# Patient Record
Sex: Male | Born: 1935 | Race: White | Hispanic: No | Smoking: Former smoker
Health system: Southern US, Community
[De-identification: ages and names within clinical notes are randomized; demographics above are authoritative.]

## PROBLEM LIST (undated history)

## (undated) DIAGNOSIS — Z9889 Other specified postprocedural states: Secondary | ICD-10-CM

## (undated) DIAGNOSIS — E872 Acidosis, unspecified: Secondary | ICD-10-CM

## (undated) DIAGNOSIS — E039 Hypothyroidism, unspecified: Secondary | ICD-10-CM

## (undated) DIAGNOSIS — C679 Malignant neoplasm of bladder, unspecified: Secondary | ICD-10-CM

## (undated) DIAGNOSIS — N189 Chronic kidney disease, unspecified: Secondary | ICD-10-CM

## (undated) DIAGNOSIS — I2510 Atherosclerotic heart disease of native coronary artery without angina pectoris: Secondary | ICD-10-CM

## (undated) DIAGNOSIS — K567 Ileus, unspecified: Secondary | ICD-10-CM

## (undated) DIAGNOSIS — R195 Other fecal abnormalities: Secondary | ICD-10-CM

## (undated) DIAGNOSIS — D649 Anemia, unspecified: Secondary | ICD-10-CM

## (undated) DIAGNOSIS — N400 Enlarged prostate without lower urinary tract symptoms: Secondary | ICD-10-CM

## (undated) DIAGNOSIS — M1990 Unspecified osteoarthritis, unspecified site: Secondary | ICD-10-CM

## (undated) DIAGNOSIS — T4145XA Adverse effect of unspecified anesthetic, initial encounter: Secondary | ICD-10-CM

## (undated) DIAGNOSIS — E889 Metabolic disorder, unspecified: Secondary | ICD-10-CM

## (undated) DIAGNOSIS — T8859XA Other complications of anesthesia, initial encounter: Secondary | ICD-10-CM

## (undated) DIAGNOSIS — T8452XA Infection and inflammatory reaction due to internal left hip prosthesis, initial encounter: Secondary | ICD-10-CM

## (undated) DIAGNOSIS — I219 Acute myocardial infarction, unspecified: Secondary | ICD-10-CM

## (undated) DIAGNOSIS — R011 Cardiac murmur, unspecified: Secondary | ICD-10-CM

## (undated) DIAGNOSIS — M898X9 Other specified disorders of bone, unspecified site: Secondary | ICD-10-CM

## (undated) DIAGNOSIS — Z5189 Encounter for other specified aftercare: Secondary | ICD-10-CM

## (undated) DIAGNOSIS — C801 Malignant (primary) neoplasm, unspecified: Secondary | ICD-10-CM

## (undated) HISTORY — DX: Infection and inflammatory reaction due to internal left hip prosthesis, initial encounter: T84.52XA

## (undated) HISTORY — DX: Other fecal abnormalities: R19.5

## (undated) HISTORY — PX: CATARACT EXTRACTION W/ INTRAOCULAR LENS  IMPLANT, BILATERAL: SHX1307

## (undated) HISTORY — DX: Other specified postprocedural states: Z98.890

## (undated) HISTORY — DX: Benign prostatic hyperplasia without lower urinary tract symptoms: N40.0

## (undated) HISTORY — PX: CHOLECYSTECTOMY: SHX55

## (undated) HISTORY — PX: CORONARY ANGIOPLASTY: SHX604

---

## 1997-07-26 ENCOUNTER — Other Ambulatory Visit: Admission: RE | Admit: 1997-07-26 | Discharge: 1997-07-26 | Payer: Self-pay | Admitting: Cardiology

## 1998-12-30 ENCOUNTER — Ambulatory Visit (HOSPITAL_COMMUNITY): Admission: RE | Admit: 1998-12-30 | Discharge: 1998-12-30 | Payer: Self-pay | Admitting: Family Medicine

## 1998-12-30 ENCOUNTER — Encounter: Payer: Self-pay | Admitting: Family Medicine

## 1999-01-30 DIAGNOSIS — K567 Ileus, unspecified: Secondary | ICD-10-CM

## 1999-01-30 HISTORY — DX: Ileus, unspecified: K56.7

## 1999-05-24 ENCOUNTER — Encounter: Payer: Self-pay | Admitting: Orthopedic Surgery

## 1999-05-29 ENCOUNTER — Encounter: Payer: Self-pay | Admitting: Orthopedic Surgery

## 1999-05-29 ENCOUNTER — Inpatient Hospital Stay (HOSPITAL_COMMUNITY): Admission: RE | Admit: 1999-05-29 | Discharge: 1999-06-06 | Payer: Self-pay | Admitting: Orthopedic Surgery

## 1999-05-30 ENCOUNTER — Encounter: Payer: Self-pay | Admitting: Orthopedic Surgery

## 1999-06-01 ENCOUNTER — Encounter: Payer: Self-pay | Admitting: Internal Medicine

## 1999-06-03 ENCOUNTER — Encounter: Payer: Self-pay | Admitting: Orthopedic Surgery

## 1999-06-05 ENCOUNTER — Encounter: Payer: Self-pay | Admitting: Orthopedic Surgery

## 2000-04-09 ENCOUNTER — Encounter: Payer: Self-pay | Admitting: Orthopedic Surgery

## 2000-04-15 ENCOUNTER — Encounter: Payer: Self-pay | Admitting: Orthopedic Surgery

## 2000-04-15 ENCOUNTER — Inpatient Hospital Stay (HOSPITAL_COMMUNITY): Admission: RE | Admit: 2000-04-15 | Discharge: 2000-04-20 | Payer: Self-pay | Admitting: Orthopedic Surgery

## 2003-01-30 HISTORY — PX: INGUINAL HERNIA REPAIR: SHX194

## 2006-01-29 DIAGNOSIS — Z5189 Encounter for other specified aftercare: Secondary | ICD-10-CM

## 2006-01-29 DIAGNOSIS — IMO0001 Reserved for inherently not codable concepts without codable children: Secondary | ICD-10-CM

## 2006-01-29 HISTORY — PX: BLADDER SURGERY: SHX569

## 2006-01-29 HISTORY — DX: Reserved for inherently not codable concepts without codable children: IMO0001

## 2006-01-29 HISTORY — DX: Encounter for other specified aftercare: Z51.89

## 2010-07-25 ENCOUNTER — Ambulatory Visit (INDEPENDENT_AMBULATORY_CARE_PROVIDER_SITE_OTHER): Payer: Medicare Other | Admitting: Internal Medicine

## 2010-07-25 DIAGNOSIS — K921 Melena: Secondary | ICD-10-CM

## 2010-07-25 DIAGNOSIS — R112 Nausea with vomiting, unspecified: Secondary | ICD-10-CM

## 2010-08-08 NOTE — Consult Note (Signed)
[name], NABOZNY NO.:  [id number]  MEDICAL RECORD NO.:  [id number]  LOCATION:                                 FACILITY:  PHYSICIAN:  Lionel December, M.D.    DATE OF BIRTH:  [birth year]  DATE OF CONSULTATION:  07/25/2010 DATE OF DISCHARGE:                                CONSULTATION   This is  Clinic for GI disease.  REASON FOR CONSULTATION:  Blood in stool.  HISTORY OF PRESENT ILLNESS:  Casey [name] is a 77 year old male, referred to our office for guaiac-positive stool.  He was recently seen at Dr. Bonnita Levan office in May of this year.  He presented for wellness physical, and it was noted his stool was guaiac positive.  Harnoor also complains of diarrhea off and on for 18 years.  He takes Imodium two a day.  The diarrhea has progressively worsened.  He tells me when he has to go to have a bowel movement, he has to go right then or he will be incontinent.  He has been told in the past that he has a spastic colon. His appetite is good.  There has been no weight loss.  No fatigue.  He usually has four stools a day.  He tells me the first stool is formed and then his stools progressively soften and become loose. He has tried fiber in the past, but this causes his diarrhea to increase.  He has also tried Questran in the past and it helps at first, but then it loses its effect. Casey [name] underwent an EGD and colonoscopy on April 12, 1999 by Dr. Karilyn Cota, the EGD revealed gastritis with antral scar, prepyloric erosions, and a very friable mucosa.  Colonoscopy, he had two cecal ulcers.  Ulcer at the ileocecal valve and also the terminal ileum showed changes of inflammation.  It was suspected these changes could be due to chronic NSAID therapy.  Given these findings of colonoscopy, Crohn disease needs to be considered in the differential diagnosis.  ALLERGIES:  He is allergic to SULFA.  PAST SURGICAL HISTORY:  He had bladder cancer surgery 4 years ago.  He has also has  a ureteral stent.  He has had a right hip replacement in 2001 and a left knee replacement in 2002.  He had angioplasty 14 years ago.  He had a cholecystectomy in 1995 or 1996.  PAST MEDICAL HISTORY:  Bleeding ulcer.  He has had two MIs in the past. He has been a diabetic for 12 years.  FAMILY HISTORY:  His mother is deceased from an MI at age 55.  Father is deceased with a history of an MI, but died from complications of COPD. He has two sisters, one is alive with arthritis, one is deceased from cervical cancer.  No brothers.  SOCIAL HISTORY:  He is widowed.  He is retired.  He does not smoke, drink, or do drugs.  He has three children in good health.  HOME MEDICATIONS: 1. Zetia 10 mg one a day. 2. Avodart 0.5 mg a day. 3. Toprol-XL 50 mg one half tablet daily. 4. Amaryl 4 mg a day. 5. Celebrex 200 mg one a day. 6. Flomax  0.4 mg daily. 7. Aspirin 81 mg a day.  OBJECTIVE:  VITAL SIGNS:  His weight is 182.9, his height is 5 feet 6.5 inches, his temperature is 97.1, his blood pressure is 106/50, his pulse is 72. HEENT:  He has natural teeth.  His oral mucosa is moist.  His conjunctivae is pink.  His sclerae are anicteric.  His thyroid is normal.  There is no cervical lymphadenopathy. LUNGS:  Clear. HEART:  Regular rate and rhythm. ABDOMEN:  Obese.  Bowel sounds are positive.  No masses felt.  He does have a prominent vein to his left abdomen.  I did not feel any masses. Guaiac  negative.  I did not have any stool to guaiac, just the mucosa.  I was unable to palpate his liver.  ASSESSMENT:  Casey [name] is a 77 year old male with a heme-positive stool and diarrhea.  He has history of cecal ulcers. Need to IBD.  RECOMMENDATIONS:  We will schedule a colonoscopy in near future.  The risk and benefits were reviewed with the patient and he is agreeable.   He will take one Imodium a day, and he may try Levsin 0.125 mg sublingual every 6 hours as needed to see if this will help.  Thank you  for allowing [place] to participate in the care of this very nice gentleman.    ______________________________ Dorene Ar, NP   ______________________________ Lionel December, M.D.    TS/MEDQ  D:  07/25/2010  T:  07/26/2010  Job:  [id number]  cc:   Dr. Sherril Croon  Electronically Signed by Dorene Ar PA on 07/28/2010 10:36:42 AM Electronically Signed by Lionel December M.D. on 08/08/2010 09:41:58 PM

## 2010-09-06 DIAGNOSIS — K633 Ulcer of intestine: Secondary | ICD-10-CM

## 2010-09-06 DIAGNOSIS — K921 Melena: Secondary | ICD-10-CM

## 2010-09-06 DIAGNOSIS — K56609 Unspecified intestinal obstruction, unspecified as to partial versus complete obstruction: Secondary | ICD-10-CM

## 2010-09-06 DIAGNOSIS — D126 Benign neoplasm of colon, unspecified: Secondary | ICD-10-CM

## 2010-09-06 DIAGNOSIS — D649 Anemia, unspecified: Secondary | ICD-10-CM

## 2010-09-06 DIAGNOSIS — K5289 Other specified noninfective gastroenteritis and colitis: Secondary | ICD-10-CM

## 2010-09-06 MED ORDER — SODIUM CHLORIDE 0.45 % IV SOLN
Freq: Once | INTRAVENOUS | Status: AC
  Administered 2010-09-07: 13:00:00 via INTRAVENOUS

## 2010-09-07 ENCOUNTER — Encounter (HOSPITAL_COMMUNITY): Payer: Self-pay | Admitting: *Deleted

## 2010-09-07 ENCOUNTER — Encounter (INDEPENDENT_AMBULATORY_CARE_PROVIDER_SITE_OTHER): Payer: Medicare Other | Admitting: Internal Medicine

## 2010-09-07 ENCOUNTER — Ambulatory Visit (HOSPITAL_COMMUNITY)
Admission: RE | Admit: 2010-09-07 | Discharge: 2010-09-07 | Disposition: A | Discharge status: Home / Self Care | Payer: Medicare Other | Source: Ambulatory Visit | Attending: Internal Medicine | Admitting: Internal Medicine

## 2010-09-07 ENCOUNTER — Encounter (HOSPITAL_COMMUNITY)
Admission: RE | Disposition: A | Discharge status: Home / Self Care | Payer: Self-pay | Source: Ambulatory Visit | Attending: Internal Medicine

## 2010-09-07 ENCOUNTER — Other Ambulatory Visit (INDEPENDENT_AMBULATORY_CARE_PROVIDER_SITE_OTHER): Payer: Self-pay | Admitting: Internal Medicine

## 2010-09-07 DIAGNOSIS — K633 Ulcer of intestine: Secondary | ICD-10-CM | POA: Insufficient documentation

## 2010-09-07 DIAGNOSIS — Z79899 Other long term (current) drug therapy: Secondary | ICD-10-CM | POA: Insufficient documentation

## 2010-09-07 DIAGNOSIS — D126 Benign neoplasm of colon, unspecified: Secondary | ICD-10-CM | POA: Insufficient documentation

## 2010-09-07 DIAGNOSIS — Z7982 Long term (current) use of aspirin: Secondary | ICD-10-CM | POA: Insufficient documentation

## 2010-09-07 DIAGNOSIS — D509 Iron deficiency anemia, unspecified: Secondary | ICD-10-CM | POA: Insufficient documentation

## 2010-09-07 DIAGNOSIS — K921 Melena: Secondary | ICD-10-CM | POA: Insufficient documentation

## 2010-09-07 DIAGNOSIS — I10 Essential (primary) hypertension: Secondary | ICD-10-CM | POA: Insufficient documentation

## 2010-09-07 DIAGNOSIS — E119 Type 2 diabetes mellitus without complications: Secondary | ICD-10-CM | POA: Insufficient documentation

## 2010-09-07 HISTORY — DX: Adverse effect of unspecified anesthetic, initial encounter: T41.45XA

## 2010-09-07 HISTORY — PX: COLONOSCOPY: SHX5424

## 2010-09-07 HISTORY — DX: Chronic kidney disease, unspecified: N18.9

## 2010-09-07 HISTORY — DX: Malignant (primary) neoplasm, unspecified: C80.1

## 2010-09-07 HISTORY — DX: Acute myocardial infarction, unspecified: I21.9

## 2010-09-07 HISTORY — DX: Cardiac murmur, unspecified: R01.1

## 2010-09-07 HISTORY — DX: Ileus, unspecified: K56.7

## 2010-09-07 HISTORY — DX: Unspecified osteoarthritis, unspecified site: M19.90

## 2010-09-07 HISTORY — DX: Encounter for other specified aftercare: Z51.89

## 2010-09-07 HISTORY — DX: Other complications of anesthesia, initial encounter: T88.59XA

## 2010-09-07 SURGERY — COLONOSCOPY
Anesthesia: Moderate Sedation

## 2010-09-07 MED ORDER — MEPERIDINE HCL 50 MG/ML IJ SOLN
INTRAMUSCULAR | Status: DC | PRN
  Administered 2010-09-07 (×2): 25 mg via INTRAVENOUS

## 2010-09-07 MED ORDER — MIDAZOLAM HCL 5 MG/5ML IJ SOLN
INTRAMUSCULAR | Status: AC
  Filled 2010-09-07: qty 10

## 2010-09-07 MED ORDER — MEPERIDINE HCL 50 MG/ML IJ SOLN
INTRAMUSCULAR | Status: AC
  Filled 2010-09-07: qty 1

## 2010-09-07 MED ORDER — MIDAZOLAM HCL 5 MG/5ML IJ SOLN
INTRAMUSCULAR | Status: DC | PRN
  Administered 2010-09-07 (×2): 2 mg via INTRAVENOUS

## 2010-09-07 NOTE — Op Note (Signed)
COLONOSCOPY PROCEDURE REPORT  PATIENT:  Casey [name]  MR#:  [id number] Birthdate:  March 01, 1929, 77 y.o., male Endoscopist:  Dr. Malissa Hippo, MD Referred By:  Dr. Dr. Doreen Beam. MD Procedure Date: 09/07/2010  Procedure:   Colonoscopy  Indications: Heme positive stool and mild anemia. Patient also complains of intermittent diarrhea not mentioned in history and physical.  Informed Consent:  Procedure and risks were reviewed with the patient and informed exam was obtained.  Medications:  Demerol 50 mg IV Versed 4 mg IV  Description of procedure:  After a digital rectal exam was performed, that colonoscope was advanced from the anus through the rectum and colon to the area of the cecum, ileocecal valve and appendiceal orifice. The cecum was deeply intubated. These structures were well-seen and photographed for the record. From the level of the cecum and ileocecal valve, the scope was slowly and cautiously withdrawn. The mucosal surfaces were carefully surveyed utilizing scope tip to flexion to facilitate fold flattening as needed. The scope was pulled down into the rectum where a thorough exam including retroflexion was performed. Short segment of terminal ileum was also examined.   Findings:   Prep excellent. Small ulcer involving ileocecal valve along with scarring. 7-8 mm polyp appeared to be originating from terminal ileum. It was snared in 2 pieces. Distal centimeters of ileal mucosa with edema erythema and friability with narrowing. Was not able to pass the scope is segment able to see mucosa above it which was normal. Biopsy taken from  terminal ileum. Mucosa of colon and rectum was normal.  Therapeutic/Diagnostic Maneuvers Performed:  See above.  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 Unable to pass pediatric scope across it. Biopsy taken.  7-8 mm polyp snared from  terminal ileum/ ileocecal valve. Single ulcer in the background of scarring at ileocecal valve. Suspect these changes are secondary to chronic NSAID use. Recommendations:  No aspirin for 10 days. Stop Celebrex and discuss alternatives to Dr. Sherril Croon. Physician will contact you with biopsy results.  Anberlyn Feimster U  09/07/2010 2:17 PM  CC: Dr. Ignatius Specking., MD, MD

## 2010-09-07 NOTE — H&P (Signed)
Casey [name] is an 77 y.o. male.   Chief Complaint: Patient is here for colonoscopy he has heme positive stools and mild anemia HPI: This 77 year old Caucasian male who was noted to have heme positive stool and he also has mild anemia. He states his hemoglobin has been in the mid 12 range for few years. There is no history of melena or rectal bleeding abdominal pain heartburn or dysphagia. He has a poor appetite and has not lost any weight. His last colonoscopy was in 2001 by me revealing the ulcer at ileocecal valve and terminal ileum.to be secondary to NSAID use. He has arthritis and gout and is on Celebrex takes 200 mg daily. Family history is negative for colorectal carcinoma.  Past Medical History  Diagnosis Date  . Complication of anesthesia   . Ileus 2001    post hip replacement  . Heart murmur   . Chronic kidney disease     bladder ca in 2008;  . Cancer     bladder  . Myocardial infarction age 18  . Hypertension   . Blood transfusion 2008  . Arthritis     "all over" all joints  . Diabetes mellitus     Past Surgical History  Procedure Date  . Coronary angioplasty   . Bladder surgery 2008  . Cholecystectomy   . Inguinal hernia repair 2005    History reviewed. No pertinent family history. Social History:  reports that he quit smoking about 20 years ago. He does not have any smokeless tobacco history on file. He reports that he does not drink alcohol or use illicit drugs.  Allergies:  Allergies  Allergen Reactions  . Sulfa Antibiotics     Medications Prior to Admission  Medication Dose Route Frequency Provider Last Rate Last Dose  . 0.45 % sodium chloride infusion   Intravenous Once Malissa Hippo, MD 20 mL/hr at 09/07/10 1310    . meperidine (DEMEROL) 50 MG/ML injection           . midazolam (VERSED) 5 MG/5ML injection            Medications Prior to Admission  Medication Sig Dispense Refill  . aspirin 81 MG EC tablet Take 81 mg by mouth daily.        .  celecoxib (CELEBREX) 200 MG capsule Take 200 mg by mouth 2 (two) times daily.        . diphenhydrAMINE (SOMINEX) 25 MG tablet Take 25 mg by mouth at bedtime as needed.        . dutasteride (AVODART) 0.5 MG capsule Take 0.5 mg by mouth daily.        Marland Kitchen ezetimibe (ZETIA) 10 MG tablet Take 10 mg by mouth daily.        Marland Kitchen glimepiride (AMARYL) 4 MG tablet Take 4 mg by mouth daily before breakfast.        . metoprolol succinate (TOPROL-XL) 25 MG 24 hr tablet Take 25 mg by mouth daily.        . Tamsulosin HCl (FLOMAX) 0.4 MG CAPS Take by mouth.          No results found for this or any previous visit (from the past 48 hour(s)). No results found.  ROS  Blood pressure 141/80, pulse 84, temperature 98.6 F (37 C), temperature source Oral, resp. rate 18, height 5\' 7"  (1.702 m), weight 176 lb (79.833 kg), SpO2 96.00%. Physical Exam  Constitutional: He appears well-developed and well-nourished.  HENT:  Mouth/Throat: Oropharynx is clear  and moist.  Eyes: Conjunctivae are normal. No scleral icterus.  Neck: No thyromegaly present.  Cardiovascular: Normal rate, regular rhythm and normal heart sounds.   No murmur heard. Respiratory: Breath sounds normal.  GI: Soft. He exhibits no distension and no mass. There is no tenderness.  Musculoskeletal: He exhibits no edema.  Lymphadenopathy:    He has no cervical adenopathy.  Neurological: He is alert.  Skin: Skin is warm and dry.     Assessment/Plan Heme positive stools and mild anemia. Patient is here for diagnostic colonoscopy. He has history of ulcers at ileocecal valve and terminal ileum (secondary to NSAID therapy ; last colonoscopy of 2001)  Kirra Verga U 09/07/2010, 1:35 PM

## 2010-09-13 ENCOUNTER — Encounter (INDEPENDENT_AMBULATORY_CARE_PROVIDER_SITE_OTHER): Payer: Self-pay | Admitting: *Deleted

## 2010-09-14 ENCOUNTER — Encounter (HOSPITAL_COMMUNITY): Payer: Self-pay | Admitting: Internal Medicine

## 2010-11-27 ENCOUNTER — Encounter (INDEPENDENT_AMBULATORY_CARE_PROVIDER_SITE_OTHER): Payer: Self-pay | Admitting: *Deleted

## 2010-12-07 ENCOUNTER — Ambulatory Visit (INDEPENDENT_AMBULATORY_CARE_PROVIDER_SITE_OTHER): Payer: Medicare Other | Admitting: Internal Medicine

## 2012-08-11 ENCOUNTER — Ambulatory Visit (INDEPENDENT_AMBULATORY_CARE_PROVIDER_SITE_OTHER): Payer: Medicare Other | Admitting: Internal Medicine

## 2012-08-11 ENCOUNTER — Encounter (INDEPENDENT_AMBULATORY_CARE_PROVIDER_SITE_OTHER): Payer: Self-pay | Admitting: Internal Medicine

## 2012-08-11 VITALS — BP 126/70 | HR 80 | Temp 97.9°F | Ht 66.5 in | Wt 172.9 lb

## 2012-08-11 DIAGNOSIS — E119 Type 2 diabetes mellitus without complications: Secondary | ICD-10-CM

## 2012-08-11 DIAGNOSIS — R197 Diarrhea, unspecified: Secondary | ICD-10-CM

## 2012-08-11 DIAGNOSIS — I25810 Atherosclerosis of coronary artery bypass graft(s) without angina pectoris: Secondary | ICD-10-CM

## 2012-08-11 DIAGNOSIS — E118 Type 2 diabetes mellitus with unspecified complications: Secondary | ICD-10-CM

## 2012-08-11 DIAGNOSIS — E7800 Pure hypercholesterolemia, unspecified: Secondary | ICD-10-CM | POA: Insufficient documentation

## 2012-08-11 HISTORY — DX: Diarrhea, unspecified: R19.7

## 2012-08-11 HISTORY — DX: Atherosclerosis of coronary artery bypass graft(s) without angina pectoris: I25.810

## 2012-08-11 HISTORY — DX: Type 2 diabetes mellitus with unspecified complications: E11.8

## 2012-08-11 HISTORY — DX: Pure hypercholesterolemia, unspecified: E78.00

## 2012-08-11 MED ORDER — DIPHENOXYLATE-ATROPINE 2.5-0.025 MG PO TABS: 1.0000 | ORAL_TABLET | Freq: Four times a day (QID) | ORAL | Status: DC | PRN

## 2012-08-11 NOTE — Patient Instructions (Addendum)
Lomotil # 60. OV prn

## 2012-08-11 NOTE — Progress Notes (Signed)
Subjective:     Patient ID: Casey [name], male   DOB: [birth year], 79 y.o.   MRN: [id number]  HPI Here today with c/o that he had a bug 2 weeks ago and had diarrhea x 2 weeks. He c/o urgency.  He would go to the BR about 13 times at its worse. He was just passing water. He also had a fever. He started [place] yogurt  and states he has not had any diarrhea. He has had 3 stools today which are now better. They is no urgency. No melena or bright red rectal bleeding. He tells me he is taking a big trip and he is going across country. He is asking for an Rx for Lomotil. He tells me Imodium does not help. Appetite is good. No weight loss. No abdominal pain . No melena or bright red rectal bleeding. Diabetes 2 since 2004. HA1C 5.1  Colonoscopy:  8/9/Cecal Withdrawal Time: 25 minutes  Impression:  Focal ileitis with narrowing involving distal 2 cm of terminal ileum. Unable to pass pediatric scope across it. Biopsy taken.  7-8 mm polyp snared from terminal ileum/ ileocecal valve.  Single ulcer in the background of scarring at ileocecal valve.  Suspect these changes are secondary to chronic NSAID use.  Recommendations:  No aspirin for 10 days.  Stop Celebrex and discuss alternatives to Dr. Sherril Croon.  Biopsy:   Notes Recorded by Malissa Hippo, MD on 09/12/2010 at 5:14 PM Biopsy results reviewed with the patient. Polyp is inflammatory. Ileal ulceration without features of Crohn's disease. Suspect this is secondary to NSAID therapy Review of Systems  Current Outpatient Prescriptions  Medication Sig Dispense Refill  . dutasteride (AVODART) 0.5 MG capsule Take 0.5 mg by mouth daily.        Marland Kitchen glimepiride (AMARYL) 4 MG tablet Take 4 mg by mouth daily before breakfast.        . metoprolol succinate (TOPROL-XL) 25 MG 24 hr tablet Take 25 mg by mouth daily. 1/2 tab daily      . Multiple Vitamin (MULTIVITAMIN) tablet Take 1 tablet by mouth daily.      . Tamsulosin HCl (FLOMAX) 0.4 MG CAPS Take by mouth.          No current facility-administered medications for this visit.   Past Medical History  Diagnosis Date  . Complication of anesthesia   . Ileus 2001    post hip replacement  . Heart murmur   . Chronic kidney disease     bladder ca in 2008;  . Cancer     bladder  . Myocardial infarction age 30  . Blood transfusion 2008  . Arthritis     "all over" all joints  . Diabetes mellitus    Past Surgical History  Procedure Laterality Date  . Coronary angioplasty    . Bladder surgery  2008  . Cholecystectomy    . Inguinal hernia repair  2005  . Colonoscopy  09/07/2010    Procedure: COLONOSCOPY;  Surgeon: Malissa Hippo, MD;  Location: [facility];  Service: Endoscopy;  Laterality: N/A;   Allergies  Allergen Reactions  . Sulfa Antibiotics   '      Objective:   Physical Exam  Filed Vitals:   08/11/12 1448  BP: 126/70  Pulse: 80  Temp: 97.9 F (36.6 C)  Height: 5' 6.5" (1.689 m)  Weight: 172 lb 14.4 oz (78.427 kg)   Alert and oriented. Skin warm and dry. Oral mucosa is moist.   . Sclera  anicteric, conjunctivae is pink. Thyroid not enlarged. No cervical lymphadenopathy. Lungs clear. Heart regular rate and rhythm.  Abdomen is soft. Bowel sounds are positive. No hepatomegaly. No abdominal masses felt. No tenderness.  No edema to lower extremities.      Assessment:    Viral gastroenteritis resolved at this time. No fever.    Plan:    Lomitil # 60 every 12 hrs as needed. OV prn

## 2014-09-14 ENCOUNTER — Other Ambulatory Visit: Payer: Self-pay | Admitting: Physical Medicine and Rehabilitation

## 2014-09-14 DIAGNOSIS — M545 Low back pain: Secondary | ICD-10-CM

## 2014-09-23 ENCOUNTER — Ambulatory Visit
Admission: RE | Admit: 2014-09-23 | Discharge: 2014-09-23 | Disposition: A | Discharge status: Home / Self Care | Payer: Medicare Other | Source: Ambulatory Visit | Attending: Physical Medicine and Rehabilitation | Admitting: Physical Medicine and Rehabilitation

## 2014-09-23 DIAGNOSIS — M545 Low back pain: Secondary | ICD-10-CM

## 2015-06-21 ENCOUNTER — Encounter (INDEPENDENT_AMBULATORY_CARE_PROVIDER_SITE_OTHER): Payer: Self-pay | Admitting: *Deleted

## 2015-07-12 ENCOUNTER — Ambulatory Visit (INDEPENDENT_AMBULATORY_CARE_PROVIDER_SITE_OTHER): Payer: Medicare Other | Admitting: Internal Medicine

## 2015-10-17 ENCOUNTER — Ambulatory Visit (HOSPITAL_COMMUNITY)
Admission: RE | Admit: 2015-10-17 | Discharge: 2015-10-17 | Disposition: A | Discharge status: Home / Self Care | Payer: Medicare Other | Source: Ambulatory Visit | Attending: Surgery | Admitting: Surgery

## 2015-10-17 ENCOUNTER — Other Ambulatory Visit (HOSPITAL_COMMUNITY): Payer: Self-pay | Admitting: Nephrology

## 2015-10-17 DIAGNOSIS — I701 Atherosclerosis of renal artery: Secondary | ICD-10-CM | POA: Diagnosis not present

## 2015-10-17 DIAGNOSIS — N183 Chronic kidney disease, stage 3 (moderate): Secondary | ICD-10-CM | POA: Diagnosis not present

## 2017-04-23 ENCOUNTER — Telehealth (INDEPENDENT_AMBULATORY_CARE_PROVIDER_SITE_OTHER): Payer: Self-pay | Admitting: Physical Medicine and Rehabilitation

## 2017-04-23 NOTE — Telephone Encounter (Signed)
Called patient and left msg #1.

## 2017-05-07 NOTE — Telephone Encounter (Signed)
Will await call from patient.

## 2017-06-28 ENCOUNTER — Telehealth (INDEPENDENT_AMBULATORY_CARE_PROVIDER_SITE_OTHER): Payer: Self-pay | Admitting: *Deleted

## 2017-06-28 ENCOUNTER — Ambulatory Visit (INDEPENDENT_AMBULATORY_CARE_PROVIDER_SITE_OTHER): Payer: Medicare Other | Admitting: Internal Medicine

## 2017-06-28 ENCOUNTER — Encounter (INDEPENDENT_AMBULATORY_CARE_PROVIDER_SITE_OTHER): Payer: Self-pay | Admitting: Internal Medicine

## 2017-06-28 ENCOUNTER — Encounter (INDEPENDENT_AMBULATORY_CARE_PROVIDER_SITE_OTHER): Payer: Self-pay | Admitting: *Deleted

## 2017-06-28 DIAGNOSIS — R195 Other fecal abnormalities: Secondary | ICD-10-CM

## 2017-06-28 HISTORY — DX: Other fecal abnormalities: R19.5

## 2017-06-28 MED ORDER — PEG 3350-KCL-NA BICARB-NACL 420 G PO SOLR: 4000.0000 mL | Freq: Once | ORAL | 0 refills | Status: AC

## 2017-06-28 NOTE — Patient Instructions (Signed)
The risks of bleeding, perforation and infection were reviewed with patient.  

## 2017-06-28 NOTE — Telephone Encounter (Signed)
Patient needs trilyte 

## 2017-06-28 NOTE — Progress Notes (Signed)
   Subjective:    Patient ID: Casey [name], male    DOB: Jun 03, 1929, 84 y.o.   MRN: [id number]  HPI Referred by Dr. Woody Seller for positive stool card/colonoscopy. Denies seeing any blood in his stool. No weight loss. No family hx of colon cancer. Appetite is good. No weight loss. Last colonoscopy in 2012. No change in his bowel habits.  No NSAIDs except for ASA twice a week (81mg )   09/06/2010:   Colonoscopy  Indications: Heme positive stool and mild anemia. Patient also complains of intermittent diarrhea not mentioned in history and physical. Impression:  Focal  ileitis with narrowing involving  distal 2 cm of terminal ileum. Unable to pass pediatric scope across it. Biopsy taken.  7-8 mm polyp snared from terminal ileum/ ileocecal valve. Single ulcer in the background of scarring at ileocecal valve. Suspect these changes are secondary to chronic NSAID use.  Review of Systems Past Medical History:  Diagnosis Date  . Arthritis    "all over" all joints  . Blood transfusion 2008  . Cancer ([place])    bladder  . Chronic kidney disease    bladder ca in 2008;  Marland Kitchen Complication of anesthesia   . Diabetes mellitus   . Guaiac + stool 06/28/2017  . Heart murmur   . Ileus ([place]) 2001   post hip replacement  . Myocardial infarction [facility]) age 44    Past Surgical History:  Procedure Laterality Date  . BLADDER SURGERY  2008  . CHOLECYSTECTOMY    . COLONOSCOPY  09/07/2010   Procedure: COLONOSCOPY;  Surgeon: Rogene Houston, MD;  Location: [facility];  Service: Endoscopy;  Laterality: N/A;  . CORONARY ANGIOPLASTY    . INGUINAL HERNIA REPAIR  2005    Allergies  Allergen Reactions  . Sulfa Antibiotics     Current Outpatient Medications on File Prior to Visit  Medication Sig Dispense Refill  . aspirin EC 81 MG tablet Take 81 mg by mouth. Takes one twice a week    . Choline Fenofibrate (FENOFIBRIC ACID) 135 MG CPDR Take by mouth.    . dutasteride (AVODART) 0.5 MG capsule Take 0.5 mg by  mouth daily.      Marland Kitchen glimepiride (AMARYL) 4 MG tablet Take 4 mg by mouth daily before breakfast.      . lisinopril (PRINIVIL,ZESTRIL) 10 MG tablet Take 10 mg by mouth daily.    . metoprolol succinate (TOPROL-XL) 25 MG 24 hr tablet Take 25 mg by mouth daily. 1/2 tab daily    . Multiple Vitamin (MULTIVITAMIN) tablet Take 1 tablet by mouth daily.    . Tamsulosin HCl (FLOMAX) 0.4 MG CAPS Take by mouth.       No current facility-administered medications on file prior to visit.         Objective:   Physical Exam Blood pressure (!) 142/70, pulse 64, temperature 97.6 F (36.4 C), height 5\' 7"  (1.702 m), weight 175 lb (79.4 kg). Alert and oriented. Skin warm and dry. Oral mucosa is moist.   . Sclera anicteric, conjunctivae is pink. Thyroid not enlarged. No cervical lymphadenopathy. Lungs clear. Heart regular rate and rhythm.  Abdomen is soft. Bowel sounds are positive. No hepatomegaly. No abdominal masses felt. No tenderness.  No edema to lower extremities.          Assessment & Plan:  Guaiac positive stool. Colonic neoplasm needs to be ruled out. Colonoscopy. The risks of bleeding, perforation and infection were reviewed with patient.

## 2017-08-21 ENCOUNTER — Ambulatory Visit (HOSPITAL_COMMUNITY): Admit: 2017-08-21 | Payer: Medicare Other | Admitting: Internal Medicine

## 2017-08-21 ENCOUNTER — Encounter (HOSPITAL_COMMUNITY): Payer: Self-pay

## 2017-08-21 SURGERY — COLONOSCOPY
Anesthesia: Moderate Sedation

## 2018-12-03 DIAGNOSIS — M1612 Unilateral primary osteoarthritis, left hip: Secondary | ICD-10-CM

## 2018-12-03 HISTORY — DX: Unilateral primary osteoarthritis, left hip: M16.12

## 2018-12-03 NOTE — H&P (Signed)
HIP ARTHROPLASTY ADMISSION H&P  Patient ID: Casey [name] MRN: [id number] DOB/AGE: May 20, 1929 85 y.o.  Chief Complaint: left hip pain.  Planned Procedure Date: 12/23/2018 Medical Clearance by Dr. Woody Seller     Additional clearance by nephrology: Dr. Lowanda Foster  HPI: Casey [name] is a 85 y.o. male with a history of bladder cancer, CKD, DM, HTN, and MI at 85 years old - status post CABG who presents for evaluation of OA LEFT HIP. The patient has a history of pain and functional disability in the left hip due to arthritis and has failed non-surgical conservative treatments for greater than 12 weeks to include NSAID's and/or analgesics, corticosteriod injections and activity modification.  Onset of symptoms was gradual, starting 1+ years ago with gradually worsening course since that time.  Patient currently rates pain at 8 out of 10 with activity. Patient has night pain, worsening of pain with activity and weight bearing and pain that interferes with activities of daily living.  Patient has evidence of periarticular osteophytes and joint space narrowing by imaging studies.  There is no active infection.  Past Medical History:  Diagnosis Date  . Arthritis    "all over" all joints  . Blood transfusion 2008  . Cancer ([place])    bladder  . Chronic kidney disease    bladder ca in 2008;  Marland Kitchen Complication of anesthesia   . Diabetes mellitus   . Guaiac + stool 06/28/2017  . Heart murmur   . Ileus ([place]) 2001   post hip replacement  . Myocardial infarction [facility]) age 35   Past Surgical History:  Procedure Laterality Date  . BLADDER SURGERY  2008  . CHOLECYSTECTOMY    . COLONOSCOPY  09/07/2010   Procedure: COLONOSCOPY;  Surgeon: Rogene Houston, MD;  Location[facility];  Service: Endoscopy;  Laterality: N/A;  . CORONARY ANGIOPLASTY    . INGUINAL HERNIA REPAIR  2005   Allergies  Allergen Reactions  . Sulfa Antibiotics    Prior to Admission medications   Medication Sig Start Date End Date  Taking? Authorizing Provider  aspirin EC 81 MG tablet Take 81 mg by mouth. Takes one twice a week    [provider]  Choline Fenofibrate (FENOFIBRIC ACID) 135 MG CPDR Take by mouth.    [provider]  dutasteride (AVODART) 0.5 MG capsule Take 0.5 mg by mouth daily.      [provider]  glimepiride (AMARYL) 4 MG tablet Take 4 mg by mouth daily before breakfast.      [provider]  lisinopril (PRINIVIL,ZESTRIL) 10 MG tablet Take 10 mg by mouth daily.    [provider]  metoprolol succinate (TOPROL-XL) 25 MG 24 hr tablet Take 25 mg by mouth daily. 1/2 tab daily    [provider]  Multiple Vitamin (MULTIVITAMIN) tablet Take 1 tablet by mouth daily.    [provider]  Tamsulosin HCl (FLOMAX) 0.4 MG CAPS Take by mouth.      [provider]   Social history: Widowed former smoker-quit 1992.  Occasional beer.  Family history: Mother with MI, HTN, DM.  Sister w/ Cancer.   Grandparents w/ heart dz and DM.  ROS: Currently denies lightheadedness, dizziness, Fever, chills, CP, SOB.   No personal history of DVT, PE, or CVA. No loose teeth or dentures All other systems have been reviewed and were otherwise currently negative with the exception of those mentioned in the HPI and as above.  Objective: Vitals: Ht: 5'3" Wt: 167  Temp: 97.6 BP: 163/70 Pulse: 67 O2 97% on room air.   Physical Exam: General: Alert, NAD. Trendelenberg Gait  HEENT: EOMI, Good Neck Extension  Pulm: No increased work of breathing.  Clear B/L A/P w/o crackle or wheeze.  CV: RRR, No m/g/r appreciated  GI: soft, NT, ND Neuro: Neuro without gross focal deficit.  Sensation intact distally Skin: No lesions in the area of chief complaint MSK/Surgical Site: Left Hip pain with passive ROM.  Positive Stinchfield.  5/5 strength.  NVI.    Imaging Review Plain radiographs demonstrate severe degenerative joint disease of the left hip.   Preoperative templating  of the joint replacement has been completed, documented, and submitted to the Operating Room personnel in order to optimize intra-operative equipment management.  Assessment: OA LEFT HIP Principal Problem:   Primary osteoarthritis of left hip Active Problems:   CAD (coronary artery disease) of artery bypass graft   Diabetes (HCC)   High cholesterol   Plan: Plan for Procedure(s): TOTAL HIP ARTHROPLASTY ANTERIOR APPROACH  The patient history, physical exam, clinical judgement of the provider and imaging are consistent with end stage degenerative joint disease and total joint arthroplasty is deemed medically necessary. The treatment options including medical management, injection therapy, and arthroplasty were discussed at length. The risks and benefits of Procedure(s): TOTAL HIP ARTHROPLASTY ANTERIOR APPROACH were presented and reviewed.  The risks of nonoperative treatment, versus surgical intervention including but not limited to continued pain, aseptic loosening, stiffness, dislocation/subluxation, infection, bleeding, nerve injury, blood clots, cardiopulmonary complications, morbidity, mortality, among others were discussed. The patient verbalizes understanding and wishes to proceed with the plan.  Patient is being admitted for surgery, pain control, PT, prophylactic antibiotics, VTE prophylaxis, progressive ambulation, ADL's and discharge planning.   Dental prophylaxis discussed and recommended for 2 years postoperatively.   The patient does  meet the criteria for TXA which will be used perioperatively.    ASA 81 mg BID will be used postoperatively for DVT prophylaxis in addition to SCDs, and early ambulation.  Plan for Norco for pain.  No NSAID due CKD. Baclofen for spasm.    The patient is planning to be discharged home with HHPT ([facility]) in care of his daughter.   Anticipated LOS less than 2 midnights.  Insurance approval for inpatient due to: - Age 73 and older with one or  more of the following:  - Expected need for hospital services (PT, OT, Nursing) required for safe  discharge    - Active co-morbidities: Diabetes and Coronary Artery Disease   Prudencio Burly III, PA-C 12/03/2018 3:13 PM

## 2018-12-11 ENCOUNTER — Encounter (HOSPITAL_COMMUNITY): Payer: Self-pay

## 2018-12-11 NOTE — Patient Instructions (Addendum)
DUE TO COVID-19 ONLY ONE VISITOR IS ALLOWED TO COME WITH YOU AND STAY IN THE WAITING ROOM ONLY DURING PRE OP AND PROCEDURE. THE ONE VISITOR MAY VISIT WITH YOU IN YOUR PRIVATE ROOM DURING VISITING HOURS ONLY!!   COVID SWAB TESTING MUST BE COMPLETED ON: Friday, Nov. 20, 2020 at   11:40 AM   [address] [place], [place] - the short stay covered drive at [facility] (Use the [employer] entrance to [facility] next to [facility] (Must self quarantine after testing. Follow instructions on handout.)       Your procedure is scheduled on: Tuesday, Nov. 24, 2020   Report to [facility] Main  Entrance   Report to Short Stay at 5:30 AM   Call this number if you have problems the morning of surgery [phone]   Do not eat food:After Midnight.   May have liquids until 4:30 AM day of surgery   CLEAR LIQUID DIET  Foods Allowed                                                                     Foods Excluded  Water, Black Coffee and tea, regular and decaf                             liquids that you cannot  Plain Jell-O in any flavor  (No red)                                           see through such as: Fruit ices (not with fruit pulp)                                     milk, soups, orange juice  Iced Popsicles (No red)                                    All solid food Carbonated beverages, regular and diet                                    Apple juices Sports drinks like Gatorade (No red) Lightly seasoned clear broth or consume(fat free) Sugar, honey syrup  Sample Menu Breakfast                                Lunch                                     Supper Cranberry juice                    Beef broth  Chicken broth Jell-O                                     Grape juice                           Apple juice Coffee or tea                        Jell-O                                      Popsicle                                                 Coffee or tea                        Coffee or tea   Complete one G2 drink the morning of surgery at 4:30 AM the day of surgery.   Brush your teeth the morning of surgery.   Do NOT smoke after Midnight   Take these medicines the morning of surgery with A SIP OF WATER: Finasteride, Synthroid, Metoprolol, Tamsulosin,   Monday, Nov. 23, 2020 Take morning dose of Glimepiride per normal routine  DO NOT TAKE ANY DIABETIC MEDICATIONS DAY OF YOUR SURGERY                               You may not have any metal on your body including jewelry, and body piercings             Do not wear lotions, powders, perfumes/cologne, or deodorant                           Men may shave face and neck.   Do not bring valuables to the hospital. [place].   Contacts, dentures or bridgework may not be worn into surgery.   Bring small overnight bag day of surgery.    Special Instructions: Bring a copy of your healthcare power of attorney and living will documents         the day of surgery if you haven't scanned them in before.              Please read over the following fact sheets you were given:  [facility] Main - Preparing for Surgery Before surgery, you can play an important role.  Because skin is not sterile, your skin needs to be as free of germs as possible.  You can reduce the number of germs on your skin by washing with CHG (chlorahexidine gluconate) soap before surgery.  CHG is an antiseptic cleaner which kills germs and bonds with the skin to continue killing germs even after washing. Please DO NOT use if you have an allergy to CHG or antibacterial soaps.  If your skin becomes reddened/irritated stop using the CHG and inform your nurse when you arrive at Short  Stay. Do not shave (including legs and underarms) for at least 48 hours prior to the first CHG shower.  You may shave your face/neck.  Please follow these instructions  carefully:  1.  Shower with CHG Soap the night before surgery and the  morning of surgery.  2.  If you choose to wash your hair, wash your hair first as usual with your normal  shampoo.  3.  After you shampoo, rinse your hair and body thoroughly to remove the shampoo.                             4.  Use CHG as you would any other liquid soap.  You can apply chg directly to the skin and wash.  Gently with a scrungie or clean washcloth.  5.  Apply the CHG Soap to your body ONLY FROM THE NECK DOWN.   Do   not use on face/ open                           Wound or open sores. Avoid contact with eyes, ears mouth and   genitals (private parts).                       Wash face,  Genitals (private parts) with your normal soap.             6.  Wash thoroughly, paying special attention to the area where your    surgery  will be performed.  7.  Thoroughly rinse your body with warm water from the neck down.  8.  DO NOT shower/wash with your normal soap after using and rinsing off the CHG Soap.                9.  Pat yourself dry with a clean towel.            10.  Wear clean pajamas.            11.  Place clean sheets on your bed the night of your first shower and do not  sleep with pets. Day of Surgery : Do not apply any lotions/deodorants the morning of surgery.  Please wear clean clothes to the hospital/surgery center.  FAILURE TO FOLLOW THESE INSTRUCTIONS MAY RESULT IN THE CANCELLATION OF YOUR SURGERY  PATIENT SIGNATURE_________________________________  NURSE SIGNATURE__________________________________  ________________________________________________________________________    Casey [name]  An incentive spirometer is a tool that can help keep your lungs clear and active. This tool measures how well you are filling your lungs with each breath. Taking long deep breaths may help reverse or decrease the chance of developing breathing (pulmonary) problems (especially infection) following:  A  long period of time when you are unable to move or be active. BEFORE THE PROCEDURE   If the spirometer includes an indicator to show your best effort, your nurse or respiratory therapist will set it to a desired goal.  If possible, sit up straight or lean slightly forward. Try not to slouch.  Hold the incentive spirometer in an upright position. INSTRUCTIONS FOR USE  1. Sit on the edge of your bed if possible, or sit up as far as you can in bed or on a chair. 2. Hold the incentive spirometer in an upright position. 3. Breathe out normally. 4. Place the mouthpiece in your mouth and seal your lips tightly around it. 5. Breathe  in slowly and as deeply as possible, raising the piston or the ball toward the top of the column. 6. Hold your breath for 3-5 seconds or for as long as possible. Allow the piston or ball to fall to the bottom of the column. 7. Remove the mouthpiece from your mouth and breathe out normally. 8. Rest for a few seconds and repeat Steps 1 through 7 at least 10 times every 1-2 hours when you are awake. Take your time and take a few normal breaths between deep breaths. 9. The spirometer may include an indicator to show your best effort. Use the indicator as a goal to work toward during each repetition. 10. After each set of 10 deep breaths, practice coughing to be sure your lungs are clear. If you have an incision (the cut made at the time of surgery), support your incision when coughing by placing a pillow or rolled up towels firmly against it. Once you are able to get out of bed, walk around indoors and cough well. You may stop using the incentive spirometer when instructed by your caregiver.  RISKS AND COMPLICATIONS  Take your time so you do not get dizzy or light-headed.  If you are in pain, you may need to take or ask for pain medication before doing incentive spirometry. It is harder to take a deep breath if you are having pain. AFTER USE  Rest and breathe slowly and  easily.  It can be helpful to keep track of a log of your progress. Your caregiver can provide you with a simple table to help with this. If you are using the spirometer at home, follow these instructions: [place] IF:   You are having difficultly using the spirometer.  You have trouble using the spirometer as often as instructed.  Your pain medication is not giving enough relief while using the spirometer.  You develop fever of 100.5 F (38.1 C) or higher. SEEK IMMEDIATE MEDICAL CARE IF:   You cough up bloody sputum that had not been present before.  You develop fever of 102 F (38.9 C) or greater.  You develop worsening pain at or near the incision site. MAKE SURE YOU:   Understand these instructions.  Will watch your condition.  Will get help right away if you are not doing well or get worse. Document Released: 05/28/2006 Document Revised: 04/09/2011 Document Reviewed: 07/29/2006 ExitCare Patient Information 2014 ExitCare, [place].   ________________________________________________________________________ How to Manage Your Diabetes Before and After Surgery  Why is it important to control my blood sugar before and after surgery? . Improving blood sugar levels before and after surgery helps healing and can limit problems. . A way of improving blood sugar control is eating a healthy diet by: o  Eating less sugar and carbohydrates o  Increasing activity/exercise o  Talking with your doctor about reaching your blood sugar goals . High blood sugars (greater than 180 mg/dL) can raise your risk of infections and slow your recovery, so you will need to focus on controlling your diabetes during the weeks before surgery. . Make sure that the doctor who takes care of your diabetes knows about your planned surgery including the date and location.  How do I manage my blood sugar before surgery? . Check your blood sugar at least 4 times a day, starting 2 days before  surgery, to make sure that the level is not too high or low. o Check your blood sugar the morning of your surgery when you  wake up and every 2 hours until you get to the Short Stay unit. . If your blood sugar is less than 70 mg/dL, you will need to treat for low blood sugar: o Do not take insulin. o Treat a low blood sugar (less than 70 mg/dL) with  cup of clear juice (cranberry or apple), 4 glucose tablets, OR glucose gel. o Recheck blood sugar in 15 minutes after treatment (to make sure it is greater than 70 mg/dL). If your blood sugar is not greater than 70 mg/dL on recheck, call [phone] for further instructions. . Report your blood sugar to the short stay nurse when you get to Short Stay.  . If you are admitted to the hospital after surgery: o Your blood sugar will be checked by the staff and you will probably be given insulin after surgery (instead of oral diabetes medicines) to make sure you have good blood sugar levels. o The goal for blood sugar control after surgery is 80-180 mg/dL.   WHAT DO I DO ABOUT MY DIABETES MEDICATION?  Marland Kitchen Do not take oral diabetes medicines (pills) the morning of surgery.   Reviewed and Endorsed by [facility] Patient Education Committee, August 2015

## 2018-12-15 ENCOUNTER — Other Ambulatory Visit: Payer: Self-pay

## 2018-12-15 ENCOUNTER — Encounter (HOSPITAL_COMMUNITY): Payer: Self-pay

## 2018-12-15 ENCOUNTER — Encounter (HOSPITAL_COMMUNITY)
Admission: RE | Admit: 2018-12-15 | Discharge: 2018-12-15 | Disposition: A | Discharge status: Home / Self Care | Payer: Medicare Other | Source: Ambulatory Visit | Attending: Orthopedic Surgery | Admitting: Orthopedic Surgery

## 2018-12-15 DIAGNOSIS — Z79890 Hormone replacement therapy (postmenopausal): Secondary | ICD-10-CM | POA: Diagnosis not present

## 2018-12-15 DIAGNOSIS — M1612 Unilateral primary osteoarthritis, left hip: Secondary | ICD-10-CM | POA: Insufficient documentation

## 2018-12-15 DIAGNOSIS — Z9049 Acquired absence of other specified parts of digestive tract: Secondary | ICD-10-CM | POA: Insufficient documentation

## 2018-12-15 DIAGNOSIS — Z87891 Personal history of nicotine dependence: Secondary | ICD-10-CM | POA: Diagnosis not present

## 2018-12-15 DIAGNOSIS — Z8551 Personal history of malignant neoplasm of bladder: Secondary | ICD-10-CM | POA: Insufficient documentation

## 2018-12-15 DIAGNOSIS — Z96649 Presence of unspecified artificial hip joint: Secondary | ICD-10-CM | POA: Insufficient documentation

## 2018-12-15 DIAGNOSIS — I252 Old myocardial infarction: Secondary | ICD-10-CM | POA: Diagnosis not present

## 2018-12-15 DIAGNOSIS — Z01818 Encounter for other preprocedural examination: Secondary | ICD-10-CM | POA: Diagnosis not present

## 2018-12-15 DIAGNOSIS — E039 Hypothyroidism, unspecified: Secondary | ICD-10-CM | POA: Insufficient documentation

## 2018-12-15 DIAGNOSIS — Z79899 Other long term (current) drug therapy: Secondary | ICD-10-CM | POA: Diagnosis not present

## 2018-12-15 DIAGNOSIS — I2510 Atherosclerotic heart disease of native coronary artery without angina pectoris: Secondary | ICD-10-CM | POA: Diagnosis not present

## 2018-12-15 DIAGNOSIS — N183 Chronic kidney disease, stage 3 unspecified: Secondary | ICD-10-CM | POA: Diagnosis not present

## 2018-12-15 DIAGNOSIS — R9431 Abnormal electrocardiogram [ECG] [EKG]: Secondary | ICD-10-CM | POA: Insufficient documentation

## 2018-12-15 DIAGNOSIS — E1122 Type 2 diabetes mellitus with diabetic chronic kidney disease: Secondary | ICD-10-CM | POA: Insufficient documentation

## 2018-12-15 DIAGNOSIS — Z7982 Long term (current) use of aspirin: Secondary | ICD-10-CM | POA: Diagnosis not present

## 2018-12-15 HISTORY — DX: Hypothyroidism, unspecified: E03.9

## 2018-12-15 HISTORY — DX: Acidosis, unspecified: E87.20

## 2018-12-15 HISTORY — DX: Atherosclerotic heart disease of native coronary artery without angina pectoris: I25.10

## 2018-12-15 HISTORY — DX: Unspecified osteoarthritis, unspecified site: M19.90

## 2018-12-15 HISTORY — DX: Other specified disorders of bone, unspecified site: M89.8X9

## 2018-12-15 HISTORY — DX: Anemia, unspecified: D64.9

## 2018-12-15 HISTORY — DX: Malignant neoplasm of bladder, unspecified: C67.9

## 2018-12-15 HISTORY — DX: Metabolic disorder, unspecified: E88.9

## 2018-12-15 HISTORY — DX: Acidosis: E87.2

## 2018-12-15 LAB — CBC
HCT: 39.7 % (ref 39.0–52.0)
Hemoglobin: 12.6 g/dL — ABNORMAL LOW (ref 13.0–17.0)
MCH: 28.4 pg (ref 26.0–34.0)
MCHC: 31.7 g/dL (ref 30.0–36.0)
MCV: 89.6 fL (ref 80.0–100.0)
Platelets: 298 10*3/uL (ref 150–400)
RBC: 4.43 MIL/uL (ref 4.22–5.81)
RDW: 13.1 % (ref 11.5–15.5)
WBC: 7.2 10*3/uL (ref 4.0–10.5)
nRBC: 0 % (ref 0.0–0.2)

## 2018-12-15 LAB — BASIC METABOLIC PANEL
Anion gap: 9 (ref 5–15)
BUN: 43 mg/dL — ABNORMAL HIGH (ref 8–23)
CO2: 22 mmol/L (ref 22–32)
Calcium: 10 mg/dL (ref 8.9–10.3)
Chloride: 102 mmol/L (ref 98–111)
Creatinine, Ser: 1.75 mg/dL — ABNORMAL HIGH (ref 0.61–1.24)
GFR calc Af Amer: 40 mL/min — ABNORMAL LOW (ref >60)
GFR calc non Af Amer: 34 mL/min — ABNORMAL LOW (ref >60)
Glucose, Bld: 195 mg/dL — ABNORMAL HIGH (ref 70–99)
Potassium: 4.5 mmol/L (ref 3.5–5.1)
Sodium: 133 mmol/L — ABNORMAL LOW (ref 135–145)

## 2018-12-15 LAB — GLUCOSE, CAPILLARY: Glucose-Capillary: 211 mg/dL — ABNORMAL HIGH (ref 70–99)

## 2018-12-15 LAB — SURGICAL PCR SCREEN
MRSA, PCR: NEGATIVE
Staphylococcus aureus: NEGATIVE

## 2018-12-15 NOTE — Pre-Procedure Instructions (Signed)
PCP - Dr. Francesco Runner . Last visit: 11/28/2018 Cardiologist -   Chest x-ray -  EKG -  Stress Test -  ECHO -  Cardiac Cath -   Sleep Study -  CPAP -   Fasting Blood Sugar - 99 Checks Blood Sugar _____ times a day  Blood Thinner Instructions: Aspirin Instructions:Stop on 12/16/2018 Last Dose:12/16/2018  Anesthesia review:   Patient denies shortness of breath, fever, cough and chest pain at PAT appointment   Patient verbalized understanding of instructions that were given to them at the PAT appointment. Patient was also instructed that they will need to review over the PAT instructions again at home before surgery.

## 2018-12-16 NOTE — Progress Notes (Signed)
Anesthesia Chart Review   Case: [id number] Date/Time: 12/23/18 0715   Procedure: TOTAL HIP ARTHROPLASTY ANTERIOR APPROACH (Left Hip)   Anesthesia type: Choice   Pre-op diagnosis: OA LEFT HIP   Location: [facility] ROOM 08 / [facility]   Surgeon: Renette Butters, MD      DISCUSSION: 85 y.o. former smoker (quit 09/07/90) with h/o MI, DM II, CAD, CKD Stage III, bladder cancer, OA left hip scheduled for above procedure 12/23/2018 with Dr. Edmonia Lynch.    CKD Stage III, creatinine stable.  Pt last seen by nephrologist 12/04/2018.  Per OV note, "Preop clearance from nephrological standpoint.  Patient is at moderate risk of acute kidney injury as patient has CKD stage IIIb and is 86 years old.  Would recommend to hold lisinopril a day before and the day after the procedure to avoid acute kidney injury.  Would recommend to keep patient well hydrated during the procedure.  Would recommend not to aim for SBP less than 140 intraoperatively.  Would recommend to avoid NSAIDS/IV contrast/nephrotoxic medications."  Clearance received from PCP, Dr. Woody Seller, 11/28/2018 which states pt is optimized for surgery from a medical and cardiac standpoint (on chart).   Anticipate pt can proceed with planned procedure barring acute status change.   VS: BP (!) 158/88   Pulse (!) 57   Temp 36.4 C (Oral)   Resp 18   Ht 5\' 6"  (1.676 m)   Wt 76.4 kg   SpO2 99%   BMI 27.18 kg/m   PROVIDERS: Glenda Chroman, MD is PCP   Ulice Bold, MD is Nephrologist  LABS: Labs reviewed: Acceptable for surgery. (all labs ordered are listed, but only abnormal results are displayed)  Labs Reviewed  BASIC METABOLIC PANEL - Abnormal; Notable for the following components:      Result Value   Sodium 133 (*)    Glucose, Bld 195 (*)    BUN 43 (*)    Creatinine, Ser 1.75 (*)    GFR calc non Af Amer 34 (*)    GFR calc Af Amer 40 (*)    All other components within normal limits  CBC - Abnormal; Notable for the following components:   Hemoglobin 12.6 (*)    All other components within normal limits  GLUCOSE, CAPILLARY - Abnormal; Notable for the following components:   Glucose-Capillary 211 (*)    All other components within normal limits  SURGICAL PCR SCREEN     IMAGES:   EKG: 12/15/2018 Rate 98 bpm Sinus rhythm with PACs Anterior infarct , age undetermined Abnormal ECG No significant change since last tracing  CV:  Past Medical History:  Diagnosis Date  . Anemia   . Arthritis    "all over" all joints  . Bladder cancer (HCC)    bladder  . Blood transfusion 2008  . Chronic kidney disease    bladder ca in 2008;Kid. failure stage 3  . Complication of anesthesia    [place] post op  . Coronary artery disease   . Diabetes mellitus   . DJD (degenerative joint disease)   . Guaiac + stool 06/28/2017  . Heart murmur   . Hypothyroidism    hyper thyroidism recently  . Ileus ([place]) 2001   post hip replacement  . Metabolic acidosis   . Metabolic bone disease   . Myocardial infarction [facility]) age 13    Past Surgical History:  Procedure Laterality Date  . BLADDER SURGERY  2008  . CATARACT EXTRACTION W/ INTRAOCULAR LENS  IMPLANT, BILATERAL  Bilateral   . CHOLECYSTECTOMY    . COLONOSCOPY  09/07/2010   Procedure: COLONOSCOPY;  Surgeon: Rogene Houston, MD;  Location: [facility];  Service: Endoscopy;  Laterality: N/A;  . CORONARY ANGIOPLASTY    . INGUINAL HERNIA REPAIR  2005    MEDICATIONS: . aspirin EC 81 MG tablet  . Choline Fenofibrate (FENOFIBRIC ACID) 135 MG CPDR  . finasteride (PROSCAR) 5 MG tablet  . glimepiride (AMARYL) 4 MG tablet  . levothyroxine (SYNTHROID) 25 MCG tablet  . lisinopril (ZESTRIL) 2.5 MG tablet  . metoprolol succinate (TOPROL-XL) 25 MG 24 hr tablet  . Multiple Vitamin (MULTIVITAMIN) tablet  . sodium bicarbonate 650 MG tablet  . Tamsulosin HCl (FLOMAX) 0.4 MG CAPS   No current facility-administered medications for this encounter.     Maia Plan [facility] Pre-Surgical  Testing [phone] 12/18/18  9:17 AM

## 2018-12-18 NOTE — Anesthesia Preprocedure Evaluation (Addendum)
Anesthesia Evaluation  Patient identified by MRN, date of birth, ID band Patient awake    Reviewed: Allergy & Precautions, NPO status , Patient's Chart, lab work & pertinent test results  Airway Mallampati: II  TM Distance: >3 FB Neck ROM: Full    Dental  (+) Chipped,    Pulmonary former smoker,    Pulmonary exam normal        Cardiovascular + CAD   Rhythm:Regular Rate:Normal     Neuro/Psych negative neurological ROS  negative psych ROS   GI/Hepatic negative GI ROS, Neg liver ROS,   Endo/Other  diabetesHypothyroidism   Renal/GU Renal InsufficiencyRenal disease     Musculoskeletal  (+) Arthritis ,   Abdominal Normal abdominal exam  (+)   Peds  Hematology negative hematology ROS (+)   Anesthesia Other Findings   Reproductive/Obstetrics                           Anesthesia Physical Anesthesia Plan  ASA: III  Anesthesia Plan: Spinal   Post-op Pain Management:    Induction: Intravenous  PONV Risk Score and Plan: 2 and Ondansetron and Propofol infusion  Airway Management Planned: Natural Airway and Simple Face Mask  Additional Equipment: None  Intra-op Plan:   Post-operative Plan:   Informed Consent: I have reviewed the patients History and Physical, chart, labs and discussed the procedure including the risks, benefits and alternatives for the proposed anesthesia with the patient or authorized representative who has indicated his/her understanding and acceptance.       Plan Discussed with: CRNA  Anesthesia Plan Comments: (See PAT note 12/15/2018, Konrad Felix, PA-C)      Anesthesia Quick Evaluation

## 2018-12-19 ENCOUNTER — Other Ambulatory Visit (HOSPITAL_COMMUNITY)
Admission: RE | Admit: 2018-12-19 | Discharge: 2018-12-19 | Disposition: A | Discharge status: Home / Self Care | Payer: Medicare Other | Source: Ambulatory Visit | Attending: Orthopedic Surgery | Admitting: Orthopedic Surgery

## 2018-12-19 ENCOUNTER — Other Ambulatory Visit: Payer: Self-pay

## 2018-12-19 DIAGNOSIS — Z20828 Contact with and (suspected) exposure to other viral communicable diseases: Secondary | ICD-10-CM | POA: Insufficient documentation

## 2018-12-19 DIAGNOSIS — Z01812 Encounter for preprocedural laboratory examination: Secondary | ICD-10-CM | POA: Insufficient documentation

## 2018-12-19 LAB — SARS CORONAVIRUS 2 (TAT 6-24 HRS): SARS Coronavirus 2: NEGATIVE

## 2018-12-22 MED ORDER — BUPIVACAINE LIPOSOME 1.3 % IJ SUSP
10.0000 mL | Freq: Once | INTRAMUSCULAR | Status: DC
  Filled 2018-12-22: qty 10

## 2018-12-23 ENCOUNTER — Other Ambulatory Visit: Payer: Self-pay

## 2018-12-23 ENCOUNTER — Encounter (HOSPITAL_COMMUNITY)
Admission: RE | Disposition: A | Discharge status: Home / Self Care | Payer: Self-pay | Source: Other Acute Inpatient Hospital | Attending: Orthopedic Surgery

## 2018-12-23 ENCOUNTER — Inpatient Hospital Stay (HOSPITAL_COMMUNITY): Payer: Medicare Other | Admitting: Physician Assistant

## 2018-12-23 ENCOUNTER — Inpatient Hospital Stay (HOSPITAL_COMMUNITY): Payer: Medicare Other

## 2018-12-23 ENCOUNTER — Encounter (HOSPITAL_COMMUNITY): Payer: Self-pay

## 2018-12-23 ENCOUNTER — Inpatient Hospital Stay (HOSPITAL_COMMUNITY): Payer: Medicare Other | Admitting: Anesthesiology

## 2018-12-23 ENCOUNTER — Inpatient Hospital Stay (HOSPITAL_COMMUNITY)
Admission: RE | Admit: 2018-12-23 | Discharge: 2018-12-24 | DRG: 470 | Disposition: A | Discharge status: Home / Self Care | Payer: Medicare Other | Source: Other Acute Inpatient Hospital | Attending: Orthopedic Surgery | Admitting: Orthopedic Surgery

## 2018-12-23 DIAGNOSIS — E039 Hypothyroidism, unspecified: Secondary | ICD-10-CM | POA: Diagnosis present

## 2018-12-23 DIAGNOSIS — Z833 Family history of diabetes mellitus: Secondary | ICD-10-CM | POA: Diagnosis not present

## 2018-12-23 DIAGNOSIS — M1612 Unilateral primary osteoarthritis, left hip: Secondary | ICD-10-CM | POA: Diagnosis present

## 2018-12-23 DIAGNOSIS — Z8551 Personal history of malignant neoplasm of bladder: Secondary | ICD-10-CM

## 2018-12-23 DIAGNOSIS — E7800 Pure hypercholesterolemia, unspecified: Secondary | ICD-10-CM | POA: Diagnosis present

## 2018-12-23 DIAGNOSIS — Z87891 Personal history of nicotine dependence: Secondary | ICD-10-CM | POA: Diagnosis not present

## 2018-12-23 DIAGNOSIS — Z7984 Long term (current) use of oral hypoglycemic drugs: Secondary | ICD-10-CM

## 2018-12-23 DIAGNOSIS — N189 Chronic kidney disease, unspecified: Secondary | ICD-10-CM | POA: Diagnosis present

## 2018-12-23 DIAGNOSIS — Z8249 Family history of ischemic heart disease and other diseases of the circulatory system: Secondary | ICD-10-CM

## 2018-12-23 DIAGNOSIS — R339 Retention of urine, unspecified: Secondary | ICD-10-CM | POA: Diagnosis not present

## 2018-12-23 DIAGNOSIS — E1122 Type 2 diabetes mellitus with diabetic chronic kidney disease: Secondary | ICD-10-CM | POA: Diagnosis present

## 2018-12-23 DIAGNOSIS — I252 Old myocardial infarction: Secondary | ICD-10-CM

## 2018-12-23 DIAGNOSIS — E119 Type 2 diabetes mellitus without complications: Secondary | ICD-10-CM

## 2018-12-23 DIAGNOSIS — I129 Hypertensive chronic kidney disease with stage 1 through stage 4 chronic kidney disease, or unspecified chronic kidney disease: Secondary | ICD-10-CM | POA: Diagnosis present

## 2018-12-23 DIAGNOSIS — Z7982 Long term (current) use of aspirin: Secondary | ICD-10-CM

## 2018-12-23 DIAGNOSIS — I25810 Atherosclerosis of coronary artery bypass graft(s) without angina pectoris: Secondary | ICD-10-CM | POA: Diagnosis present

## 2018-12-23 DIAGNOSIS — E118 Type 2 diabetes mellitus with unspecified complications: Secondary | ICD-10-CM

## 2018-12-23 DIAGNOSIS — Z419 Encounter for procedure for purposes other than remedying health state, unspecified: Secondary | ICD-10-CM

## 2018-12-23 HISTORY — PX: TOTAL HIP ARTHROPLASTY: SHX124

## 2018-12-23 LAB — GLUCOSE, CAPILLARY
Glucose-Capillary: 125 mg/dL — ABNORMAL HIGH (ref 70–99)
Glucose-Capillary: 146 mg/dL — ABNORMAL HIGH (ref 70–99)

## 2018-12-23 SURGERY — ARTHROPLASTY, HIP, TOTAL, ANTERIOR APPROACH
Anesthesia: Spinal | Site: Hip | Laterality: Left

## 2018-12-23 MED ORDER — HYDROCODONE-ACETAMINOPHEN 5-325 MG PO TABS: 1.0000 | ORAL_TABLET | Freq: Four times a day (QID) | ORAL | 0 refills | Status: DC | PRN

## 2018-12-23 MED ORDER — DIPHENHYDRAMINE HCL 12.5 MG/5ML PO ELIX: 12.5000 mg | ORAL_SOLUTION | ORAL | Status: DC | PRN

## 2018-12-23 MED ORDER — MAGNESIUM CITRATE PO SOLN: 1.0000 | Freq: Once | ORAL | Status: DC | PRN

## 2018-12-23 MED ORDER — LIDOCAINE 2% (20 MG/ML) 5 ML SYRINGE
INTRAMUSCULAR | Status: AC
  Filled 2018-12-23: qty 5

## 2018-12-23 MED ORDER — LACTATED RINGERS IV SOLN
INTRAVENOUS | Status: DC
  Administered 2018-12-23 (×2): via INTRAVENOUS

## 2018-12-23 MED ORDER — SODIUM CHLORIDE (PF) 0.9 % IJ SOLN
INTRAMUSCULAR | Status: AC
  Filled 2018-12-23: qty 20

## 2018-12-23 MED ORDER — WATER FOR IRRIGATION, STERILE IR SOLN
Status: DC | PRN
  Administered 2018-12-23: 2000 mL

## 2018-12-23 MED ORDER — POVIDONE-IODINE 10 % EX SWAB: 2.0000 "application " | Freq: Once | CUTANEOUS | Status: DC

## 2018-12-23 MED ORDER — TAMSULOSIN HCL 0.4 MG PO CAPS
0.4000 mg | ORAL_CAPSULE | Freq: Every day | ORAL | Status: DC
  Administered 2018-12-24: 0.4 mg via ORAL
  Filled 2018-12-23: qty 1

## 2018-12-23 MED ORDER — ONDANSETRON HCL 4 MG/2ML IJ SOLN: 4.0000 mg | Freq: Four times a day (QID) | INTRAMUSCULAR | Status: DC | PRN

## 2018-12-23 MED ORDER — PROPOFOL 10 MG/ML IV BOLUS
INTRAVENOUS | Status: DC | PRN
  Administered 2018-12-23: 10 mg via INTRAVENOUS
  Administered 2018-12-23: 20 mg via INTRAVENOUS
  Administered 2018-12-23: 10 mg via INTRAVENOUS

## 2018-12-23 MED ORDER — ONDANSETRON HCL 4 MG/2ML IJ SOLN
INTRAMUSCULAR | Status: DC | PRN
  Administered 2018-12-23: 4 mg via INTRAVENOUS

## 2018-12-23 MED ORDER — ACETAMINOPHEN 500 MG PO TABS
1000.0000 mg | ORAL_TABLET | Freq: Once | ORAL | Status: AC
  Administered 2018-12-23: 06:00:00 1000 mg via ORAL
  Filled 2018-12-23: qty 2

## 2018-12-23 MED ORDER — ONDANSETRON HCL 4 MG/2ML IJ SOLN
INTRAMUSCULAR | Status: AC
  Filled 2018-12-23: qty 2

## 2018-12-23 MED ORDER — ACETAMINOPHEN 325 MG PO TABS: 325.0000 mg | ORAL_TABLET | Freq: Four times a day (QID) | ORAL | Status: DC | PRN

## 2018-12-23 MED ORDER — PHENYLEPHRINE 40 MCG/ML (10ML) SYRINGE FOR IV PUSH (FOR BLOOD PRESSURE SUPPORT)
PREFILLED_SYRINGE | INTRAVENOUS | Status: DC | PRN
  Administered 2018-12-23 (×2): 120 ug via INTRAVENOUS
  Administered 2018-12-23: 160 ug via INTRAVENOUS
  Administered 2018-12-23: 120 ug via INTRAVENOUS

## 2018-12-23 MED ORDER — CEFAZOLIN SODIUM-DEXTROSE 1-4 GM/50ML-% IV SOLN
1.0000 g | Freq: Four times a day (QID) | INTRAVENOUS | Status: AC
  Administered 2018-12-23 (×2): 1 g via INTRAVENOUS
  Filled 2018-12-23 (×2): qty 50

## 2018-12-23 MED ORDER — ESMOLOL HCL 100 MG/10ML IV SOLN
INTRAVENOUS | Status: DC | PRN
  Administered 2018-12-23: 30 mg via INTRAVENOUS
  Administered 2018-12-23: 15 mg via INTRAVENOUS

## 2018-12-23 MED ORDER — EPHEDRINE SULFATE-NACL 50-0.9 MG/10ML-% IV SOSY
PREFILLED_SYRINGE | INTRAVENOUS | Status: DC | PRN
  Administered 2018-12-23: 10 mg via INTRAVENOUS

## 2018-12-23 MED ORDER — PHENYLEPHRINE HCL-NACL 10-0.9 MG/250ML-% IV SOLN
INTRAVENOUS | Status: DC | PRN
  Administered 2018-12-23: 40 ug/min via INTRAVENOUS

## 2018-12-23 MED ORDER — PHENOL 1.4 % MT LIQD: 1.0000 | OROMUCOSAL | Status: DC | PRN

## 2018-12-23 MED ORDER — HYDROCODONE-ACETAMINOPHEN 5-325 MG PO TABS
1.0000 | ORAL_TABLET | ORAL | Status: DC | PRN
  Administered 2018-12-23 (×2): 2 via ORAL
  Administered 2018-12-23 – 2018-12-24 (×2): 1 via ORAL
  Filled 2018-12-23 (×2): qty 2
  Filled 2018-12-23: qty 1
  Filled 2018-12-23: qty 2

## 2018-12-23 MED ORDER — GLIMEPIRIDE 4 MG PO TABS
4.0000 mg | ORAL_TABLET | Freq: Every day | ORAL | Status: DC
  Administered 2018-12-24: 4 mg via ORAL
  Filled 2018-12-23: qty 1

## 2018-12-23 MED ORDER — DEXAMETHASONE SODIUM PHOSPHATE 10 MG/ML IJ SOLN
4.0000 mg | Freq: Once | INTRAMUSCULAR | Status: AC
  Administered 2018-12-24: 4 mg via INTRAVENOUS
  Filled 2018-12-23: qty 1

## 2018-12-23 MED ORDER — ACETAMINOPHEN 500 MG PO TABS
500.0000 mg | ORAL_TABLET | Freq: Four times a day (QID) | ORAL | Status: AC
  Administered 2018-12-23 – 2018-12-24 (×4): 500 mg via ORAL
  Filled 2018-12-23 (×5): qty 1

## 2018-12-23 MED ORDER — BUPIVACAINE IN DEXTROSE 0.75-8.25 % IT SOLN
INTRATHECAL | Status: DC | PRN
  Administered 2018-12-23: 1.8 mL via INTRATHECAL

## 2018-12-23 MED ORDER — PHENYLEPHRINE HCL (PRESSORS) 10 MG/ML IV SOLN
INTRAVENOUS | Status: AC
  Filled 2018-12-23: qty 1

## 2018-12-23 MED ORDER — HYDROMORPHONE HCL 1 MG/ML IJ SOLN: 0.2500 mg | INTRAMUSCULAR | Status: DC | PRN

## 2018-12-23 MED ORDER — LIDOCAINE 2% (20 MG/ML) 5 ML SYRINGE
INTRAMUSCULAR | Status: DC | PRN
  Administered 2018-12-23: 50 mg via INTRAVENOUS

## 2018-12-23 MED ORDER — CEFAZOLIN SODIUM-DEXTROSE 2-4 GM/100ML-% IV SOLN
2.0000 g | INTRAVENOUS | Status: AC
  Administered 2018-12-23: 2 g via INTRAVENOUS
  Filled 2018-12-23: qty 100

## 2018-12-23 MED ORDER — FINASTERIDE 5 MG PO TABS
5.0000 mg | ORAL_TABLET | Freq: Every day | ORAL | Status: DC
  Administered 2018-12-24: 5 mg via ORAL
  Filled 2018-12-23: qty 1

## 2018-12-23 MED ORDER — MENTHOL 3 MG MT LOZG: 1.0000 | LOZENGE | OROMUCOSAL | Status: DC | PRN

## 2018-12-23 MED ORDER — SORBITOL 70 % SOLN
30.0000 mL | Freq: Every day | Status: DC | PRN
  Filled 2018-12-23: qty 30

## 2018-12-23 MED ORDER — LISINOPRIL 5 MG PO TABS
2.5000 mg | ORAL_TABLET | Freq: Every day | ORAL | Status: DC
  Administered 2018-12-24: 2.5 mg via ORAL
  Filled 2018-12-23: qty 1

## 2018-12-23 MED ORDER — SODIUM BICARBONATE 650 MG PO TABS
650.0000 mg | ORAL_TABLET | Freq: Three times a day (TID) | ORAL | Status: DC
  Administered 2018-12-23 – 2018-12-24 (×3): 650 mg via ORAL
  Filled 2018-12-23 (×4): qty 1

## 2018-12-23 MED ORDER — SODIUM CHLORIDE FLUSH 0.9 % IV SOLN
INTRAVENOUS | Status: DC | PRN
  Administered 2018-12-23: 20 mL

## 2018-12-23 MED ORDER — ONDANSETRON HCL 4 MG PO TABS: 4.0000 mg | ORAL_TABLET | Freq: Four times a day (QID) | ORAL | Status: DC | PRN

## 2018-12-23 MED ORDER — PHENYLEPHRINE 40 MCG/ML (10ML) SYRINGE FOR IV PUSH (FOR BLOOD PRESSURE SUPPORT)
PREFILLED_SYRINGE | INTRAVENOUS | Status: AC
  Filled 2018-12-23: qty 10

## 2018-12-23 MED ORDER — PROPOFOL 500 MG/50ML IV EMUL
INTRAVENOUS | Status: DC | PRN
  Administered 2018-12-23: 30 ug/kg/min via INTRAVENOUS

## 2018-12-23 MED ORDER — METOCLOPRAMIDE HCL 5 MG/ML IJ SOLN: 5.0000 mg | Freq: Three times a day (TID) | INTRAMUSCULAR | Status: DC | PRN

## 2018-12-23 MED ORDER — 0.9 % SODIUM CHLORIDE (POUR BTL) OPTIME
TOPICAL | Status: DC | PRN
  Administered 2018-12-23: 1000 mL

## 2018-12-23 MED ORDER — SUCCINYLCHOLINE CHLORIDE 200 MG/10ML IV SOSY
PREFILLED_SYRINGE | INTRAVENOUS | Status: AC
  Filled 2018-12-23: qty 10

## 2018-12-23 MED ORDER — METOCLOPRAMIDE HCL 5 MG PO TABS: 5.0000 mg | ORAL_TABLET | Freq: Three times a day (TID) | ORAL | Status: DC | PRN

## 2018-12-23 MED ORDER — METOPROLOL SUCCINATE ER 25 MG PO TB24
25.0000 mg | ORAL_TABLET | Freq: Every day | ORAL | Status: DC
  Administered 2018-12-24: 25 mg via ORAL
  Filled 2018-12-23: qty 1

## 2018-12-23 MED ORDER — TRANEXAMIC ACID-NACL 1000-0.7 MG/100ML-% IV SOLN
1000.0000 mg | INTRAVENOUS | Status: AC
  Administered 2018-12-23: 1000 mg via INTRAVENOUS
  Filled 2018-12-23: qty 100

## 2018-12-23 MED ORDER — PROPOFOL 500 MG/50ML IV EMUL
INTRAVENOUS | Status: AC
  Filled 2018-12-23: qty 50

## 2018-12-23 MED ORDER — PROPOFOL 10 MG/ML IV BOLUS
INTRAVENOUS | Status: AC
  Filled 2018-12-23: qty 40

## 2018-12-23 MED ORDER — FENTANYL CITRATE (PF) 100 MCG/2ML IJ SOLN
INTRAMUSCULAR | Status: DC | PRN
  Administered 2018-12-23: 25 ug via INTRAVENOUS

## 2018-12-23 MED ORDER — BUPIVACAINE LIPOSOME 1.3 % IJ SUSP
INTRAMUSCULAR | Status: DC | PRN
  Administered 2018-12-23: 10 mL

## 2018-12-23 MED ORDER — ALBUMIN HUMAN 5 % IV SOLN
INTRAVENOUS | Status: DC | PRN
  Administered 2018-12-23: 08:00:00 via INTRAVENOUS

## 2018-12-23 MED ORDER — DEXAMETHASONE SODIUM PHOSPHATE 10 MG/ML IJ SOLN
INTRAMUSCULAR | Status: AC
  Filled 2018-12-23: qty 1

## 2018-12-23 MED ORDER — DEXAMETHASONE SODIUM PHOSPHATE 10 MG/ML IJ SOLN
INTRAMUSCULAR | Status: DC | PRN
  Administered 2018-12-23: 8 mg via INTRAVENOUS

## 2018-12-23 MED ORDER — CHLORHEXIDINE GLUCONATE 4 % EX LIQD: 60.0000 mL | Freq: Once | CUTANEOUS | Status: DC

## 2018-12-23 MED ORDER — DOCUSATE SODIUM 100 MG PO CAPS
100.0000 mg | ORAL_CAPSULE | Freq: Two times a day (BID) | ORAL | Status: DC
  Administered 2018-12-23: 100 mg via ORAL
  Filled 2018-12-23 (×2): qty 1

## 2018-12-23 MED ORDER — METHOCARBAMOL 500 MG IVPB - SIMPLE MED
500.0000 mg | Freq: Four times a day (QID) | INTRAVENOUS | Status: DC | PRN
  Filled 2018-12-23: qty 50

## 2018-12-23 MED ORDER — ASPIRIN EC 81 MG PO TBEC: 81.0000 mg | DELAYED_RELEASE_TABLET | Freq: Two times a day (BID) | ORAL | 0 refills | Status: DC

## 2018-12-23 MED ORDER — FENTANYL CITRATE (PF) 100 MCG/2ML IJ SOLN
INTRAMUSCULAR | Status: AC
  Filled 2018-12-23: qty 2

## 2018-12-23 MED ORDER — ONDANSETRON HCL 4 MG PO TABS: 4.0000 mg | ORAL_TABLET | Freq: Three times a day (TID) | ORAL | 0 refills | Status: AC | PRN

## 2018-12-23 MED ORDER — LACTATED RINGERS IV SOLN
INTRAVENOUS | Status: DC
  Administered 2018-12-23 (×2): via INTRAVENOUS

## 2018-12-23 MED ORDER — POLYETHYLENE GLYCOL 3350 17 G PO PACK: 17.0000 g | PACK | Freq: Every day | ORAL | Status: DC | PRN

## 2018-12-23 MED ORDER — EPHEDRINE 5 MG/ML INJ
INTRAVENOUS | Status: AC
  Filled 2018-12-23: qty 10

## 2018-12-23 MED ORDER — LEVOTHYROXINE SODIUM 25 MCG PO TABS
25.0000 ug | ORAL_TABLET | Freq: Every day | ORAL | Status: DC
  Administered 2018-12-24: 25 ug via ORAL
  Filled 2018-12-23: qty 1

## 2018-12-23 MED ORDER — ASPIRIN 81 MG PO CHEW
81.0000 mg | CHEWABLE_TABLET | Freq: Two times a day (BID) | ORAL | Status: DC
  Administered 2018-12-23 – 2018-12-24 (×2): 81 mg via ORAL
  Filled 2018-12-23 (×2): qty 1

## 2018-12-23 MED ORDER — DOCUSATE SODIUM 100 MG PO CAPS: 100.0000 mg | ORAL_CAPSULE | Freq: Two times a day (BID) | ORAL | 0 refills | Status: AC

## 2018-12-23 MED ORDER — BACLOFEN 10 MG PO TABS: 10.0000 mg | ORAL_TABLET | Freq: Two times a day (BID) | ORAL | 0 refills | Status: DC | PRN

## 2018-12-23 MED ORDER — METHOCARBAMOL 500 MG PO TABS
500.0000 mg | ORAL_TABLET | Freq: Four times a day (QID) | ORAL | Status: DC | PRN
  Administered 2018-12-23 – 2018-12-24 (×4): 500 mg via ORAL
  Filled 2018-12-23 (×4): qty 1

## 2018-12-23 SURGICAL SUPPLY — 44 items
BAG ZIPLOCK 12X15 (MISCELLANEOUS) IMPLANT
BLADE SAG 18X100X1.27 (BLADE) IMPLANT
BLADE SURG SZ10 CARB STEEL (BLADE) ×6 IMPLANT
CHLORAPREP W/TINT 26 (MISCELLANEOUS) ×3 IMPLANT
CLOSURE STERI-STRIP 1/2X4 (GAUZE/BANDAGES/DRESSINGS) ×1
CLSR STERI-STRIP ANTIMIC 1/2X4 (GAUZE/BANDAGES/DRESSINGS) ×2 IMPLANT
COVER PERINEAL POST (MISCELLANEOUS) ×3 IMPLANT
COVER SURGICAL LIGHT HANDLE (MISCELLANEOUS) ×3 IMPLANT
COVER WAND RF STERILE (DRAPES) IMPLANT
DECANTER SPIKE VIAL GLASS SM (MISCELLANEOUS) ×6 IMPLANT
DRAPE IMP U-DRAPE 54X76 (DRAPES) ×3 IMPLANT
DRAPE STERI IOBAN 125X83 (DRAPES) ×3 IMPLANT
DRAPE U-SHAPE 47X51 STRL (DRAPES) ×6 IMPLANT
DRSG MEPILEX BORDER 4X8 (GAUZE/BANDAGES/DRESSINGS) ×3 IMPLANT
ELECT BLADE TIP CTD 4 INCH (ELECTRODE) ×3 IMPLANT
ELECT REM PT RETURN 15FT ADLT (MISCELLANEOUS) ×3 IMPLANT
GLOVE BIO SURGEON STRL SZ7.5 (GLOVE) ×6 IMPLANT
GLOVE BIOGEL PI IND STRL 8 (GLOVE) ×2 IMPLANT
GLOVE BIOGEL PI INDICATOR 8 (GLOVE) ×4
GOWN STRL REUS W/TWL LRG LVL3 (GOWN DISPOSABLE) ×3 IMPLANT
GOWN STRL REUS W/TWL XL LVL3 (GOWN DISPOSABLE) ×3 IMPLANT
HEAD BIOLOX HIP 36/-2.5 (Joint) ×1 IMPLANT
HIP BIOLOX HD 36/-2.5 (Joint) ×3 IMPLANT
HOLDER FOLEY CATH W/STRAP (MISCELLANEOUS) ×3 IMPLANT
INSERT TRIDENT POLY 36MM 0DEG (Insert) ×3 IMPLANT
KIT TURNOVER KIT A (KITS) IMPLANT
MANIFOLD NEPTUNE II (INSTRUMENTS) ×3 IMPLANT
NS IRRIG 1000ML POUR BTL (IV SOLUTION) ×3 IMPLANT
PACK ANTERIOR HIP CUSTOM (KITS) ×3 IMPLANT
PENCIL SMOKE EVACUATOR (MISCELLANEOUS) IMPLANT
PROTECTOR NERVE ULNAR (MISCELLANEOUS) ×3 IMPLANT
SCREW HEX LP 6.5X20 (Screw) ×3 IMPLANT
SHELL CLUSTERHOLE ACETABULAR 5 (Shell) ×3 IMPLANT
STEM HIP 4 127DEG (Stem) ×3 IMPLANT
SUT MNCRL AB 4-0 PS2 18 (SUTURE) ×3 IMPLANT
SUT STRATAFIX 0 PDS 27 VIOLET (SUTURE) ×3
SUT VIC AB 0 CT1 36 (SUTURE) ×3 IMPLANT
SUT VIC AB 1 CT1 36 (SUTURE) ×3 IMPLANT
SUT VIC AB 2-0 CT1 27 (SUTURE) ×4
SUT VIC AB 2-0 CT1 TAPERPNT 27 (SUTURE) ×2 IMPLANT
SUTURE STRATFX 0 PDS 27 VIOLET (SUTURE) ×1 IMPLANT
TRAY FOLEY MTR SLVR 16FR STAT (SET/KITS/TRAYS/PACK) ×3 IMPLANT
WATER STERILE IRR 1000ML POUR (IV SOLUTION) ×6 IMPLANT
YANKAUER SUCT BULB TIP 10FT TU (MISCELLANEOUS) ×3 IMPLANT

## 2018-12-23 NOTE — Transfer of Care (Signed)
Immediate Anesthesia Transfer of Care Note  Patient: Casey [name]  Procedure(s) Performed: TOTAL HIP ARTHROPLASTY ANTERIOR APPROACH (Left Hip)  Patient Location: PACU  Anesthesia Type:MAC and Spinal  Level of Consciousness: sedated, patient cooperative and responds to stimulation  Airway & Oxygen Therapy: Patient Spontanous Breathing and Patient connected to face mask oxygen  Post-op Assessment: Report given to RN and Post -op Vital signs reviewed and stable  Post vital signs: Reviewed and stable  Last Vitals:  Vitals Value Taken Time  BP 120/65 12/23/18 0940  Temp    Pulse 81 12/23/18 0942  Resp 17 12/23/18 0942  SpO2 99 % 12/23/18 0942  Vitals shown include unvalidated device data.  Last Pain:  Vitals:   12/23/18 0601  TempSrc:   PainSc: 1       Patients Stated Pain Goal: 0 (29/93/71 6967)  Complications: No apparent anesthesia complications

## 2018-12-23 NOTE — Anesthesia Procedure Notes (Signed)
Spinal  Patient location during procedure: OR Start time: 12/23/2018 7:33 AM End time: 12/23/2018 7:47 AM Staffing Resident/CRNA: Teheng, Ryan E, CRNA Performed: resident/CRNA  Preanesthetic Checklist Completed: patient identified, site marked, surgical consent, pre-op evaluation, timeout performed, IV checked, risks and benefits discussed and monitors and equipment checked Spinal Block Patient position: sitting Prep: DuraPrep Patient monitoring: heart rate, cardiac monitor, continuous pulse ox and blood pressure Approach: midline Location: L3-4 Injection technique: single-shot Needle Needle type: Sprotte  Needle gauge: 24 G Needle length: 9 cm Assessment Sensory level: T4 Additional Notes IV functioning, monitors applied to pt. Expiration date of kit checked and confirmed to be in date. Sterile prep and drape, hand hygiene and sterile gloved used. Pt was positioned and spine was prepped in sterile fashion. Skin was anesthetized with lidocaine. Free flow of clear CSF obtained prior to injecting local anesthetic into CSF x 1 attempt. Spinal needle aspirated freely following injection. Needle was carefully withdrawn, and pt tolerated procedure well. Loss of motor and sensory on exam post injection.      

## 2018-12-23 NOTE — Evaluation (Signed)
Physical Therapy Evaluation Patient Details Name: Casey [name] MRN: [id number] DOB: 1929-12-01 Today's Date: 12/23/2018   History of Present Illness  L DA-THA; PMH of R posterior hip 19 years ago  Clinical Impression  Pt is s/p THA resulting in the deficits listed below (see PT Problem List). Pt ambulated 61' with RW. Initiated THA HEP. Good progress expected.  Pt will benefit from skilled PT to increase their independence and safety with mobility to allow discharge to the venue listed below.      Follow Up Recommendations Follow surgeon's recommendation for DC plan and follow-up therapies    Equipment Recommendations  Rolling walker with 5" wheels    Recommendations for Other Services       Precautions / Restrictions Precautions Precautions: Fall Restrictions Weight Bearing Restrictions: No LLE Weight Bearing: Weight bearing as tolerated      Mobility  Bed Mobility Overal bed mobility: Needs Assistance Bed Mobility: Supine to Sit     Supine to sit: Min assist     General bed mobility comments: assist to raise trunk  Transfers Overall transfer level: Needs assistance Equipment used: Rolling walker (2 wheeled) Transfers: Sit to/from Stand Sit to Stand: Min assist         General transfer comment: VCs for hand placement, min A to rise  Ambulation/Gait Ambulation/Gait assistance: Min guard Gait Distance (Feet): 45 Feet Assistive device: Rolling walker (2 wheeled) Gait Pattern/deviations: Step-to pattern;Decreased stride length Gait velocity: decr   General Gait Details: VCs sequencing, distance limited by pain  Stairs            Wheelchair Mobility    Modified Rankin (Stroke Patients Only)       Balance Overall balance assessment: Modified Independent                                           Pertinent Vitals/Pain Pain Assessment: 0-10 Pain Score: 4  Pain Location: L hip Pain Descriptors / Indicators: Sore Pain  Intervention(s): Limited activity within patient's tolerance;Monitored during session;Premedicated before session;Ice applied    Home Living Family/patient expects to be discharged to:: Private residence Living Arrangements: Alone Available Help at Discharge: Family;Available 24 hours/day   Home Access: Stairs to enter   Entrance Stairs-Number of Steps: 1 Home Layout: One level Home Equipment: Bedside commode      Prior Function Level of Independence: Independent with assistive device(s)         Comments: used walking stick prn     Hand Dominance        Extremity/Trunk Assessment   Upper Extremity Assessment Upper Extremity Assessment: Overall WFL for tasks assessed    Lower Extremity Assessment Lower Extremity Assessment: LLE deficits/detail LLE Deficits / Details: knee ext at least 3/5, hip +2/5 LLE Sensation: WNL LLE Coordination: WNL    Cervical / Trunk Assessment Cervical / Trunk Assessment: Normal  Communication   Communication: No difficulties  Cognition Arousal/Alertness: Awake/alert Behavior During Therapy: WFL for tasks assessed/performed Overall Cognitive Status: Within Functional Limits for tasks assessed                                        General Comments      Exercises Total Joint Exercises Ankle Circles/Pumps: AROM;Both;10 reps;Supine Heel Slides: Left;10 reps;Supine;AAROM   Assessment/Plan  PT Assessment Patient needs continued PT services  PT Problem List Decreased strength;Decreased range of motion;Decreased activity tolerance;Decreased mobility;Pain       PT Treatment Interventions DME instruction;Gait training;Therapeutic exercise;Therapeutic activities;Patient/family education    PT Goals (Current goals can be found in the Care Plan section)  Acute Rehab PT Goals Patient Stated Goal: play golf PT Goal Formulation: With patient Time For Goal Achievement: 12/30/18 Potential to Achieve Goals: Good     Frequency 7X/week   Barriers to discharge        Co-evaluation               AM-PAC PT "6 Clicks" Mobility  Outcome Measure Help needed turning from your back to your side while in a flat bed without using bedrails?: A Lot Help needed moving from lying on your back to sitting on the side of a flat bed without using bedrails?: A Lot Help needed moving to and from a bed to a chair (including a wheelchair)?: A Little Help needed standing up from a chair using your arms (e.g., wheelchair or bedside chair)?: A Little Help needed to walk in hospital room?: A Little Help needed climbing 3-5 steps with a railing? : A Lot 6 Click Score: 15    End of Session Equipment Utilized During Treatment: Gait belt Activity Tolerance: Patient tolerated treatment well Patient left: in chair;with call bell/phone within reach;with family/visitor present Nurse Communication: Mobility status PT Visit Diagnosis: Difficulty in walking, not elsewhere classified (R26.2);Pain Pain - Right/Left: Left Pain - part of body: Hip    Time: [id number] PT Time Calculation (min) (ACUTE ONLY): 24 min   Charges:   PT Evaluation $PT Eval Low Complexity: 1 Low PT Treatments $Gait Training: 8-22 mins       Blondell Reveal Kistler PT 12/23/2018  Acute Rehabilitation Services Pager [phone] Office [phone]

## 2018-12-23 NOTE — Interval H&P Note (Signed)
I participated in the care of this patient and agree with the above history, physical and evaluation. I performed a review of the history and a physical exam as detailed   Lorayne Getchell Daniel Kristell Wooding MD  

## 2018-12-23 NOTE — Discharge Instructions (Signed)
You may bear weight as tolerated. °Keep your dressing on and dry until follow up. °Take medicine to prevent blood clots as directed. °Take pain medicine as needed with the goal of transitioning to over the counter medicines.  ° °INSTRUCTIONS AFTER JOINT REPLACEMENT  ° °o Remove items at home which could result in a fall. This includes throw rugs or furniture in walking pathways °o ICE to the affected joint every three hours while awake for 30 minutes at a time, for at least the first 3-5 days, and then as needed for pain and swelling.  Continue to use ice for pain and swelling. You may notice swelling that will progress down to the foot and ankle.  This is normal after surgery.  Elevate your leg when you are not up walking on it.   °o Continue to use the breathing machine you got in the hospital (incentive spirometer) which will help keep your temperature down.  It is common for your temperature to cycle up and down following surgery, especially at night when you are not up moving around and exerting yourself.  The breathing machine keeps your lungs expanded and your temperature down. ° ° °DIET:  As you were doing prior to hospitalization, we recommend a well-balanced diet. ° °DRESSING / WOUND CARE / SHOWERING ° °You may shower 3 days after surgery, but keep the wounds dry during showering.  You may use an occlusive plastic wrap (Press'n Seal for example) with blue painter's tape at edges, NO SOAKING/SUBMERGING IN THE BATHTUB.  If the bandage gets wet, change with a clean dry gauze.  If the incision gets wet, pat the wound dry with a clean towel. ° °ACTIVITY ° °o Increase activity slowly as tolerated, but follow the weight bearing instructions below.   °o No driving for 6 weeks or until further direction given by your physician.  You cannot drive while taking narcotics.  °o No lifting or carrying greater than 10 lbs. until further directed by your surgeon. °o Avoid periods of inactivity such as sitting longer than  an hour when not asleep. This helps prevent blood clots.  °o You may return to work once you are authorized by your doctor.  ° ° ° °WEIGHT BEARING  ° °Weight bearing as tolerated with assist device (walker, cane, etc) as directed, use it as long as suggested by your surgeon or therapist, typically at least 4-6 weeks. ° ° °EXERCISES ° °Results after joint replacement surgery are often greatly improved when you follow the exercise, range of motion and muscle strengthening exercises prescribed by your doctor. Safety measures are also important to protect the joint from further injury. Any time any of these exercises cause you to have increased pain or swelling, decrease what you are doing until you are comfortable again and then slowly increase them. If you have problems or questions, call your caregiver or physical therapist for advice.  ° °Rehabilitation is important following a joint replacement. After just a few days of immobilization, the muscles of the leg can become weakened and shrink (atrophy).  These exercises are designed to build up the tone and strength of the thigh and leg muscles and to improve motion. Often times heat used for twenty to thirty minutes before working out will loosen up your tissues and help with improving the range of motion but do not use heat for the first two weeks following surgery (sometimes heat can increase post-operative swelling).  ° °These exercises can be done on a training (exercise)   exercise) mat, on the floor, on a table or on a bed. Use whatever works the best and is most comfortable for you.    Use music or television while you are exercising so that the exercises are a pleasant break in your day. This will make your life better with the exercises acting as a break in your routine that you can look forward to.   Perform all exercises about fifteen times, three times per day or as directed.  You should exercise both the operative leg and the other leg as well.  Exercises  include:    Quad Sets - Tighten up the muscle on the front of the thigh (Quad) and hold for 5-10 seconds.    Straight Leg Raises - With your knee straight (if you were given a brace, keep it on), lift the leg to 60 degrees, hold for 3 seconds, and slowly lower the leg.  Perform this exercise against resistance later as your leg gets stronger.   Leg Slides: Lying on your back, slowly slide your foot toward your buttocks, bending your knee up off the floor (only go as far as is comfortable). Then slowly slide your foot back down until your leg is flat on the floor again.   Angel Wings: Lying on your back spread your legs to the side as far apart as you can without causing discomfort.   Hamstring Strength:  Lying on your back, push your heel against the floor with your leg straight by tightening up the muscles of your buttocks.  Repeat, but this time bend your knee to a comfortable angle, and push your heel against the floor.  You may put a pillow under the heel to make it more comfortable if necessary.   A rehabilitation program following joint replacement surgery can speed recovery and prevent re-injury in the future due to weakened muscles. Contact your doctor or a physical therapist for more information on knee rehabilitation.    CONSTIPATION  Constipation is defined medically as fewer than three stools per week and severe constipation as less than one stool per week.  Even if you have a regular bowel pattern at home, your normal regimen is likely to be disrupted due to multiple reasons following surgery.  Combination of anesthesia, postoperative narcotics, change in appetite and fluid intake all can affect your bowels.   YOU MUST use at least one of the following options; they are listed in order of increasing strength to get the job done.  They are all available over the counter, and you may need to use some, POSSIBLY even all of these options:    Drink plenty of fluids (prune juice may be  helpful) and high fiber foods Colace 100 mg by mouth twice a day  Senokot for constipation as directed and as needed Dulcolax (bisacodyl), take with full glass of water  Miralax (polyethylene glycol) once or twice a day as needed.  If you have tried all these things and are unable to have a bowel movement in the first 3-4 days after surgery call either your surgeon or your primary doctor.    If you experience loose stools or diarrhea, hold the medications until you stool forms back up.  If your symptoms do not get better within 1 week or if they get worse, check with your doctor.  If you experience "the worst abdominal pain ever" or develop nausea or vomiting, please contact the office immediately for further recommendations for treatment.   ITCHING:  If  experience itching with your medications, try taking only a single pain pill, or even half a pain pill at a time.  You can also use Benadryl over the counter for itching or also to help with sleep.  ° °TED HOSE STOCKINGS:  Use stockings on both legs until for at least 2 weeks or as directed by physician office. They may be removed at night for sleeping. ° °MEDICATIONS:  See your medication summary on the “After Visit Summary” that nursing will review with you.  You may have some home medications which will be placed on hold until you complete the course of blood thinner medication.  It is important for you to complete the blood thinner medication as prescribed. ° °PRECAUTIONS:  If you experience chest pain or shortness of breath - call 911 immediately for transfer to the hospital emergency department.  ° °If you develop a fever greater that 101 F, purulent drainage from wound, increased redness or drainage from wound, foul odor from the wound/dressing, or calf pain - CONTACT YOUR SURGEON.   °                                                °FOLLOW-UP APPOINTMENTS:  If you do not already have a post-op appointment, please call the office for an  appointment to be seen by your surgeon.  Guidelines for how soon to be seen are listed in your “After Visit Summary”, but are typically between 1-4 weeks after surgery. ° °OTHER INSTRUCTIONS:  ° ° ° °MAKE SURE YOU:  °• Understand these instructions.  °• Get help right away if you are not doing well or get worse.  ° ° °Thank you for letting us be a part of your medical care team.  It is a privilege we respect greatly.  We hope these instructions will help you stay on track for a fast and full recovery!  ° ° ° ° °

## 2018-12-23 NOTE — Anesthesia Postprocedure Evaluation (Signed)
Anesthesia Post Note  Patient: Casey [name]  Procedure(s) Performed: TOTAL HIP ARTHROPLASTY ANTERIOR APPROACH (Left Hip)     Patient location during evaluation: PACU Anesthesia Type: Spinal Level of consciousness: oriented and awake and alert Pain management: pain level controlled Vital Signs Assessment: post-procedure vital signs reviewed and stable Respiratory status: spontaneous breathing, respiratory function stable and patient connected to nasal cannula oxygen Cardiovascular status: blood pressure returned to baseline and stable Postop Assessment: no headache, no backache, no apparent nausea or vomiting and spinal receding Anesthetic complications: no    Last Vitals:  Vitals:   12/23/18 1200 12/23/18 1239  BP: 115/60 127/70  Pulse: 75 83  Resp: 15 18  Temp:  36.7 C  SpO2: 98% 100%    Last Pain:  Vitals:   12/23/18 1200  TempSrc:   PainSc: 0-No pain                 Effie Berkshire

## 2018-12-23 NOTE — Op Note (Signed)
12/23/2018  9:08 AM  PATIENT:  Casey [name]   MRN: [id number]  PRE-OPERATIVE DIAGNOSIS:  OA LEFT HIP  POST-OPERATIVE DIAGNOSIS:  OA LEFT HIP  PROCEDURE:  Procedure(s): TOTAL HIP ARTHROPLASTY ANTERIOR APPROACH  PREOPERATIVE INDICATIONS:    Casey [name] is an 85 y.o. male who has a diagnosis of Primary osteoarthritis of left hip and elected for surgical management after failing conservative treatment.  The risks benefits and alternatives were discussed with the patient including but not limited to the risks of nonoperative treatment, versus surgical intervention including infection, bleeding, nerve injury, periprosthetic fracture, the need for revision surgery, dislocation, leg length discrepancy, blood clots, cardiopulmonary complications, morbidity, mortality, among others, and they were willing to proceed.     OPERATIVE REPORT     SURGEON:   Renette Butters, MD    ASSISTANT:  Roxan Hockey, PA-C, he was present and scrubbed throughout the case, critical for completion in a timely fashion, and for retraction, instrumentation, and closure.     ANESTHESIA:  General    COMPLICATIONS:  None.     COMPONENTS:  [employer]ker acolade fit femur size 4 with a 36 mm -2.5 head ball and an acetabular shell size 52 with a  polyethylene liner    PROCEDURE IN DETAIL:   The patient was met in the holding area and  identified.  The appropriate hip was identified and marked at the operative site.  The patient was then transported to the OR  and  placed under anesthesia per that record.  At that point, the patient was  placed in the supine position and  secured to the operating room table and all bony prominences padded. He received pre-operative antibiotics    The operative lower extremity was prepped from the iliac crest to the distal leg.  Sterile draping was performed.  Time out was performed prior to incision.      Skin incision was made just 2 cm lateral to the ASIS  extending in line with  the tensor fascia lata. Electrocautery was used to control all bleeders. I dissected down sharply to the fascia of the tensor fascia lata was confirmed that the muscle fibers beneath were running posteriorly. I then incised the fascia over the superficial tensor fascia lata in line with the incision. The fascia was elevated off the anterior aspect of the muscle the muscle was retracted posteriorly and protected throughout the case. I then used electrocautery to incise the tensor fascia lata fascia control and all bleeders. Immediately visible was the fat over top of the anterior neck and capsule.  I removed the anterior fat from the capsule and elevated the rectus muscle off of the anterior capsule. I then removed a large time of capsule. The retractors were then placed over the anterior acetabulum as well as around the superior and inferior neck.  I then made a femoral neck cut. Then used the power corkscrew to remove the femoral head from the acetabulum and thoroughly irrigated the acetabulum. I sized the femoral head.    I then exposed the deep acetabulum, cleared out any tissue including the ligamentum teres.   After adequate visualization, I excised the labrum, and then sequentially reamed.  I then impacted the acetabular implant into place using fluoroscopy for guidance.  Appropriate version and inclination was confirmed clinically matching their bony anatomy, and with fluoroscopy.  I placed a 20 mm screw in the posterior/superio position with an excellent bite.    I then placed the polyethylene liner  in place  I then adducted the leg and released the external rotators from the posterior femur allowing it to be easily delivered up lateral and anterior to the acetabulum for preparation of the femoral canal.    I then prepared the proximal femur using the cookie-cutter and then sequentially reamed and broached.  A trial broach, neck, and head was utilized, and I reduced the hip and used  floroscopy to assess the neck length and femoral implant.  I then impacted the femoral prosthesis into place into the appropriate version. The hip was then reduced and fluoroscopy confirmed appropriate position. Leg lengths were restored.  I then irrigated the hip copiously again with, and repaired the fascia with Vicryl, followed by monocryl for the subcutaneous tissue, Monocryl for the skin, Steri-Strips and sterile gauze. The patient was then awakened and returned to PACU in stable and satisfactory condition. There were no complications.  POST OPERATIVE PLAN: WBAT, DVT px: SCD's/TED, ambulation and chemical dvt px  Edmonia Lynch, MD Orthopedic Surgeon [phone]

## 2018-12-24 ENCOUNTER — Encounter (HOSPITAL_COMMUNITY): Payer: Self-pay | Admitting: Orthopedic Surgery

## 2018-12-24 MED ORDER — BETHANECHOL CHLORIDE 25 MG PO TABS
50.0000 mg | ORAL_TABLET | Freq: Three times a day (TID) | ORAL | Status: DC
  Administered 2018-12-24: 50 mg via ORAL
  Filled 2018-12-24 (×2): qty 2

## 2018-12-24 MED ORDER — CHLORHEXIDINE GLUCONATE CLOTH 2 % EX PADS: 6.0000 | MEDICATED_PAD | Freq: Every day | CUTANEOUS | Status: DC

## 2018-12-24 NOTE — Discharge Summary (Addendum)
Discharge Summary  Patient ID: Casey [name] MRN: [id number] DOB/AGE: [birth year] 85 y.o.  Admit date: 12/23/2018 Discharge date: 12/24/2018  Admission Diagnoses:  Primary osteoarthritis of left hip  Discharge Diagnoses:  Principal Problem:   Primary osteoarthritis of left hip Active Problems:   CAD (coronary artery disease) of artery bypass graft   Diabetes (HCC)   High cholesterol   Past Medical History:  Diagnosis Date  . Anemia   . Arthritis    "all over" all joints  . Bladder cancer (HCC)    bladder  . Blood transfusion 2008  . Chronic kidney disease    bladder ca in 2008;Kid. failure stage 3  . Complication of anesthesia    Ilios post op  . Coronary artery disease   . Diabetes mellitus   . DJD (degenerative joint disease)   . Guaiac + stool 06/28/2017  . Heart murmur   . Hypothyroidism    hyper thyroidism recently  . Ileus ([place]) 2001   post hip replacement  . Metabolic acidosis   . Metabolic bone disease   . Myocardial infarction [facility]) age 85    Surgeries: Procedure(s): TOTAL HIP ARTHROPLASTY ANTERIOR APPROACH on 12/23/2018   Consultants (if any):   Discharged Condition: Improved  Hospital Course: Casey [name] is an 85 y.o. male who was admitted 12/23/2018 with a diagnosis of Primary osteoarthritis of left hip and went to the operating room on 12/23/2018 and underwent the above named procedures.    He was given perioperative antibiotics:  Anti-infectives (From admission, onward)   Start     Dose/Rate Route Frequency Ordered Stop   12/23/18 1400  ceFAZolin (ANCEF) IVPB 1 g/50 mL premix     1 g 100 mL/hr over 30 Minutes Intravenous Every 6 hours 12/23/18 1233 12/23/18 2153   12/23/18 0600  ceFAZolin (ANCEF) IVPB 2g/100 mL premix     2 g 200 mL/hr over 30 Minutes Intravenous On call to O.R. 12/23/18 [id number] 12/23/18 0743    .  He was given sequential compression devices, early ambulation, and aspirin for DVT prophylaxis.  He benefited maximally  from the hospital stay and there were no complications.    The patient had urinary retention postop day 1.  He has a history of same including need for catheterization and discharge with same/follow-up with urology.  Bladder scan showed at least 700 mL.  Coude catheter was performed and approximately 1500 mL of urine was obtained.  The patient tolerated this well and desires postop day 1 discharge.  Dr. Percell Miller discussed his case with Dr. Alinda Money in urology who recommends follow-up in [place]: [phone].  Recent vital signs:  Vitals:   12/24/18 0000 12/24/18 0439  BP: 130/71 (!) 154/67  Pulse: 74 75  Resp: 18 16  Temp: 98.2 F (36.8 C) 98.1 F (36.7 C)  SpO2: 98% 98%    Recent laboratory studies:  Lab Results  Component Value Date   HGB 12.6 (L) 12/15/2018   Lab Results  Component Value Date   WBC 7.2 12/15/2018   PLT 298 12/15/2018   No results found for: INR Lab Results  Component Value Date   NA 133 (L) 12/15/2018   K 4.5 12/15/2018   CL 102 12/15/2018   CO2 22 12/15/2018   BUN 43 (H) 12/15/2018   CREATININE 1.75 (H) 12/15/2018   GLUCOSE 195 (H) 12/15/2018    Discharge Medications:   Allergies as of 12/24/2018      Reactions   Sulfa Antibiotics  unknown      Medication List    TAKE these medications   aspirin EC 81 MG tablet Take 1 tablet (81 mg total) by mouth 2 (two) times daily. For DVT prophylaxis for 30 days after surgery. What changed:   when to take this  additional instructions   baclofen 10 MG tablet Commonly known as: LIORESAL Take 1 tablet (10 mg total) by mouth 2 (two) times daily as needed for muscle spasms.   docusate sodium 100 MG capsule Commonly known as: Colace Take 1 capsule (100 mg total) by mouth 2 (two) times daily. To prevent constipation while taking pain medication.   Fenofibric Acid 135 MG Cpdr Take 135 mg by mouth daily.   finasteride 5 MG tablet Commonly known as: PROSCAR Take 5 mg by mouth daily.   Flomax 0.4  MG Caps capsule Generic drug: tamsulosin Take 0.4 mg by mouth daily.   glimepiride 4 MG tablet Commonly known as: AMARYL Take 4 mg by mouth daily before breakfast.   HYDROcodone-acetaminophen 5-325 MG tablet Commonly known as: Norco Take 1-2 tablets by mouth every 6 (six) hours as needed for severe pain.   levothyroxine 25 MCG tablet Commonly known as: SYNTHROID Take 25 mcg by mouth daily before breakfast.   lisinopril 2.5 MG tablet Commonly known as: ZESTRIL Take 2.5 mg by mouth daily.   metoprolol succinate 25 MG 24 hr tablet Commonly known as: TOPROL-XL Take 25 mg by mouth daily.   multivitamin tablet Take 1 tablet by mouth daily.   ondansetron 4 MG tablet Commonly known as: Zofran Take 1 tablet (4 mg total) by mouth every 8 (eight) hours as needed for nausea or vomiting.   sodium bicarbonate 650 MG tablet Take 650 mg by mouth 3 (three) times daily.       Diagnostic Studies: Dg C-arm 1-60 Min-no Report  Result Date: 12/23/2018 Fluoroscopy was utilized by the requesting physician.  No radiographic interpretation.   Dg Hip Operative Unilat W Or W/o Pelvis Left  Result Date: 12/23/2018 CLINICAL DATA:  Left total hip arthroplasty EXAM: OPERATIVE LEFT HIP WITH PELVIS; DG C-ARM 1-60 MIN-NO REPORT OPERATIVE LEFT HIP (WITH PELVIS IF PERFORMED) 2 VIEWS COMPARISON:  None. FLUOROSCOPY TIME:  Fluoroscopy Time:  0 minutes 30 seconds Radiation Exposure Index (if provided by the fluoroscopic device): 3.98 mGy Number of Acquired Spot Images: 2 FINDINGS: Nondiagnostic spot fluoroscopic intraoperative left hip radiographs demonstrate expected postsurgical changes from left total hip arthroplasty on these frontal views. Partially visualized right total hip arthroplasty. IMPRESSION: Intraoperative fluoroscopic guidance for left total hip arthroplasty. Electronically Signed   By: Ilona Sorrel M.D.   On: 12/23/2018 09:30    Disposition: Discharge disposition: 01-Home or Self  Care       Discharge Instructions    Discharge patient   Complete by: As directed    Discharge disposition: 01-Home or Self Care   Discharge patient date: 12/24/2018      Follow-up Information    Renette Butters, MD.   Specialty: Orthopedic Surgery Contact information: [address] [place] [address] [phone]            Signed: Prudencio Burly III PA-C 12/24/2018, 8:32 AM

## 2018-12-24 NOTE — Progress Notes (Signed)
Physical Therapy Treatment Patient Details Name: Casey [name] MRN: [id number] DOB: 14-Sep-1929 Today's Date: 12/24/2018    History of Present Illness L DA-THA; PMH of R posterior hip 19 years ago    PT Comments    Pt is progressing well with mobility and is ready to DC home from PT standpoint. He ambulated 160' with RW, completed stair training, and demonstrates good understanding of HEP.    Follow Up Recommendations  Follow surgeon's recommendation for DC plan and follow-up therapies     Equipment Recommendations  Rolling walker with 5" wheels    Recommendations for Other Services       Precautions / Restrictions Precautions Precautions: Fall Restrictions Weight Bearing Restrictions: No LLE Weight Bearing: Weight bearing as tolerated    Mobility  Bed Mobility               General bed mobility comments: up in recliner  Transfers Overall transfer level: Needs assistance Equipment used: Rolling walker (2 wheeled) Transfers: Sit to/from Stand Sit to Stand: Supervision         General transfer comment: VCs for hand placement  Ambulation/Gait Ambulation/Gait assistance: Supervision Gait Distance (Feet): 160 Feet Assistive device: Rolling walker (2 wheeled) Gait Pattern/deviations: Decreased stride length;Step-through pattern;Trunk flexed Gait velocity: decr   General Gait Details: steady, no loss of balance, VCs for posture   Stairs Stairs: Yes Stairs assistance: Min assist Stair Management: No rails;Forwards;Step to pattern Number of Stairs: 1 General stair comments: VCs sequencing, 1 step x 2 trials, min A to steady RW   Wheelchair Mobility    Modified Rankin (Stroke Patients Only)       Balance Overall balance assessment: Modified Independent                                          Cognition Arousal/Alertness: Awake/alert Behavior During Therapy: WFL for tasks assessed/performed Overall Cognitive Status: Within  Functional Limits for tasks assessed                                        Exercises Total Joint Exercises Ankle Circles/Pumps: AROM;Both;10 reps;Supine Quad Sets: AROM;Left;5 reps;Supine Short Arc Quad: AROM;Left;10 reps;Supine Heel Slides: Left;10 reps;Supine;AAROM Hip ABduction/ADduction: AAROM;Left;10 reps;Supine Long Arc Quad: AROM;Left;10 reps;Seated    General Comments        Pertinent Vitals/Pain Pain Score: 4  Pain Location: L hip Pain Descriptors / Indicators: Sore Pain Intervention(s): Limited activity within patient's tolerance;Monitored during session;Ice applied    Home Living                      Prior Function            PT Goals (current goals can now be found in the care plan section) Acute Rehab PT Goals Patient Stated Goal: play golf PT Goal Formulation: With patient Time For Goal Achievement: 12/30/18 Potential to Achieve Goals: Good Progress towards PT goals: Progressing toward goals    Frequency    7X/week      PT Plan Current plan remains appropriate    Co-evaluation              AM-PAC PT "6 Clicks" Mobility   Outcome Measure  Help needed turning from your back to your side while in a flat bed  without using bedrails?: A Little Help needed moving from lying on your back to sitting on the side of a flat bed without using bedrails?: A Little Help needed moving to and from a bed to a chair (including a wheelchair)?: A Little Help needed standing up from a chair using your arms (e.g., wheelchair or bedside chair)?: None Help needed to walk in hospital room?: None Help needed climbing 3-5 steps with a railing? : A Little 6 Click Score: 20    End of Session Equipment Utilized During Treatment: Gait belt Activity Tolerance: Patient tolerated treatment well Patient left: in chair;with call bell/phone within reach Nurse Communication: Mobility status PT Visit Diagnosis: Difficulty in walking, not elsewhere  classified (R26.2);Pain Pain - Right/Left: Left Pain - part of body: Hip     Time: 0923-0952 PT Time Calculation (min) (ACUTE ONLY): 29 min  Charges:  $Gait Training: 8-22 mins $Therapeutic Exercise: 8-22 mins                     Blondell Reveal Kistler PT 12/24/2018  Acute Rehabilitation Services Pager [phone] Office [phone]

## 2018-12-24 NOTE — Progress Notes (Signed)
In and out catheter attempted and unsuccessful due to resistance. HC, the PA paged.

## 2018-12-24 NOTE — Progress Notes (Signed)
    Subjective: Patient reports pain as mild to moderate, controlled.  Tolerating diet.  Urinating.  No CP, SOB.  Good early mobilization.  Objective:   VITALS:   Vitals:   12/23/18 1654 12/23/18 2001 12/24/18 0000 12/24/18 0439  BP: (!) 157/84 (!) 148/78 130/71 (!) 154/67  Pulse: 90 85 74 75  Resp: 16 18 18 16   Temp: 97.9 F (36.6 C) 98.4 F (36.9 C) 98.2 F (36.8 C) 98.1 F (36.7 C)  TempSrc:  Oral Oral Oral  SpO2: 98% 96% 98% 98%  Weight:      Height:       CBC Latest Ref Rng & Units 12/15/2018  WBC 4.0 - 10.5 K/uL 7.2  Hemoglobin 13.0 - 17.0 g/dL 12.6(L)  Hematocrit 39.0 - 52.0 % 39.7  Platelets 150 - 400 K/uL 298   BMP Latest Ref Rng & Units 12/15/2018  Glucose 70 - 99 mg/dL 195(H)  BUN 8 - 23 mg/dL 43(H)  Creatinine 0.61 - 1.24 mg/dL 1.75(H)  Sodium 135 - 145 mmol/L 133(L)  Potassium 3.5 - 5.1 mmol/L 4.5  Chloride 98 - 111 mmol/L 102  CO2 22 - 32 mmol/L 22  Calcium 8.9 - 10.3 mg/dL 10.0   Intake/Output      11/24 0701 - 11/25 0700 11/25 0701 - 11/26 0700   P.O. 600    I.V. (mL/kg) 3037.5 (39.8)    IV Piggyback 496    Total Intake(mL/kg) 4133.5 (54.1)    Urine (mL/kg/hr) 3825 (2.1)    Stool 0    Blood 175    Total Output 4000    Net +133.5         Stool Occurrence 0 x       Physical Exam: General: NAD.  Upright in bed.  Calm, conversant. Resp: No increased wob Cardio: regular rate and rhythm ABD soft Neurologically intact MSK Neurovascularly intact Sensation intact distally Intact pulses distally Dorsiflexion/Plantar flexion intact Incision: dressing C/D/I   Assessment: 1 Day Post-Op  S/P Procedure(s) (LRB): TOTAL HIP ARTHROPLASTY ANTERIOR APPROACH (Left) by Dr. Ernesta Amble. Percell Miller on 12/23/2018  Principal Problem:   Primary osteoarthritis of left hip Active Problems:   CAD (coronary artery disease) of artery bypass graft   Diabetes (HCC)   High cholesterol   Primary osteoarthritis, status post total hip arthroplasty Doing well  postop day 1 Tolerating diet and voiding Pain controlled Mobilizing well   Plan: Up with therapy Incentive Spirometry Apply ice   Weight Bearing: Weight Bearing as Tolerated (WBAT) LLE Dressings: Maintain Mepilex.   VTE prophylaxis: Aspirin, SCDs, ambulation Dispo: Home today   Prudencio Burly III, PA-C 12/24/2018, 8:30 AM

## 2018-12-27 ENCOUNTER — Other Ambulatory Visit: Payer: Self-pay

## 2018-12-27 ENCOUNTER — Emergency Department (HOSPITAL_COMMUNITY)
Admission: EM | Admit: 2018-12-27 | Discharge: 2018-12-27 | Disposition: A | Discharge status: Home / Self Care | Payer: Medicare Other | Attending: Emergency Medicine | Admitting: Emergency Medicine

## 2018-12-27 ENCOUNTER — Encounter (HOSPITAL_COMMUNITY): Payer: Self-pay | Admitting: Emergency Medicine

## 2018-12-27 DIAGNOSIS — E1122 Type 2 diabetes mellitus with diabetic chronic kidney disease: Secondary | ICD-10-CM | POA: Insufficient documentation

## 2018-12-27 DIAGNOSIS — N390 Urinary tract infection, site not specified: Secondary | ICD-10-CM | POA: Diagnosis not present

## 2018-12-27 DIAGNOSIS — N183 Chronic kidney disease, stage 3 unspecified: Secondary | ICD-10-CM | POA: Insufficient documentation

## 2018-12-27 DIAGNOSIS — Z7982 Long term (current) use of aspirin: Secondary | ICD-10-CM | POA: Diagnosis not present

## 2018-12-27 DIAGNOSIS — Z87891 Personal history of nicotine dependence: Secondary | ICD-10-CM | POA: Diagnosis not present

## 2018-12-27 DIAGNOSIS — R339 Retention of urine, unspecified: Secondary | ICD-10-CM | POA: Diagnosis present

## 2018-12-27 DIAGNOSIS — I2510 Atherosclerotic heart disease of native coronary artery without angina pectoris: Secondary | ICD-10-CM | POA: Diagnosis not present

## 2018-12-27 DIAGNOSIS — E039 Hypothyroidism, unspecified: Secondary | ICD-10-CM | POA: Insufficient documentation

## 2018-12-27 DIAGNOSIS — Z8551 Personal history of malignant neoplasm of bladder: Secondary | ICD-10-CM | POA: Diagnosis not present

## 2018-12-27 DIAGNOSIS — Z79899 Other long term (current) drug therapy: Secondary | ICD-10-CM | POA: Diagnosis not present

## 2018-12-27 DIAGNOSIS — Z96642 Presence of left artificial hip joint: Secondary | ICD-10-CM | POA: Insufficient documentation

## 2018-12-27 LAB — CBC
HCT: 35.9 % — ABNORMAL LOW (ref 39.0–52.0)
Hemoglobin: 11.3 g/dL — ABNORMAL LOW (ref 13.0–17.0)
MCH: 28.3 pg (ref 26.0–34.0)
MCHC: 31.5 g/dL (ref 30.0–36.0)
MCV: 90 fL (ref 80.0–100.0)
Platelets: 276 10*3/uL (ref 150–400)
RBC: 3.99 MIL/uL — ABNORMAL LOW (ref 4.22–5.81)
RDW: 12.8 % (ref 11.5–15.5)
WBC: 8.3 10*3/uL (ref 4.0–10.5)
nRBC: 0 % (ref 0.0–0.2)

## 2018-12-27 LAB — URINALYSIS, ROUTINE W REFLEX MICROSCOPIC
Bilirubin Urine: NEGATIVE
Glucose, UA: 50 mg/dL — AB
Ketones, ur: NEGATIVE mg/dL
Nitrite: NEGATIVE
Protein, ur: 100 mg/dL — AB
RBC / HPF: >50 RBC/hpf — ABNORMAL HIGH (ref 0–5)
Specific Gravity, Urine: 1.009 (ref 1.005–1.030)
WBC, UA: >50 WBC/hpf — ABNORMAL HIGH (ref 0–5)
pH: 6 (ref 5.0–8.0)

## 2018-12-27 LAB — I-STAT CHEM 8, ED
BUN: 37 mg/dL — ABNORMAL HIGH (ref 8–23)
Calcium, Ion: 1.21 mmol/L (ref 1.15–1.40)
Chloride: 102 mmol/L (ref 98–111)
Creatinine, Ser: 1.4 mg/dL — ABNORMAL HIGH (ref 0.61–1.24)
Glucose, Bld: 194 mg/dL — ABNORMAL HIGH (ref 70–99)
HCT: 34 % — ABNORMAL LOW (ref 39.0–52.0)
Hemoglobin: 11.6 g/dL — ABNORMAL LOW (ref 13.0–17.0)
Potassium: 4 mmol/L (ref 3.5–5.1)
Sodium: 134 mmol/L — ABNORMAL LOW (ref 135–145)
TCO2: 21 mmol/L — ABNORMAL LOW (ref 22–32)

## 2018-12-27 MED ORDER — LIDOCAINE HCL 1 % IJ SOLN
INTRAMUSCULAR | Status: AC
  Administered 2018-12-27: 20 mL via INTRAMUSCULAR
  Filled 2018-12-27: qty 20

## 2018-12-27 MED ORDER — CEPHALEXIN 500 MG PO CAPS: 500.0000 mg | ORAL_CAPSULE | Freq: Four times a day (QID) | ORAL | 0 refills | Status: DC

## 2018-12-27 MED ORDER — CEFTRIAXONE SODIUM 1 G IJ SOLR
1.0000 g | Freq: Once | INTRAMUSCULAR | Status: AC
  Administered 2018-12-27: 1 g via INTRAMUSCULAR
  Filled 2018-12-27: qty 10

## 2018-12-27 NOTE — ED Triage Notes (Signed)
Patient complaining of urinary retention and urine coming from the side of the catheter. Patient states catheter was doing good until last night. Patient has a little bit of blood in urine. No clots seen.

## 2018-12-27 NOTE — ED Notes (Signed)
Flushed patients foley with 60 cc of sterile water. Patient feels relief. Patient has 180 out so far.

## 2018-12-27 NOTE — ED Provider Notes (Signed)
[facility] DEPT Provider Note   CSN: [id number] Arrival date & time: 12/27/18  [id number]     History   Chief Complaint Chief Complaint  Patient presents with  . Urinary Retention    HPI Casey [name] is a 85 y.o. male.     The history is provided by the patient and a relative.  Illness Location:  Catheter in the bladder Quality:  Obstructed urinating around foley Severity:  Moderate Onset quality:  Gradual Duration:  1 day Timing:  Constant Progression:  Worsening Chronicity:  New Context:  Post op foley placement Relieved by:  Nothing Worsened by:  Time Ineffective treatments:  None Associated symptoms: no chest pain, no diarrhea, no ear pain, no fever, no loss of consciousness, no rash and no shortness of breath   Patient with a foley catheter that became blocked. Patient has been urinating around it.    Past Medical History:  Diagnosis Date  . Anemia   . Arthritis    "all over" all joints  . Bladder cancer (HCC)    bladder  . Blood transfusion 2008  . Chronic kidney disease    bladder ca in 2008;Kid. failure stage 3  . Complication of anesthesia    Ilios post op  . Coronary artery disease   . Diabetes mellitus   . DJD (degenerative joint disease)   . Guaiac + stool 06/28/2017  . Heart murmur   . Hypothyroidism    hyper thyroidism recently  . Ileus ([place]) 2001   post hip replacement  . Metabolic acidosis   . Metabolic bone disease   . Myocardial infarction [facility]) age 40    Patient Active Problem List   Diagnosis Date Noted  . Primary osteoarthritis of left hip 12/03/2018  . Guaiac + stool 06/28/2017  . Diarrhea 08/11/2012  . CAD (coronary artery disease) of artery bypass graft 08/11/2012  . Diabetes ([place]) 08/11/2012  . High cholesterol 08/11/2012    Past Surgical History:  Procedure Laterality Date  . BLADDER SURGERY  2008  . CATARACT EXTRACTION W/ INTRAOCULAR LENS  IMPLANT, BILATERAL Bilateral   .  CHOLECYSTECTOMY    . COLONOSCOPY  09/07/2010   Procedure: COLONOSCOPY;  Surgeon: Rogene Houston, MD;  Location: [facility] ENDO SUITE;  Service: Endoscopy;  Laterality: N/A;  . CORONARY ANGIOPLASTY    . INGUINAL HERNIA REPAIR  2005  . TOTAL HIP ARTHROPLASTY Left 12/23/2018   Procedure: TOTAL HIP ARTHROPLASTY ANTERIOR APPROACH;  Surgeon: Renette Butters, MD;  Location: [facility] ORS;  Service: Orthopedics;  Laterality: Left;        Home Medications    Prior to Admission medications   Medication Sig Start Date End Date Taking? Authorizing Provider  aspirin EC 81 MG tablet Take 1 tablet (81 mg total) by mouth 2 (two) times daily. For DVT prophylaxis for 30 days after surgery. 12/23/18   Martensen, Charna Elizabeth III, PA-C  baclofen (LIORESAL) 10 MG tablet Take 1 tablet (10 mg total) by mouth 2 (two) times daily as needed for muscle spasms. 12/23/18   Prudencio Burly III, PA-C  Choline Fenofibrate (FENOFIBRIC ACID) 135 MG CPDR Take 135 mg by mouth daily.     [provider]  docusate sodium (COLACE) 100 MG capsule Take 1 capsule (100 mg total) by mouth 2 (two) times daily. To prevent constipation while taking pain medication. 12/23/18   Prudencio Burly III, PA-C  finasteride (PROSCAR) 5 MG tablet Take 5 mg by mouth daily. 11/18/18  [provider]  glimepiride (AMARYL) 4 MG tablet Take 4 mg by mouth daily before breakfast.      [provider]  HYDROcodone-acetaminophen (NORCO) 5-325 MG tablet Take 1-2 tablets by mouth every 6 (six) hours as needed for severe pain. 12/23/18   Prudencio Burly III, PA-C  levothyroxine (SYNTHROID) 25 MCG tablet Take 25 mcg by mouth daily before breakfast.    [provider]  lisinopril (ZESTRIL) 2.5 MG tablet Take 2.5 mg by mouth daily.     [provider]  metoprolol succinate (TOPROL-XL) 25 MG 24 hr tablet Take 25 mg by mouth daily.     [provider]  Multiple Vitamin (MULTIVITAMIN) tablet Take 1  tablet by mouth daily.    [provider]  ondansetron (ZOFRAN) 4 MG tablet Take 1 tablet (4 mg total) by mouth every 8 (eight) hours as needed for nausea or vomiting. 12/23/18   Prudencio Burly III, PA-C  sodium bicarbonate 650 MG tablet Take 650 mg by mouth 3 (three) times daily. 11/25/18   [provider]  Tamsulosin HCl (FLOMAX) 0.4 MG CAPS Take 0.4 mg by mouth daily.     [provider]    Family History History reviewed. No pertinent family history.  Social History Social History   Tobacco Use  . Smoking status: Former Smoker    Quit date: 09/07/1990    Years since quitting: 28.3  . Smokeless tobacco: Never Used  Substance Use Topics  . Alcohol use: Yes    Comment: occasional  . Drug use: No     Allergies   Sulfa antibiotics   Review of Systems Review of Systems  Constitutional: Negative for fever.  HENT: Negative for ear pain.   Eyes: Negative for visual disturbance.  Respiratory: Negative for shortness of breath.   Cardiovascular: Negative for chest pain.  Gastrointestinal: Negative for diarrhea.  Genitourinary: Positive for difficulty urinating and dysuria.  Musculoskeletal: Negative for arthralgias.  Skin: Negative for rash.  Neurological: Negative for dizziness and loss of consciousness.  Psychiatric/Behavioral: Negative for agitation.  All other systems reviewed and are negative.    Physical Exam Updated Vital Signs BP (!) 156/88 (BP Location: Left Arm)   Pulse 74   Temp 98.1 F (36.7 C) (Oral)   Resp 16   Ht 5\' 6"  (1.676 m)   Wt 72.6 kg   SpO2 99%   BMI 25.82 kg/m   Physical Exam Vitals signs and nursing note reviewed.  Constitutional:      General: He is not in acute distress.    Appearance: Normal appearance.  HENT:     Head: Normocephalic and atraumatic.     Nose: Nose normal.  Eyes:     Conjunctiva/sclera: Conjunctivae normal.     Pupils: Pupils are equal, round, and reactive to light.  Neck:      Musculoskeletal: Normal range of motion and neck supple.  Cardiovascular:     Rate and Rhythm: Normal rate and regular rhythm.     Pulses: Normal pulses.     Heart sounds: Normal heart sounds.  Pulmonary:     Effort: Pulmonary effort is normal.     Breath sounds: Normal breath sounds.  Abdominal:     General: Abdomen is flat. Bowel sounds are normal.     Tenderness: There is no abdominal tenderness. There is no guarding or rebound.  Musculoskeletal: Normal range of motion.  Skin:    General: Skin is warm and dry.  Capillary Refill: Capillary refill takes less than 2 seconds.  Neurological:     General: No focal deficit present.     Mental Status: He is alert and oriented to person, place, and time.     Deep Tendon Reflexes: Reflexes normal.  Psychiatric:        Mood and Affect: Mood normal.        Behavior: Behavior normal.      ED Treatments / Results  Labs (all labs ordered are listed, but only abnormal results are displayed) Results for orders placed or performed during the hospital encounter of 12/27/18  Urinalysis, Routine w reflex microscopic- may I&O cath if menses  Result Value Ref Range   Color, Urine PENDING YELLOW   APPearance CLOUDY (A) CLEAR   Specific Gravity, Urine 1.009 1.005 - 1.030   pH 6.0 5.0 - 8.0   Glucose, UA 50 (A) NEGATIVE mg/dL   Hgb urine dipstick LARGE (A) NEGATIVE   Bilirubin Urine NEGATIVE NEGATIVE   Ketones, ur NEGATIVE NEGATIVE mg/dL   Protein, ur 100 (A) NEGATIVE mg/dL   Nitrite NEGATIVE NEGATIVE   Leukocytes,Ua LARGE (A) NEGATIVE   RBC / HPF >50 (H) 0 - 5 RBC/hpf   WBC, UA >50 (H) 0 - 5 WBC/hpf   Bacteria, UA FEW (A) NONE SEEN   WBC Clumps PRESENT    Sperm, UA PRESENT   CBC  Result Value Ref Range   WBC 8.3 4.0 - 10.5 K/uL   RBC 3.99 (L) 4.22 - 5.81 MIL/uL   Hemoglobin 11.3 (L) 13.0 - 17.0 g/dL   HCT 35.9 (L) 39.0 - 52.0 %   MCV 90.0 80.0 - 100.0 fL   MCH 28.3 26.0 - 34.0 pg   MCHC 31.5 30.0 - 36.0 g/dL   RDW 12.8 11.5 -  15.5 %   Platelets 276 150 - 400 K/uL   nRBC 0.0 0.0 - 0.2 %  I-stat chem 8, ED (not at [facility] or [facility])  Result Value Ref Range   Sodium 134 (L) 135 - 145 mmol/L   Potassium 4.0 3.5 - 5.1 mmol/L   Chloride 102 98 - 111 mmol/L   BUN 37 (H) 8 - 23 mg/dL   Creatinine, Ser 1.40 (H) 0.61 - 1.24 mg/dL   Glucose, Bld 194 (H) 70 - 99 mg/dL   Calcium, Ion 1.21 1.15 - 1.40 mmol/L   TCO2 21 (L) 22 - 32 mmol/L   Hemoglobin 11.6 (L) 13.0 - 17.0 g/dL   HCT 34.0 (L) 39.0 - 52.0 %   Dg C-arm 1-60 Min-no Report  Result Date: 12/23/2018 CLINICAL DATA:  Left total hip arthroplasty EXAM: OPERATIVE LEFT HIP WITH PELVIS; DG C-ARM 1-60 MIN-NO REPORT OPERATIVE LEFT HIP (WITH PELVIS IF PERFORMED) 2 VIEWS COMPARISON:  None. FLUOROSCOPY TIME:  Fluoroscopy Time:  0 minutes 30 seconds Radiation Exposure Index (if provided by the fluoroscopic device): 3.98 mGy Number of Acquired Spot Images: 2 FINDINGS: Nondiagnostic spot fluoroscopic intraoperative left hip radiographs demonstrate expected postsurgical changes from left total hip arthroplasty on these frontal views. Partially visualized right total hip arthroplasty. IMPRESSION: Intraoperative fluoroscopic guidance for left total hip arthroplasty. Electronically Signed   By: Ilona Sorrel M.D.   On: 12/23/2018 09:30   Dg Hip Operative Unilat W Or W/o Pelvis Left  Result Date: 12/23/2018 CLINICAL DATA:  Left total hip arthroplasty EXAM: OPERATIVE LEFT HIP WITH PELVIS; DG C-ARM 1-60 MIN-NO REPORT OPERATIVE LEFT HIP (WITH PELVIS IF PERFORMED) 2 VIEWS COMPARISON:  None. FLUOROSCOPY TIME:  Fluoroscopy Time:  0 minutes 30 seconds Radiation Exposure Index (if provided by the fluoroscopic device): 3.98 mGy Number of Acquired Spot Images: 2 FINDINGS: Nondiagnostic spot fluoroscopic intraoperative left hip radiographs demonstrate expected postsurgical changes from left total hip arthroplasty on these frontal views. Partially visualized right total hip arthroplasty. IMPRESSION:  Intraoperative fluoroscopic guidance for left total hip arthroplasty. Electronically Signed   By: Ilona Sorrel M.D.   On: 12/23/2018 09:30    Radiology No results found.  Procedures Procedures (including critical care time)  Medications Ordered in ED Medications  cefTRIAXone (ROCEPHIN) injection 1 g (1 g Intramuscular Given 12/27/18 0608)  lidocaine (XYLOCAINE) 1 % (with pres) injection (20 mLs Intramuscular Given 12/27/18 0609)     Initial Impression / Assessment and Plan / ED Course  Catheter was irrigated with flow comparable to bladder scan.  Feels better.  Treated for UTI with rocephin and discharged onj keflex.  Follow up with urology in 7 days for voiding trial.  Patient and daughter verbalize understanding and agree to follow up.   Ahmod Anelli Nogales was evaluated in Emergency Department on 12/27/2018 for the symptoms described in the history of present illness. He was evaluated in the context of the global COVID-19 pandemic, which necessitated consideration that the patient might be at risk for infection with the SARS-CoV-2 virus that causes COVID-19. Institutional protocols and algorithms that pertain to the evaluation of patients at risk for COVID-19 are in a state of rapid change based on information released by regulatory bodies including the CDC and federal and state organizations. These policies and algorithms were followed during the patient's care in the ED.   Final Clinical Impressions(s) / ED Diagnoses   Return for intractable cough, coughing up blood,fevers >100.4 unrelieved by medication, shortness of breath, intractable vomiting, chest pain, shortness of breath, weakness,numbness, changes in speech, facial asymmetry,abdominal pain, passing out,Inability to tolerate liquids or food, cough, altered mental status or any concerns. No signs of systemic illness or infection. The patient is nontoxic-appearing on exam and vital signs are within normal limits.   I have  reviewed the triage vital signs and the nursing notes. Pertinent labs &imaging results that were available during my care of the patient were reviewed by me and considered in my medical decision making (see chart for details).  After history, exam, and medical workup I feel the patient has been appropriately medically screened and is safe for discharge home. Pertinent diagnoses were discussed with the patient. Patient was given return precautions      Bence Trapp, MD 12/27/18 [phone]

## 2018-12-30 LAB — URINE CULTURE
Culture: >=100000 — AB
Special Requests: NORMAL

## 2018-12-31 ENCOUNTER — Telehealth: Payer: Self-pay

## 2018-12-31 NOTE — Progress Notes (Addendum)
ED Antimicrobial Stewardship Positive Culture Follow Up   Casey [name] is an 85 y.o. male who presented to [facility] on 12/27/2018 with a chief complaint of urinary retention. Chief Complaint  Patient presents with  . Urinary Retention    Recent Results (from the past 720 hour(s))  Surgical pcr screen     Status: None   Collection Time: 12/15/18 12:18 PM   Specimen: Nasal Mucosa; Nasal Swab  Result Value Ref Range Status   MRSA, PCR NEGATIVE NEGATIVE Final   Staphylococcus aureus NEGATIVE NEGATIVE Final    Comment: (NOTE) The Xpert SA Assay (FDA approved for NASAL specimens in patients 30 years of age and older), is one component of a comprehensive surveillance program. It is not intended to diagnose infection nor to guide or monitor treatment. Performed at [facility], [place] [address] [place], [place] [address]   SARS CORONAVIRUS 2 (TAT 6-24 HRS) Nasopharyngeal Nasopharyngeal Swab     Status: None   Collection Time: 12/19/18  7:25 AM   Specimen: Nasopharyngeal Swab  Result Value Ref Range Status   SARS Coronavirus 2 NEGATIVE NEGATIVE Final    Comment: (NOTE) SARS-CoV-2 target nucleic acids are NOT DETECTED. The SARS-CoV-2 RNA is generally detectable in upper and lower respiratory specimens during the acute phase of infection. Negative results do not preclude SARS-CoV-2 infection, do not rule out co-infections with other pathogens, and should not be used as the sole basis for treatment or other patient management decisions. Negative results must be combined with clinical observations, patient history, and epidemiological information. The expected result is Negative. Fact Sheet for Patients: SugarRoll.be Fact Sheet for Healthcare Providers: https://www.woods-mathews.com/ This test is not yet approved or cleared by the [place] FDA and  has been authorized for detection and/or diagnosis of SARS-CoV-2  by FDA under an Emergency Use Authorization (EUA). This EUA will remain  in effect (meaning this test can be used) for the duration of the COVID-19 declaration under Section 56 4(b)(1) of the Act, 21 U.S.C. section 360bbb-3(b)(1), unless the authorization is terminated or revoked sooner. Performed at [facility], [place] [address] [place], [place] [address]   Urine C&S     Status: Abnormal   Collection Time: 12/27/18  4:31 AM   Specimen: Urine, Catheterized  Result Value Ref Range Status   Specimen Description   Final    URINE, CATHETERIZED Performed at [facility] [address], [place], [place] [address]    Special Requests   Final    Normal Performed at [facility], [place] [address], [place], [place] [address]    Culture >=100,000 COLONIES/mL PSEUDOMONAS AERUGINOSA (A)  Final   Report Status 12/30/2018 FINAL  Final   Organism ID, Bacteria PSEUDOMONAS AERUGINOSA (A)  Final      Susceptibility   Pseudomonas aeruginosa - MIC*    CEFTAZIDIME 4 SENSITIVE Sensitive     CIPROFLOXACIN <=0.25 SENSITIVE Sensitive     GENTAMICIN <=1 SENSITIVE Sensitive     IMIPENEM 1 SENSITIVE Sensitive     PIP/TAZO <=4 SENSITIVE Sensitive     CEFEPIME <=1 SENSITIVE Sensitive     * >=100,000 COLONIES/mL PSEUDOMONAS AERUGINOSA    [x]  Treated with cephalexin, organism resistant to prescribed antimicrobial []  Patient discharged originally without antimicrobial agent and treatment is now indicated  Plan: 1) d/c cephalexin 2) start ciprofloxacin 500 mg PO twice daily for 7 days  ED Provider: Irena Cords, PA-C   [name] 12/31/2018, 1:24 PM  Clinical Pharmacist [phone]

## 2018-12-31 NOTE — Telephone Encounter (Signed)
Post ED Visit - Positive Culture Follow-up: Successful Patient Follow-Up  Culture assessed and recommendations reviewed by:  []  Elenor Quinones, Pharm.D. []  Heide Guile, Pharm.D., BCPS AQ-ID []  Parks Neptune, Pharm.D., BCPS []  Alycia Rossetti, Pharm.D., BCPS []  [place], Pharm.D., BCPS, AAHIVP []  Legrand Como, Pharm.D., BCPS, AAHIVP []  Salome Arnt, PharmD, BCPS []  Johnnette Gourd, PharmD, BCPS []  Hughes Better, PharmD, BCPS []  Leeroy Cha, PharmD Carmelina Paddock PharmD Positive urine culture  []  Patient discharged without antimicrobial prescription and treatment is now indicated [x]  Organism is resistant to prescribed ED discharge antimicrobial []  Patient with positive blood cultures  Changes discussed with ED provider: Irena Cords PA New antibiotic prescription Cipro 500 mg Bid x 7 days Called to [facility] [phone]  Left on VM  Contacted patient, date 12/31/2018, time 2   Casey [name], [name] 12/31/2018, 1:59 PM

## 2019-03-04 DIAGNOSIS — I639 Cerebral infarction, unspecified: Secondary | ICD-10-CM

## 2019-03-04 HISTORY — DX: Cerebral infarction, unspecified: I63.9

## 2019-03-05 DIAGNOSIS — G459 Transient cerebral ischemic attack, unspecified: Secondary | ICD-10-CM | POA: Diagnosis not present

## 2019-03-05 DIAGNOSIS — Z96643 Presence of artificial hip joint, bilateral: Secondary | ICD-10-CM | POA: Diagnosis not present

## 2019-03-05 DIAGNOSIS — I2510 Atherosclerotic heart disease of native coronary artery without angina pectoris: Secondary | ICD-10-CM | POA: Diagnosis not present

## 2019-03-05 DIAGNOSIS — I639 Cerebral infarction, unspecified: Secondary | ICD-10-CM | POA: Diagnosis not present

## 2019-03-05 DIAGNOSIS — N183 Chronic kidney disease, stage 3 unspecified: Secondary | ICD-10-CM | POA: Diagnosis not present

## 2019-03-05 DIAGNOSIS — N179 Acute kidney failure, unspecified: Secondary | ICD-10-CM | POA: Diagnosis not present

## 2019-03-05 DIAGNOSIS — E860 Dehydration: Secondary | ICD-10-CM | POA: Diagnosis not present

## 2019-03-05 DIAGNOSIS — I252 Old myocardial infarction: Secondary | ICD-10-CM | POA: Diagnosis not present

## 2019-03-05 DIAGNOSIS — I420 Dilated cardiomyopathy: Secondary | ICD-10-CM | POA: Diagnosis not present

## 2019-03-05 DIAGNOSIS — R4701 Aphasia: Secondary | ICD-10-CM | POA: Diagnosis not present

## 2019-03-05 DIAGNOSIS — E1122 Type 2 diabetes mellitus with diabetic chronic kidney disease: Secondary | ICD-10-CM | POA: Diagnosis not present

## 2019-03-05 DIAGNOSIS — Z87891 Personal history of nicotine dependence: Secondary | ICD-10-CM | POA: Diagnosis not present

## 2019-03-05 DIAGNOSIS — I491 Atrial premature depolarization: Secondary | ICD-10-CM | POA: Diagnosis not present

## 2019-03-05 DIAGNOSIS — I129 Hypertensive chronic kidney disease with stage 1 through stage 4 chronic kidney disease, or unspecified chronic kidney disease: Secondary | ICD-10-CM | POA: Diagnosis not present

## 2019-03-05 DIAGNOSIS — E039 Hypothyroidism, unspecified: Secondary | ICD-10-CM | POA: Diagnosis not present

## 2019-03-05 DIAGNOSIS — E782 Mixed hyperlipidemia: Secondary | ICD-10-CM | POA: Diagnosis not present

## 2019-03-05 DIAGNOSIS — R29702 NIHSS score 2: Secondary | ICD-10-CM | POA: Diagnosis not present

## 2019-03-05 DIAGNOSIS — I10 Essential (primary) hypertension: Secondary | ICD-10-CM | POA: Diagnosis not present

## 2019-03-05 DIAGNOSIS — I513 Intracardiac thrombosis, not elsewhere classified: Secondary | ICD-10-CM | POA: Diagnosis not present

## 2019-03-05 DIAGNOSIS — I240 Acute coronary thrombosis not resulting in myocardial infarction: Secondary | ICD-10-CM | POA: Diagnosis not present

## 2019-03-05 DIAGNOSIS — Z96652 Presence of left artificial knee joint: Secondary | ICD-10-CM | POA: Diagnosis not present

## 2019-03-05 DIAGNOSIS — I6340 Cerebral infarction due to embolism of unspecified cerebral artery: Secondary | ICD-10-CM | POA: Diagnosis not present

## 2019-03-05 DIAGNOSIS — I340 Nonrheumatic mitral (valve) insufficiency: Secondary | ICD-10-CM | POA: Diagnosis not present

## 2019-03-05 DIAGNOSIS — E119 Type 2 diabetes mellitus without complications: Secondary | ICD-10-CM | POA: Diagnosis not present

## 2019-03-05 DIAGNOSIS — Z9049 Acquired absence of other specified parts of digestive tract: Secondary | ICD-10-CM | POA: Diagnosis not present

## 2019-03-05 DIAGNOSIS — Z20822 Contact with and (suspected) exposure to covid-19: Secondary | ICD-10-CM | POA: Diagnosis not present

## 2019-03-05 DIAGNOSIS — Z0389 Encounter for observation for other suspected diseases and conditions ruled out: Secondary | ICD-10-CM | POA: Diagnosis not present

## 2019-03-06 DIAGNOSIS — Z0389 Encounter for observation for other suspected diseases and conditions ruled out: Secondary | ICD-10-CM | POA: Diagnosis not present

## 2019-03-06 DIAGNOSIS — E119 Type 2 diabetes mellitus without complications: Secondary | ICD-10-CM | POA: Diagnosis not present

## 2019-03-06 DIAGNOSIS — E039 Hypothyroidism, unspecified: Secondary | ICD-10-CM | POA: Diagnosis not present

## 2019-03-06 DIAGNOSIS — N179 Acute kidney failure, unspecified: Secondary | ICD-10-CM | POA: Diagnosis not present

## 2019-03-06 DIAGNOSIS — I639 Cerebral infarction, unspecified: Secondary | ICD-10-CM | POA: Diagnosis not present

## 2019-03-06 DIAGNOSIS — E1122 Type 2 diabetes mellitus with diabetic chronic kidney disease: Secondary | ICD-10-CM | POA: Diagnosis not present

## 2019-03-06 DIAGNOSIS — E860 Dehydration: Secondary | ICD-10-CM | POA: Diagnosis not present

## 2019-03-06 DIAGNOSIS — I10 Essential (primary) hypertension: Secondary | ICD-10-CM | POA: Diagnosis not present

## 2019-03-06 DIAGNOSIS — G459 Transient cerebral ischemic attack, unspecified: Secondary | ICD-10-CM | POA: Diagnosis not present

## 2019-03-07 DIAGNOSIS — E860 Dehydration: Secondary | ICD-10-CM | POA: Diagnosis not present

## 2019-03-07 DIAGNOSIS — E1122 Type 2 diabetes mellitus with diabetic chronic kidney disease: Secondary | ICD-10-CM | POA: Diagnosis not present

## 2019-03-07 DIAGNOSIS — I639 Cerebral infarction, unspecified: Secondary | ICD-10-CM | POA: Diagnosis not present

## 2019-03-07 DIAGNOSIS — I10 Essential (primary) hypertension: Secondary | ICD-10-CM | POA: Diagnosis not present

## 2019-03-08 DIAGNOSIS — Z7901 Long term (current) use of anticoagulants: Secondary | ICD-10-CM | POA: Diagnosis not present

## 2019-03-08 DIAGNOSIS — Z8673 Personal history of transient ischemic attack (TIA), and cerebral infarction without residual deficits: Secondary | ICD-10-CM | POA: Diagnosis not present

## 2019-03-08 DIAGNOSIS — I10 Essential (primary) hypertension: Secondary | ICD-10-CM | POA: Diagnosis not present

## 2019-03-08 DIAGNOSIS — E039 Hypothyroidism, unspecified: Secondary | ICD-10-CM | POA: Diagnosis not present

## 2019-03-08 DIAGNOSIS — R519 Headache, unspecified: Secondary | ICD-10-CM | POA: Diagnosis not present

## 2019-03-08 DIAGNOSIS — E7800 Pure hypercholesterolemia, unspecified: Secondary | ICD-10-CM | POA: Diagnosis not present

## 2019-03-08 DIAGNOSIS — E119 Type 2 diabetes mellitus without complications: Secondary | ICD-10-CM | POA: Diagnosis not present

## 2019-03-08 DIAGNOSIS — Z79899 Other long term (current) drug therapy: Secondary | ICD-10-CM | POA: Diagnosis not present

## 2019-03-08 DIAGNOSIS — Z7984 Long term (current) use of oral hypoglycemic drugs: Secondary | ICD-10-CM | POA: Diagnosis not present

## 2019-03-23 DIAGNOSIS — E1122 Type 2 diabetes mellitus with diabetic chronic kidney disease: Secondary | ICD-10-CM | POA: Diagnosis not present

## 2019-03-23 DIAGNOSIS — Z09 Encounter for follow-up examination after completed treatment for conditions other than malignant neoplasm: Secondary | ICD-10-CM | POA: Diagnosis not present

## 2019-03-23 DIAGNOSIS — I639 Cerebral infarction, unspecified: Secondary | ICD-10-CM | POA: Diagnosis not present

## 2019-03-23 DIAGNOSIS — E1165 Type 2 diabetes mellitus with hyperglycemia: Secondary | ICD-10-CM | POA: Diagnosis not present

## 2019-03-24 DIAGNOSIS — M25552 Pain in left hip: Secondary | ICD-10-CM | POA: Diagnosis not present

## 2019-03-24 DIAGNOSIS — E119 Type 2 diabetes mellitus without complications: Secondary | ICD-10-CM | POA: Diagnosis not present

## 2019-03-24 DIAGNOSIS — M7062 Trochanteric bursitis, left hip: Secondary | ICD-10-CM | POA: Diagnosis not present

## 2019-04-03 DIAGNOSIS — E119 Type 2 diabetes mellitus without complications: Secondary | ICD-10-CM | POA: Diagnosis not present

## 2019-04-03 DIAGNOSIS — I513 Intracardiac thrombosis, not elsewhere classified: Secondary | ICD-10-CM | POA: Diagnosis not present

## 2019-04-03 DIAGNOSIS — Z7901 Long term (current) use of anticoagulants: Secondary | ICD-10-CM | POA: Diagnosis not present

## 2019-04-03 DIAGNOSIS — E1169 Type 2 diabetes mellitus with other specified complication: Secondary | ICD-10-CM | POA: Diagnosis not present

## 2019-04-03 DIAGNOSIS — I2510 Atherosclerotic heart disease of native coronary artery without angina pectoris: Secondary | ICD-10-CM | POA: Diagnosis not present

## 2019-04-17 DIAGNOSIS — M7062 Trochanteric bursitis, left hip: Secondary | ICD-10-CM | POA: Diagnosis not present

## 2019-04-17 DIAGNOSIS — M545 Low back pain: Secondary | ICD-10-CM | POA: Diagnosis not present

## 2019-04-22 DIAGNOSIS — E119 Type 2 diabetes mellitus without complications: Secondary | ICD-10-CM | POA: Diagnosis not present

## 2019-04-22 DIAGNOSIS — I10 Essential (primary) hypertension: Secondary | ICD-10-CM | POA: Diagnosis not present

## 2019-04-22 DIAGNOSIS — I2510 Atherosclerotic heart disease of native coronary artery without angina pectoris: Secondary | ICD-10-CM | POA: Diagnosis not present

## 2019-04-30 DIAGNOSIS — E1122 Type 2 diabetes mellitus with diabetic chronic kidney disease: Secondary | ICD-10-CM | POA: Diagnosis not present

## 2019-04-30 DIAGNOSIS — Z299 Encounter for prophylactic measures, unspecified: Secondary | ICD-10-CM | POA: Diagnosis not present

## 2019-04-30 DIAGNOSIS — N1832 Chronic kidney disease, stage 3b: Secondary | ICD-10-CM | POA: Diagnosis not present

## 2019-04-30 DIAGNOSIS — I639 Cerebral infarction, unspecified: Secondary | ICD-10-CM | POA: Diagnosis not present

## 2019-04-30 DIAGNOSIS — E1165 Type 2 diabetes mellitus with hyperglycemia: Secondary | ICD-10-CM | POA: Diagnosis not present

## 2019-04-30 DIAGNOSIS — I10 Essential (primary) hypertension: Secondary | ICD-10-CM | POA: Diagnosis not present

## 2019-05-04 DIAGNOSIS — M7110 Other infective bursitis, unspecified site: Secondary | ICD-10-CM | POA: Diagnosis not present

## 2019-05-04 DIAGNOSIS — M7072 Other bursitis of hip, left hip: Secondary | ICD-10-CM | POA: Diagnosis not present

## 2019-05-04 DIAGNOSIS — R509 Fever, unspecified: Secondary | ICD-10-CM | POA: Diagnosis not present

## 2019-05-04 DIAGNOSIS — R197 Diarrhea, unspecified: Secondary | ICD-10-CM | POA: Diagnosis not present

## 2019-05-04 DIAGNOSIS — Z299 Encounter for prophylactic measures, unspecified: Secondary | ICD-10-CM | POA: Diagnosis not present

## 2019-05-07 DIAGNOSIS — I2510 Atherosclerotic heart disease of native coronary artery without angina pectoris: Secondary | ICD-10-CM | POA: Diagnosis not present

## 2019-05-07 DIAGNOSIS — I10 Essential (primary) hypertension: Secondary | ICD-10-CM | POA: Diagnosis not present

## 2019-05-07 DIAGNOSIS — E119 Type 2 diabetes mellitus without complications: Secondary | ICD-10-CM | POA: Diagnosis not present

## 2019-05-08 DIAGNOSIS — Z20822 Contact with and (suspected) exposure to covid-19: Secondary | ICD-10-CM | POA: Diagnosis not present

## 2019-05-08 DIAGNOSIS — M00862 Arthritis due to other bacteria, left knee: Secondary | ICD-10-CM | POA: Diagnosis not present

## 2019-05-08 DIAGNOSIS — Z7901 Long term (current) use of anticoagulants: Secondary | ICD-10-CM | POA: Diagnosis not present

## 2019-05-08 DIAGNOSIS — M009 Pyogenic arthritis, unspecified: Secondary | ICD-10-CM | POA: Diagnosis not present

## 2019-05-08 DIAGNOSIS — Z87891 Personal history of nicotine dependence: Secondary | ICD-10-CM | POA: Diagnosis not present

## 2019-05-08 DIAGNOSIS — Z79899 Other long term (current) drug therapy: Secondary | ICD-10-CM | POA: Diagnosis not present

## 2019-05-08 DIAGNOSIS — Z471 Aftercare following joint replacement surgery: Secondary | ICD-10-CM | POA: Diagnosis not present

## 2019-05-08 DIAGNOSIS — Z96642 Presence of left artificial hip joint: Secondary | ICD-10-CM | POA: Diagnosis not present

## 2019-05-08 DIAGNOSIS — R000 Tachycardia, unspecified: Secondary | ICD-10-CM | POA: Diagnosis not present

## 2019-05-08 DIAGNOSIS — T8452XA Infection and inflammatory reaction due to internal left hip prosthesis, initial encounter: Secondary | ICD-10-CM | POA: Diagnosis not present

## 2019-05-09 ENCOUNTER — Encounter (HOSPITAL_COMMUNITY): Payer: Self-pay | Admitting: Internal Medicine

## 2019-05-09 ENCOUNTER — Inpatient Hospital Stay (HOSPITAL_COMMUNITY)
Admission: AD | Admit: 2019-05-09 | Discharge: 2019-05-14 | DRG: 467 | Disposition: A | Discharge status: Home Health | Payer: Medicare Other | Source: Other Acute Inpatient Hospital | Attending: Internal Medicine | Admitting: Internal Medicine

## 2019-05-09 ENCOUNTER — Other Ambulatory Visit: Payer: Self-pay

## 2019-05-09 ENCOUNTER — Inpatient Hospital Stay (HOSPITAL_COMMUNITY): Payer: Medicare Other

## 2019-05-09 DIAGNOSIS — M009 Pyogenic arthritis, unspecified: Secondary | ICD-10-CM | POA: Diagnosis not present

## 2019-05-09 DIAGNOSIS — N183 Chronic kidney disease, stage 3 unspecified: Secondary | ICD-10-CM | POA: Diagnosis present

## 2019-05-09 DIAGNOSIS — M7989 Other specified soft tissue disorders: Secondary | ICD-10-CM | POA: Diagnosis not present

## 2019-05-09 DIAGNOSIS — I252 Old myocardial infarction: Secondary | ICD-10-CM

## 2019-05-09 DIAGNOSIS — Z9861 Coronary angioplasty status: Secondary | ICD-10-CM

## 2019-05-09 DIAGNOSIS — Z419 Encounter for procedure for purposes other than remedying health state, unspecified: Secondary | ICD-10-CM

## 2019-05-09 DIAGNOSIS — L02416 Cutaneous abscess of left lower limb: Secondary | ICD-10-CM | POA: Diagnosis not present

## 2019-05-09 DIAGNOSIS — B951 Streptococcus, group B, as the cause of diseases classified elsewhere: Secondary | ICD-10-CM | POA: Diagnosis not present

## 2019-05-09 DIAGNOSIS — T8452XA Infection and inflammatory reaction due to internal left hip prosthesis, initial encounter: Secondary | ICD-10-CM | POA: Diagnosis not present

## 2019-05-09 DIAGNOSIS — E039 Hypothyroidism, unspecified: Secondary | ICD-10-CM | POA: Diagnosis not present

## 2019-05-09 DIAGNOSIS — T84018A Broken internal joint prosthesis, other site, initial encounter: Secondary | ICD-10-CM | POA: Diagnosis not present

## 2019-05-09 DIAGNOSIS — Z7982 Long term (current) use of aspirin: Secondary | ICD-10-CM

## 2019-05-09 DIAGNOSIS — Z7984 Long term (current) use of oral hypoglycemic drugs: Secondary | ICD-10-CM | POA: Diagnosis not present

## 2019-05-09 DIAGNOSIS — E119 Type 2 diabetes mellitus without complications: Secondary | ICD-10-CM

## 2019-05-09 DIAGNOSIS — L89152 Pressure ulcer of sacral region, stage 2: Secondary | ICD-10-CM | POA: Diagnosis present

## 2019-05-09 DIAGNOSIS — D631 Anemia in chronic kidney disease: Secondary | ICD-10-CM | POA: Diagnosis present

## 2019-05-09 DIAGNOSIS — I513 Intracardiac thrombosis, not elsewhere classified: Secondary | ICD-10-CM

## 2019-05-09 DIAGNOSIS — R52 Pain, unspecified: Secondary | ICD-10-CM

## 2019-05-09 DIAGNOSIS — N1832 Chronic kidney disease, stage 3b: Secondary | ICD-10-CM | POA: Diagnosis not present

## 2019-05-09 DIAGNOSIS — E871 Hypo-osmolality and hyponatremia: Secondary | ICD-10-CM | POA: Diagnosis not present

## 2019-05-09 DIAGNOSIS — I2510 Atherosclerotic heart disease of native coronary artery without angina pectoris: Secondary | ICD-10-CM | POA: Diagnosis present

## 2019-05-09 DIAGNOSIS — R000 Tachycardia, unspecified: Secondary | ICD-10-CM | POA: Diagnosis not present

## 2019-05-09 DIAGNOSIS — Z20822 Contact with and (suspected) exposure to covid-19: Secondary | ICD-10-CM | POA: Diagnosis present

## 2019-05-09 DIAGNOSIS — I129 Hypertensive chronic kidney disease with stage 1 through stage 4 chronic kidney disease, or unspecified chronic kidney disease: Secondary | ICD-10-CM | POA: Diagnosis not present

## 2019-05-09 DIAGNOSIS — Y831 Surgical operation with implant of artificial internal device as the cause of abnormal reaction of the patient, or of later complication, without mention of misadventure at the time of the procedure: Secondary | ICD-10-CM | POA: Diagnosis present

## 2019-05-09 DIAGNOSIS — M00252 Other streptococcal arthritis, left hip: Secondary | ICD-10-CM | POA: Diagnosis not present

## 2019-05-09 DIAGNOSIS — Z8551 Personal history of malignant neoplasm of bladder: Secondary | ICD-10-CM

## 2019-05-09 DIAGNOSIS — E11649 Type 2 diabetes mellitus with hypoglycemia without coma: Secondary | ICD-10-CM

## 2019-05-09 DIAGNOSIS — I25810 Atherosclerosis of coronary artery bypass graft(s) without angina pectoris: Secondary | ICD-10-CM | POA: Diagnosis not present

## 2019-05-09 DIAGNOSIS — L8990 Pressure ulcer of unspecified site, unspecified stage: Secondary | ICD-10-CM | POA: Insufficient documentation

## 2019-05-09 DIAGNOSIS — T8484XA Pain due to internal orthopedic prosthetic devices, implants and grafts, initial encounter: Secondary | ICD-10-CM

## 2019-05-09 DIAGNOSIS — Z792 Long term (current) use of antibiotics: Secondary | ICD-10-CM

## 2019-05-09 DIAGNOSIS — T8141XA Infection following a procedure, superficial incisional surgical site, initial encounter: Secondary | ICD-10-CM | POA: Diagnosis not present

## 2019-05-09 DIAGNOSIS — Z471 Aftercare following joint replacement surgery: Secondary | ICD-10-CM | POA: Diagnosis not present

## 2019-05-09 DIAGNOSIS — Z79899 Other long term (current) drug therapy: Secondary | ICD-10-CM | POA: Diagnosis not present

## 2019-05-09 DIAGNOSIS — Z87891 Personal history of nicotine dependence: Secondary | ICD-10-CM

## 2019-05-09 DIAGNOSIS — Z7901 Long term (current) use of anticoagulants: Secondary | ICD-10-CM | POA: Diagnosis not present

## 2019-05-09 DIAGNOSIS — D649 Anemia, unspecified: Secondary | ICD-10-CM | POA: Diagnosis present

## 2019-05-09 DIAGNOSIS — E1122 Type 2 diabetes mellitus with diabetic chronic kidney disease: Secondary | ICD-10-CM | POA: Diagnosis present

## 2019-05-09 DIAGNOSIS — Z79890 Hormone replacement therapy (postmenopausal): Secondary | ICD-10-CM

## 2019-05-09 DIAGNOSIS — Z8673 Personal history of transient ischemic attack (TIA), and cerebral infarction without residual deficits: Secondary | ICD-10-CM | POA: Diagnosis not present

## 2019-05-09 DIAGNOSIS — Z8249 Family history of ischemic heart disease and other diseases of the circulatory system: Secondary | ICD-10-CM

## 2019-05-09 DIAGNOSIS — Z96642 Presence of left artificial hip joint: Secondary | ICD-10-CM | POA: Diagnosis not present

## 2019-05-09 DIAGNOSIS — R3915 Urgency of urination: Secondary | ICD-10-CM | POA: Diagnosis not present

## 2019-05-09 HISTORY — DX: Intracardiac thrombosis, not elsewhere classified: I51.3

## 2019-05-09 HISTORY — DX: Pyogenic arthritis, unspecified: M00.9

## 2019-05-09 HISTORY — DX: Chronic kidney disease, stage 3 unspecified: N18.30

## 2019-05-09 LAB — SEDIMENTATION RATE: Sed Rate: 26 mm/hr — ABNORMAL HIGH (ref 0–16)

## 2019-05-09 LAB — CBC WITH DIFFERENTIAL/PLATELET
Abs Immature Granulocytes: 0.39 10*3/uL — ABNORMAL HIGH (ref 0.00–0.07)
Basophils Absolute: 0 10*3/uL (ref 0.0–0.1)
Basophils Relative: 1 %
Eosinophils Absolute: 0.1 10*3/uL (ref 0.0–0.5)
Eosinophils Relative: 1 %
HCT: 25.7 % — ABNORMAL LOW (ref 39.0–52.0)
Hemoglobin: 8 g/dL — ABNORMAL LOW (ref 13.0–17.0)
Immature Granulocytes: 5 %
Lymphocytes Relative: 17 %
Lymphs Abs: 1.4 10*3/uL (ref 0.7–4.0)
MCH: 26.2 pg (ref 26.0–34.0)
MCHC: 31.1 g/dL (ref 30.0–36.0)
MCV: 84.3 fL (ref 80.0–100.0)
Monocytes Absolute: 0.6 10*3/uL (ref 0.1–1.0)
Monocytes Relative: 8 %
Neutro Abs: 5.6 10*3/uL (ref 1.7–7.7)
Neutrophils Relative %: 68 %
Platelets: 433 10*3/uL — ABNORMAL HIGH (ref 150–400)
RBC: 3.05 MIL/uL — ABNORMAL LOW (ref 4.22–5.81)
RDW: 15.7 % — ABNORMAL HIGH (ref 11.5–15.5)
WBC: 8.1 10*3/uL (ref 4.0–10.5)
nRBC: 0 % (ref 0.0–0.2)

## 2019-05-09 LAB — SYNOVIAL CELL COUNT + DIFF, W/ CRYSTALS
Crystals, Fluid: NONE SEEN
Lymphocytes-Synovial Fld: 9 % (ref 0–20)
Neutrophil, Synovial: 91 % — ABNORMAL HIGH (ref 0–25)
WBC, Synovial: 274000 /mm3 — ABNORMAL HIGH (ref 0–200)

## 2019-05-09 LAB — GLUCOSE, CAPILLARY
Glucose-Capillary: 136 mg/dL — ABNORMAL HIGH (ref 70–99)
Glucose-Capillary: 34 mg/dL — CL (ref 70–99)
Glucose-Capillary: 64 mg/dL — ABNORMAL LOW (ref 70–99)
Glucose-Capillary: 105 mg/dL — ABNORMAL HIGH (ref 70–99)
Glucose-Capillary: 149 mg/dL — ABNORMAL HIGH (ref 70–99)
Glucose-Capillary: 131 mg/dL — ABNORMAL HIGH (ref 70–99)

## 2019-05-09 LAB — COMPREHENSIVE METABOLIC PANEL
ALT: 16 U/L (ref 0–44)
AST: 20 U/L (ref 15–41)
Albumin: 2 g/dL — ABNORMAL LOW (ref 3.5–5.0)
Alkaline Phosphatase: 28 U/L — ABNORMAL LOW (ref 38–126)
Anion gap: 9 (ref 5–15)
BUN: 39 mg/dL — ABNORMAL HIGH (ref 8–23)
CO2: 22 mmol/L (ref 22–32)
Calcium: 10.5 mg/dL — ABNORMAL HIGH (ref 8.9–10.3)
Chloride: 102 mmol/L (ref 98–111)
Creatinine, Ser: 1.67 mg/dL — ABNORMAL HIGH (ref 0.61–1.24)
GFR calc Af Amer: 42 mL/min — ABNORMAL LOW (ref >60)
GFR calc non Af Amer: 36 mL/min — ABNORMAL LOW (ref >60)
Glucose, Bld: 52 mg/dL — ABNORMAL LOW (ref 70–99)
Potassium: 3.9 mmol/L (ref 3.5–5.1)
Sodium: 133 mmol/L — ABNORMAL LOW (ref 135–145)
Total Bilirubin: 0.3 mg/dL (ref 0.3–1.2)
Total Protein: 5.1 g/dL — ABNORMAL LOW (ref 6.5–8.1)

## 2019-05-09 LAB — RETICULOCYTES
Immature Retic Fract: 29.9 % — ABNORMAL HIGH (ref 2.3–15.9)
RBC.: 3.03 MIL/uL — ABNORMAL LOW (ref 4.22–5.81)
Retic Count, Absolute: 89.1 10*3/uL (ref 19.0–186.0)
Retic Ct Pct: 2.9 % (ref 0.4–3.1)

## 2019-05-09 LAB — IRON AND TIBC
Iron: 24 ug/dL — ABNORMAL LOW (ref 45–182)
Saturation Ratios: 9 % — ABNORMAL LOW (ref 17.9–39.5)
TIBC: 273 ug/dL (ref 250–450)
UIBC: 249 ug/dL

## 2019-05-09 LAB — SURGICAL PCR SCREEN
MRSA, PCR: NEGATIVE
Staphylococcus aureus: NEGATIVE

## 2019-05-09 LAB — TYPE AND SCREEN
ABO/RH(D): A POS
Antibody Screen: NEGATIVE

## 2019-05-09 LAB — PROTIME-INR
INR: 1.4 — ABNORMAL HIGH (ref 0.8–1.2)
Prothrombin Time: 16.8 seconds — ABNORMAL HIGH (ref 11.4–15.2)

## 2019-05-09 LAB — VITAMIN B12: Vitamin B-12: 329 pg/mL (ref 180–914)

## 2019-05-09 LAB — ABO/RH: ABO/RH(D): A POS

## 2019-05-09 LAB — FOLATE: Folate: 10.9 ng/mL (ref >5.9)

## 2019-05-09 LAB — FERRITIN: Ferritin: 296 ng/mL (ref 24–336)

## 2019-05-09 MED ORDER — TAMSULOSIN HCL 0.4 MG PO CAPS
0.4000 mg | ORAL_CAPSULE | Freq: Every day | ORAL | Status: DC
  Administered 2019-05-09 – 2019-05-14 (×5): 0.4 mg via ORAL
  Filled 2019-05-09 (×6): qty 1

## 2019-05-09 MED ORDER — LEVOTHYROXINE SODIUM 25 MCG PO TABS
25.0000 ug | ORAL_TABLET | Freq: Every day | ORAL | Status: DC
  Administered 2019-05-09 – 2019-05-14 (×5): 25 ug via ORAL
  Filled 2019-05-09 (×6): qty 1

## 2019-05-09 MED ORDER — ONDANSETRON HCL 4 MG/2ML IJ SOLN: 4.0000 mg | Freq: Four times a day (QID) | INTRAMUSCULAR | Status: DC | PRN

## 2019-05-09 MED ORDER — PIPERACILLIN-TAZOBACTAM 3.375 G IVPB
3.3750 g | Freq: Three times a day (TID) | INTRAVENOUS | Status: DC
  Administered 2019-05-09 – 2019-05-10 (×3): 3.375 g via INTRAVENOUS
  Filled 2019-05-09 (×2): qty 50

## 2019-05-09 MED ORDER — VANCOMYCIN HCL 1500 MG/300ML IV SOLN
1500.0000 mg | Freq: Once | INTRAVENOUS | Status: AC
  Administered 2019-05-09: 1500 mg via INTRAVENOUS
  Filled 2019-05-09 (×2): qty 300

## 2019-05-09 MED ORDER — VANCOMYCIN HCL 1250 MG/250ML IV SOLN: 1250.0000 mg | INTRAVENOUS | Status: DC

## 2019-05-09 MED ORDER — MORPHINE SULFATE (PF) 2 MG/ML IV SOLN
1.0000 mg | INTRAVENOUS | Status: DC | PRN
  Administered 2019-05-09 – 2019-05-11 (×4): 1 mg via INTRAVENOUS
  Filled 2019-05-09 (×4): qty 1

## 2019-05-09 MED ORDER — DEXTROSE 50 % IV SOLN
25.0000 g | INTRAVENOUS | Status: AC
  Administered 2019-05-09: 25 g via INTRAVENOUS

## 2019-05-09 MED ORDER — INSULIN ASPART 100 UNIT/ML ~~LOC~~ SOLN: 0.0000 [IU] | SUBCUTANEOUS | Status: DC

## 2019-05-09 MED ORDER — DEXTROSE-NACL 5-0.45 % IV SOLN: INTRAVENOUS | Status: DC

## 2019-05-09 MED ORDER — DEXTROSE 50 % IV SOLN
INTRAVENOUS | Status: AC
  Filled 2019-05-09: qty 50

## 2019-05-09 MED ORDER — FINASTERIDE 5 MG PO TABS
5.0000 mg | ORAL_TABLET | Freq: Every day | ORAL | Status: DC
  Administered 2019-05-09 – 2019-05-14 (×6): 5 mg via ORAL
  Filled 2019-05-09 (×6): qty 1

## 2019-05-09 MED ORDER — METOPROLOL SUCCINATE ER 25 MG PO TB24
25.0000 mg | ORAL_TABLET | Freq: Every day | ORAL | Status: DC
  Administered 2019-05-09 – 2019-05-14 (×6): 25 mg via ORAL
  Filled 2019-05-09 (×6): qty 1

## 2019-05-09 MED ORDER — ONDANSETRON HCL 4 MG PO TABS: 4.0000 mg | ORAL_TABLET | Freq: Four times a day (QID) | ORAL | Status: DC | PRN

## 2019-05-09 MED ORDER — LIDOCAINE HCL (PF) 1 % IJ SOLN
INTRAMUSCULAR | Status: AC
  Filled 2019-05-09: qty 30

## 2019-05-09 MED ORDER — MORPHINE SULFATE (PF) 2 MG/ML IV SOLN
INTRAVENOUS | Status: AC
  Filled 2019-05-09: qty 1

## 2019-05-09 NOTE — Procedures (Signed)
  Procedure: US aspiration L anterior thigh SQ collection 183ml beige opaque thin fluid, sent for GS, C&S EBL:   minimal Complications:  none immediate  See full dictation in [facility].  Dillard Cannon MD Main # [phone] Pager  [phone]

## 2019-05-09 NOTE — Consult Note (Signed)
ORTHOPAEDIC CONSULTATION  REQUESTING PHYSICIAN: Geradine Girt, DO  Chief Complaint: Left hip pain  HPI: Casey [name] is a 86 y.o. male who complains of worsening left hip pain after a fall last Saturday.  Prior to this, he had ongoing nagging lateral hip pain.  His early postoperative course was uneventful.  He returned to [place] and resumed activity including golf but had a stroke.  He was placed on apixaban and says his nagging hip pain started after he was started on this medicine.  Recently was switched to Xarelto with last dose 4/9 in the AM.  He reports fever and chills with temperatures ranging up to 103 per his report.  He was brought to the emergency department in [place] where CAT scan of his left hip showed multi-loculated joint effusion concerning for infection.  He was transferred to [facility] and orthopedics was consulted for evaluation.   Past Medical History:  Diagnosis Date  . Anemia   . Arthritis    "all over" all joints  . Bladder cancer (HCC)    bladder  . Blood transfusion 2008  . Chronic kidney disease    bladder ca in 2008;Kid. failure stage 3  . Complication of anesthesia    Ilios post op  . Coronary artery disease   . Diabetes mellitus   . DJD (degenerative joint disease)   . Guaiac + stool 06/28/2017  . Heart murmur   . Hypothyroidism    hyper thyroidism recently  . Ileus ([place]) 2001   post hip replacement  . Metabolic acidosis   . Metabolic bone disease   . Myocardial infarction [facility]) age 56   Past Surgical History:  Procedure Laterality Date  . BLADDER SURGERY  2008  . CATARACT EXTRACTION W/ INTRAOCULAR LENS  IMPLANT, BILATERAL Bilateral   . CHOLECYSTECTOMY    . COLONOSCOPY  09/07/2010   Procedure: COLONOSCOPY;  Surgeon: Rogene Houston, MD;  Location: [facility];  Service: Endoscopy;  Laterality: N/A;  . CORONARY ANGIOPLASTY    . INGUINAL HERNIA REPAIR  2005  . TOTAL HIP ARTHROPLASTY Left 12/23/2018   Procedure: TOTAL HIP  ARTHROPLASTY ANTERIOR APPROACH;  Surgeon: Renette Butters, MD;  Location: [facility] ORS;  Service: Orthopedics;  Laterality: Left;   Social History   Socioeconomic History  . Marital status: Widowed    Spouse name: Not on file  . Number of children: Not on file  . Years of education: Not on file  . Highest education level: Not on file  Occupational History  . Not on file  Tobacco Use  . Smoking status: Former Smoker    Quit date: 09/07/1990    Years since quitting: 28.6  . Smokeless tobacco: Never Used  Substance and Sexual Activity  . Alcohol use: Yes    Comment: occasional  . Drug use: No  . Sexual activity: Not on file  Other Topics Concern  . Not on file  Social History Narrative  . Not on file   Social Determinants of Health   Financial Resource Strain:   . Difficulty of Paying Living Expenses:   Food Insecurity:   . Worried About Charity fundraiser in the Last Year:   . Arboriculturist in the Last Year:   Transportation Needs:   . Film/video editor (Medical):   Marland Kitchen Lack of Transportation (Non-Medical):   Physical Activity:   . Days of Exercise per Week:   . Minutes of Exercise per Session:  Stress:   . Feeling of Stress :   Social Connections:   . Frequency of Communication with Friends and Family:   . Frequency of Social Gatherings with Friends and Family:   . Attends Religious Services:   . Active Member of Clubs or Organizations:   . Attends Archivist Meetings:   Marland Kitchen Marital Status:    Family History  Problem Relation Age of Onset  . CAD Mother   . CAD Father    Allergies  Allergen Reactions  . Sulfa Antibiotics     unknown   Prior to Admission medications   Medication Sig Start Date End Date Taking? Authorizing Provider  aspirin EC 81 MG tablet Take 1 tablet (81 mg total) by mouth 2 (two) times daily. For DVT prophylaxis for 30 days after surgery. 12/23/18   Martensen, Charna Elizabeth III, PA-C  baclofen (LIORESAL) 10 MG tablet Take 1  tablet (10 mg total) by mouth 2 (two) times daily as needed for muscle spasms. 12/23/18   Martensen, Charna Elizabeth III, PA-C  cephALEXin (KEFLEX) 500 MG capsule Take 1 capsule (500 mg total) by mouth 4 (four) times daily. 12/27/18   Palumbo, April, MD  Choline Fenofibrate (FENOFIBRIC ACID) 135 MG CPDR Take 135 mg by mouth daily.     [provider]  docusate sodium (COLACE) 100 MG capsule Take 1 capsule (100 mg total) by mouth 2 (two) times daily. To prevent constipation while taking pain medication. 12/23/18   Prudencio Burly III, PA-C  finasteride (PROSCAR) 5 MG tablet Take 5 mg by mouth daily. 11/18/18   [provider]  glimepiride (AMARYL) 4 MG tablet Take 4 mg by mouth daily before breakfast.      [provider]  HYDROcodone-acetaminophen (NORCO) 5-325 MG tablet Take 1-2 tablets by mouth every 6 (six) hours as needed for severe pain. 12/23/18   Prudencio Burly III, PA-C  levothyroxine (SYNTHROID) 25 MCG tablet Take 25 mcg by mouth daily before breakfast.    [provider]  lisinopril (ZESTRIL) 2.5 MG tablet Take 2.5 mg by mouth daily.     [provider]  metoprolol succinate (TOPROL-XL) 25 MG 24 hr tablet Take 25 mg by mouth daily.     [provider]  Multiple Vitamin (MULTIVITAMIN) tablet Take 1 tablet by mouth daily.    [provider]  ondansetron (ZOFRAN) 4 MG tablet Take 1 tablet (4 mg total) by mouth every 8 (eight) hours as needed for nausea or vomiting. 12/23/18   Prudencio Burly III, PA-C  sodium bicarbonate 650 MG tablet Take 650 mg by mouth 3 (three) times daily. 11/25/18   [provider]  Tamsulosin HCl (FLOMAX) 0.4 MG CAPS Take 0.4 mg by mouth daily.     [provider]   No results found.  Positive ROS: All other systems have been reviewed and were otherwise negative with the exception of those mentioned in the HPI and as above.  Objective: Labs cbc Recent Labs     05/09/19 0432  WBC 8.1  HGB 8.0*  HCT 25.7*  PLT 433*    Labs inflam No results for input(s): CRP in the last 72 hours.  Invalid input(s): ESR  Labs coag Recent Labs    05/09/19 0432  INR 1.4*    Recent Labs    05/09/19 0432  NA 133*  K 3.9  CL 102  CO2 22  GLUCOSE 52*  BUN 39*  CREATININE 1.67*  CALCIUM 10.5*  Physical Exam: Vitals:   05/09/19 0310  BP: 117/61  Pulse: 72  Resp: 17  Temp: 98.2 F (36.8 C)  SpO2: 100%   General: Alert, no acute distress.  Frail-appearing.  Calm and conversant. Mental status: Alert and Oriented x3 Neurologic: Speech Clear and organized, no gross focal findings or movement disorder appreciated. Respiratory: No cyanosis, no use of accessory musculature Cardiovascular: Regular rate GI: Abdomen is soft and non-tender, non-distended. Skin: Warm and dry.  No lesions in the area of chief complaint  Extremities: Warm and well perfused w/o edema Psychiatric: Patient is competent for consent with normal mood and affect  MUSCULOSKELETAL:  Left hip with primarily lateral/posterior pain with palpation.  There are no lesions erythema or outward sign of infection.  He tolerates small arc range of motion in the hip passively.  EHL, FHL, dorsiflexion, plantarflexion and sensation intact distally. Other extremities are atraumatic with painless ROM and NVI.  Assessment / Plan: Principal Problem:   Septic arthritis ([place]) Active Problems:   CAD (coronary artery disease) of artery bypass graft   LV (left ventricular) mural thrombus   CKD (chronic kidney disease), stage III   Anemia   Left hip pain status post total hip arthroplasty November 2020, fall CT scan reportedly shows multiloculated fluid collection concerning for infection.  Recommend IR guided aspiration.  Hold antibiotics until this is performed. Consider ID consultation pending aspiration findings. I&D washout polyexchange versus explant possible tomorrow again pending  aspiration findings. Bedrest for now N.p.o. midnight  Details, risks, benefits of above-mentioned plan discussed with the patient.  He verbalized understanding and agrees.  Contact information:  Edmonia Lynch MD, Roxan Hockey PA-C  Prudencio Burly III PA-C 05/09/2019 7:32 AM

## 2019-05-09 NOTE — Progress Notes (Signed)
Pharmacy Antibiotic Note  Casey [name] is a 86 y.o. male admitted on 05/09/2019 with septic joint infection.  Pharmacy has been consulted for vancomycin and zosyn dosing.  Transfer from [facility] with CT finding multi-loculated joint effusion concerning for infection - no abx received at OSH. Now s/p US aspiration of L anterior thigh SQ fluid. WBC 8.1, sed rate 26, afebrile. Scr 1.67 (CrCl ~28 mL/min) -has hx CKD.   Plan: Zosyn 3.375g IV q8h (4 hour infusion). Vancomycin 1250 mg IV every 48 hours (estAUC 427)  Monitor renal fx, cx results, clinical pic, and vanc levels as appropriate  Height: 5\' 6"  (167.6 cm) Weight: 73.2 kg (161 lb 6 oz) IBW/kg (Calculated) : 63.8  Temp (24hrs), Avg:98.1 F (36.7 C), Min:97.8 F (36.6 C), Max:98.3 F (36.8 C)  Recent Labs  Lab 05/09/19 0432  WBC 8.1  CREATININE 1.67*    Estimated Creatinine Clearance: 28.1 mL/min (A) (by C-G formula based on SCr of 1.67 mg/dL (H)).    Allergies  Allergen Reactions  . Sulfa Antibiotics     unknown    Antimicrobials this admission: Zosyn 4/10 >>  Vancomycin 4/10 >>   Dose adjustments this admission: N/A  Microbiology results: 4/10 BCx: sent 4/10 Abscess cx: sent   Thank you for allowing pharmacy to be a part of this patient's care.  Antonietta Jewel, PharmD, [place] Clinical Pharmacist  Phone: [phone] 05/09/2019 9:51 PM  Please check AMION for all [place] phone numbers After 10:00 PM, call [place] [phone]

## 2019-05-09 NOTE — Progress Notes (Signed)
Patient with hypoglycemic CBG at 34 this morning. Per MD orders Hypoglycemic sidebar/ protocol implemented. See Mar. 1 Amp of D50 given d/t patient is NPO at this time. Follow up CBG due at 07:15 am. Pt able to converse appropriately, No confusion, sweats or shakes noted.   Day Shift Nurse made aware. Patient maintained NPO and q4 CBG checks. Amion Text sent to update [facility] coverage.

## 2019-05-09 NOTE — Plan of Care (Signed)

## 2019-05-09 NOTE — H&P (Signed)
History and Physical    Casey [name] [id number] DOB: 05-01-1929 DOA: 05/09/2019  PCP: Glenda Chroman, MD  Patient coming from: Patient was transferred from [facility].  Chief Complaint: Left hip pain.  HPI: Casey [name] is a 86 y.o. male with history of diabetes mellitus, hypothyroidism, hypertension, chronic kidney disease stage III, anemia has had a hip surgery with replacement in November 2020.  Following which in February 2021 patient has had a strokelike symptom and was found to have an LV thrombus and was placed on apixaban.  Patient states since then patient has been having some hip pain.  Over the last few weeks the hip pain is worse and had gone to visit his primary care and since patient start developing fever and chills over the last 5 days patient has been taking antibiotics.  Patient's son came to visit him and found that patient was not doing well and brought to the ER at [facility].  In the ER patient had a CT of the hip which shows multiloculated joint effusion concerning for septic arthritis.  On-call orthopedic surgeon at [facility] Dr. Percell Miller was consulted requested IR consult for aspiration before antibiotics.  Transferred to [facility] for further work-up.  Labs are showing creatinine 1.6 hemoglobin 9 WBC and 9.  Patient's last dose of Xarelto was yesterday morning.  ED Course: Patient was a direct admit.  Review of Systems: As per HPI, rest all negative.   Past Medical History:  Diagnosis Date  . Anemia   . Arthritis    "all over" all joints  . Bladder cancer (HCC)    bladder  . Blood transfusion 2008  . Chronic kidney disease    bladder ca in 2008;Kid. failure stage 3  . Complication of anesthesia    Ilios post op  . Coronary artery disease   . Diabetes mellitus   . DJD (degenerative joint disease)   . Guaiac + stool 06/28/2017  . Heart murmur   . Hypothyroidism    hyper thyroidism recently  . Ileus ([place]) 2001   post hip replacement  . Metabolic  acidosis   . Metabolic bone disease   . Myocardial infarction [facility]) age 71    Past Surgical History:  Procedure Laterality Date  . BLADDER SURGERY  2008  . CATARACT EXTRACTION W/ INTRAOCULAR LENS  IMPLANT, BILATERAL Bilateral   . CHOLECYSTECTOMY    . COLONOSCOPY  09/07/2010   Procedure: COLONOSCOPY;  Surgeon: Rogene Houston, MD;  Location: [facility]O [facility];  Service: Endoscopy;  Laterality: N/A;  . CORONARY ANGIOPLASTY    . INGUINAL HERNIA REPAIR  2005  . TOTAL HIP ARTHROPLASTY Left 12/23/2018   Procedure: TOTAL HIP ARTHROPLASTY ANTERIOR APPROACH;  Surgeon: Renette Butters, MD;  Location: [facility] ORS;  Service: Orthopedics;  Laterality: Left;     reports that he quit smoking about 28 years ago. He has never used smokeless tobacco. He reports current alcohol use. He reports that he does not use drugs.  Allergies  Allergen Reactions  . Sulfa Antibiotics     unknown    Family History  Problem Relation Age of Onset  . CAD Mother   . CAD Father     Prior to Admission medications   Medication Sig Start Date End Date Taking? Authorizing Provider  aspirin EC 81 MG tablet Take 1 tablet (81 mg total) by mouth 2 (two) times daily. For DVT prophylaxis for 30 days after surgery. 12/23/18   Prudencio Burly  III, PA-C  baclofen (LIORESAL) 10 MG tablet Take 1 tablet (10 mg total) by mouth 2 (two) times daily as needed for muscle spasms. 12/23/18   Martensen, Charna Elizabeth III, PA-C  cephALEXin (KEFLEX) 500 MG capsule Take 1 capsule (500 mg total) by mouth 4 (four) times daily. 12/27/18   Palumbo, April, MD  Choline Fenofibrate (FENOFIBRIC ACID) 135 MG CPDR Take 135 mg by mouth daily.     [provider]  docusate sodium (COLACE) 100 MG capsule Take 1 capsule (100 mg total) by mouth 2 (two) times daily. To prevent constipation while taking pain medication. 12/23/18   Prudencio Burly III, PA-C  finasteride (PROSCAR) 5 MG tablet Take 5 mg by mouth daily. 11/18/18   [provider]  glimepiride (AMARYL) 4 MG tablet Take 4 mg by mouth daily before breakfast.      [provider]  HYDROcodone-acetaminophen (NORCO) 5-325 MG tablet Take 1-2 tablets by mouth every 6 (six) hours as needed for severe pain. 12/23/18   Prudencio Burly III, PA-C  levothyroxine (SYNTHROID) 25 MCG tablet Take 25 mcg by mouth daily before breakfast.    [provider]  lisinopril (ZESTRIL) 2.5 MG tablet Take 2.5 mg by mouth daily.     [provider]  metoprolol succinate (TOPROL-XL) 25 MG 24 hr tablet Take 25 mg by mouth daily.     [provider]  Multiple Vitamin (MULTIVITAMIN) tablet Take 1 tablet by mouth daily.    [provider]  ondansetron (ZOFRAN) 4 MG tablet Take 1 tablet (4 mg total) by mouth every 8 (eight) hours as needed for nausea or vomiting. 12/23/18   Prudencio Burly III, PA-C  sodium bicarbonate 650 MG tablet Take 650 mg by mouth 3 (three) times daily. 11/25/18   [provider]  Tamsulosin HCl (FLOMAX) 0.4 MG CAPS Take 0.4 mg by mouth daily.     [provider]    Physical Exam: Constitutional: Moderately built and nourished. Vitals:   05/09/19 0310  BP: 117/61  Pulse: 72  Resp: 17  Temp: 98.2 F (36.8 C)  TempSrc: Oral  SpO2: 100%  Weight: 73.2 kg  Height: 5\' 6"  (1.676 m)   Eyes: Anicteric no pallor. ENMT: No discharge from the ears eyes nose or mouth. Neck: No mass or.  No neck rigidity. Respiratory: No rhonchi or crepitations. Cardiovascular: S1-S2 heard. Abdomen: Soft nontender bowel sounds present. Musculoskeletal: No edema.  Pain on moving left hip. Skin: Ecchymotic area of the left hip. Neurologic: Alert awake oriented to time place and person.  Moves all extremities. Psychiatric: Appears normal.  Normal affect.   Labs on Admission: I have personally reviewed following labs and imaging studies  CBC: No results for input(s): WBC, NEUTROABS, HGB, HCT, MCV, PLT in  the last 168 hours. Basic Metabolic Panel: No results for input(s): NA, K, CL, CO2, GLUCOSE, BUN, CREATININE, CALCIUM, MG, PHOS in the last 168 hours. GFR: CrCl cannot be calculated (Patient's most recent lab result is older than the maximum 21 days allowed.). Liver Function Tests: No results for input(s): AST, ALT, ALKPHOS, BILITOT, PROT, ALBUMIN in the last 168 hours. No results for input(s): LIPASE, AMYLASE in the last 168 hours. No results for input(s): AMMONIA in the last 168 hours. Coagulation Profile: No results for input(s): INR, PROTIME in the last 168 hours. Cardiac Enzymes: No results for input(s): CKTOTAL, CKMB, CKMBINDEX, TROPONINI in the last 168 hours. BNP (last 3 results) No results for input(s): PROBNP in  the last 8760 hours. HbA1C: No results for input(s): HGBA1C in the last 72 hours. CBG: No results for input(s): GLUCAP in the last 168 hours. Lipid Profile: No results for input(s): CHOL, HDL, LDLCALC, TRIG, CHOLHDL, LDLDIRECT in the last 72 hours. Thyroid Function Tests: No results for input(s): TSH, T4TOTAL, FREET4, T3FREE, THYROIDAB in the last 72 hours. Anemia Panel: No results for input(s): VITAMINB12, FOLATE, FERRITIN, TIBC, IRON, RETICCTPCT in the last 72 hours. Urine analysis:    Component Value Date/Time   COLORURINE RED (A) 12/27/2018 0431   APPEARANCEUR CLOUDY (A) 12/27/2018 0431   LABSPEC 1.009 12/27/2018 0431   PHURINE 6.0 12/27/2018 0431   GLUCOSEU 50 (A) 12/27/2018 0431   HGBUR LARGE (A) 12/27/2018 0431   BILIRUBINUR NEGATIVE 12/27/2018 0431   KETONESUR NEGATIVE 12/27/2018 0431   PROTEINUR 100 (A) 12/27/2018 0431   NITRITE NEGATIVE 12/27/2018 0431   LEUKOCYTESUR LARGE (A) 12/27/2018 0431   Sepsis Labs: @LABRCNTIP (procalcitonin:4,lacticidven:4) )No results found for this or any previous visit (from the past 240 hour(s)).   Radiological Exams on Admission: No results found.   Assessment/Plan Principal Problem:   Septic arthritis  ([place]) Active Problems:   CAD (coronary artery disease) of artery bypass graft   LV (left ventricular) mural thrombus   CKD (chronic kidney disease), stage III   Anemia    1. Septic arthritis of the left hip for which orthopedic surgeon Dr. Percell Miller has been consulted.  At this time Dr. Percell Miller has requested getting IR consult for aspiration of the left hip joint.  Until then not to start any antibiotics. 2. Recently diagnosed LV apical thrombus on Xarelto was diagnosed in February 2021.  Anticipation of the procedure will hold off for now and start as soon as possible after procedure.  Patient last dose was yesterday morning. 3. Chronic kidney disease stage III creatinine appears to be at baseline. 4. Chronic anemia likely from renal disease.  Follow CBC. 5. Hypothyroidism on Synthroid. 6. Diabetes mellitus type 2 we will keep patient on sliding scale coverage. 7. Nonobstructive CAD denies any chest pain.  Covid test and other labs including CBC metabolic panel are all pending.  I also ordered blood cultures sed rate anemia panel.  Patient is presently n.p.o.   DVT prophylaxis: For now patient will be on SCDs which can be changed to heparin or Xarelto after the procedure. Code Status: Full code. Family Communication: Discussed with patient. Disposition Plan: Home. Consults called: ER physician was transferring discussed with Dr. Percell Miller orthopedic surgeon. Admission status: Inpatient.   Rise Patience MD [facility] Pager [phone].  If 7PM-7AM, please contact night-coverage [email or web address] Password [email or web address]  05/09/2019, 5:00 AM

## 2019-05-09 NOTE — Progress Notes (Signed)
Patient admitted to inpatient after midnight on 4/10.  Plan is for interventional radiology to do aspiration of his hip today.  Appreciate orthopedics following along will start antibiotics after aspiration.  Plan for possible I&D washout in the a.m. pending aspiration findings. Eulogio Bear DO

## 2019-05-09 NOTE — Plan of Care (Signed)
  Problem: Education: Goal: Knowledge of General Education information will improve Description Including pain rating scale, medication(s)/side effects and non-pharmacologic comfort measures Outcome: Progressing   

## 2019-05-09 NOTE — Progress Notes (Signed)
Follow up  CBG resulted at 136. Day shift nurse made aware. Dr. Percell Miller at the Bedside to evaluate and talk with patient.   Tele Box [id number] with  poor transmitting, occasional  warning that Lead set is unplugged. Tele tech Nathala updated. Charge Nurse Checking for available cardiac monitors on other units. None available at this time.  Day shift Nurse made aware.

## 2019-05-10 DIAGNOSIS — T8452XA Infection and inflammatory reaction due to internal left hip prosthesis, initial encounter: Principal | ICD-10-CM

## 2019-05-10 DIAGNOSIS — M009 Pyogenic arthritis, unspecified: Secondary | ICD-10-CM

## 2019-05-10 DIAGNOSIS — Z8673 Personal history of transient ischemic attack (TIA), and cerebral infarction without residual deficits: Secondary | ICD-10-CM

## 2019-05-10 DIAGNOSIS — N183 Chronic kidney disease, stage 3 unspecified: Secondary | ICD-10-CM

## 2019-05-10 DIAGNOSIS — Z96642 Presence of left artificial hip joint: Secondary | ICD-10-CM

## 2019-05-10 DIAGNOSIS — Z7901 Long term (current) use of anticoagulants: Secondary | ICD-10-CM

## 2019-05-10 DIAGNOSIS — E1122 Type 2 diabetes mellitus with diabetic chronic kidney disease: Secondary | ICD-10-CM

## 2019-05-10 DIAGNOSIS — I513 Intracardiac thrombosis, not elsewhere classified: Secondary | ICD-10-CM | POA: Diagnosis not present

## 2019-05-10 DIAGNOSIS — Z87891 Personal history of nicotine dependence: Secondary | ICD-10-CM

## 2019-05-10 DIAGNOSIS — M7989 Other specified soft tissue disorders: Secondary | ICD-10-CM

## 2019-05-10 DIAGNOSIS — R3915 Urgency of urination: Secondary | ICD-10-CM

## 2019-05-10 LAB — CBC
HCT: 26.7 % — ABNORMAL LOW (ref 39.0–52.0)
Hemoglobin: 8.2 g/dL — ABNORMAL LOW (ref 13.0–17.0)
MCH: 26 pg (ref 26.0–34.0)
MCHC: 30.7 g/dL (ref 30.0–36.0)
MCV: 84.8 fL (ref 80.0–100.0)
Platelets: 383 10*3/uL (ref 150–400)
RBC: 3.15 MIL/uL — ABNORMAL LOW (ref 4.22–5.81)
RDW: 15.7 % — ABNORMAL HIGH (ref 11.5–15.5)
WBC: 6.9 10*3/uL (ref 4.0–10.5)
nRBC: 0 % (ref 0.0–0.2)

## 2019-05-10 LAB — GLUCOSE, CAPILLARY
Glucose-Capillary: 116 mg/dL — ABNORMAL HIGH (ref 70–99)
Glucose-Capillary: 234 mg/dL — ABNORMAL HIGH (ref 70–99)
Glucose-Capillary: 88 mg/dL (ref 70–99)
Glucose-Capillary: 89 mg/dL (ref 70–99)
Glucose-Capillary: 89 mg/dL (ref 70–99)
Glucose-Capillary: 93 mg/dL (ref 70–99)
Glucose-Capillary: 98 mg/dL (ref 70–99)

## 2019-05-10 LAB — HEPARIN LEVEL (UNFRACTIONATED): Heparin Unfractionated: <0.1 IU/mL — ABNORMAL LOW (ref 0.30–0.70)

## 2019-05-10 LAB — APTT: aPTT: 32 seconds (ref 24–36)

## 2019-05-10 MED ORDER — HEPARIN BOLUS VIA INFUSION
3000.0000 [IU] | Freq: Once | INTRAVENOUS | Status: AC
  Administered 2019-05-10: 3000 [IU] via INTRAVENOUS
  Filled 2019-05-10: qty 3000

## 2019-05-10 MED ORDER — INSULIN ASPART 100 UNIT/ML ~~LOC~~ SOLN
0.0000 [IU] | Freq: Every day | SUBCUTANEOUS | Status: DC
  Administered 2019-05-12: 3 [IU] via SUBCUTANEOUS
  Administered 2019-05-13: 2 [IU] via SUBCUTANEOUS

## 2019-05-10 MED ORDER — SODIUM CHLORIDE 0.9 % IV SOLN
2.0000 g | INTRAVENOUS | Status: DC
  Administered 2019-05-10: 2 g via INTRAVENOUS
  Filled 2019-05-10 (×2): qty 2

## 2019-05-10 MED ORDER — INSULIN ASPART 100 UNIT/ML ~~LOC~~ SOLN
0.0000 [IU] | Freq: Three times a day (TID) | SUBCUTANEOUS | Status: DC
  Administered 2019-05-10: 3 [IU] via SUBCUTANEOUS
  Administered 2019-05-11: 1 [IU] via SUBCUTANEOUS
  Administered 2019-05-12: 5 [IU] via SUBCUTANEOUS
  Administered 2019-05-13 (×2): 2 [IU] via SUBCUTANEOUS
  Administered 2019-05-13: 1 [IU] via SUBCUTANEOUS
  Administered 2019-05-14: 2 [IU] via SUBCUTANEOUS
  Administered 2019-05-14: 1 [IU] via SUBCUTANEOUS

## 2019-05-10 MED ORDER — HEPARIN (PORCINE) 25000 UT/250ML-% IV SOLN
1300.0000 [IU]/h | INTRAVENOUS | Status: DC
  Administered 2019-05-10: 1200 [IU]/h via INTRAVENOUS
  Administered 2019-05-11: 1300 [IU]/h via INTRAVENOUS
  Filled 2019-05-10 (×2): qty 250

## 2019-05-10 NOTE — Progress Notes (Signed)
Pharmacy Antibiotic Note  Casey [name] is a 86 y.o. male admitted on 05/09/2019 with septic joint infection, started empiric vanc and Zosyn. Transfer from [facility] with CT finding multi-loculated joint effusion concerning for infection - no abx received at OSH. Given patient's history of pseudomonal UTI, will broaden coverage. Pharmacy has been consulted for vancomycin and cefepime dosing.  Now s/p US aspiration of L anterior thigh SQ fluid 4/10 and planning for surgery on Tuesday. WBC 8.1, sed rate 26, afebrile. Scr 1.67 (CrCl ~28 mL/min) -has hx CKD.   Plan: Cefepime 2g IV q24h Vancomycin 1250 mg IV every 48 hours (estAUC 427, SCr 1.67)  Monitor renal fx, cx results, clinical pic, and vanc levels as appropriate  Height: 5\' 6"  (167.6 cm) Weight: 73.2 kg (161 lb 6 oz) IBW/kg (Calculated) : 63.8  Temp (24hrs), Avg:98.2 F (36.8 C), Min:97.8 F (36.6 C), Max:98.5 F (36.9 C)  Recent Labs  Lab 05/09/19 0432  WBC 8.1  CREATININE 1.67*    Estimated Creatinine Clearance: 28.1 mL/min (A) (by C-G formula based on SCr of 1.67 mg/dL (H)).    Allergies  Allergen Reactions  . Sulfa Antibiotics     unknown    Antimicrobials this admission: Zosyn 4/10 >> 4/11 Vancomycin 4/10 >>  Cefepime 4/11>>   Microbiology results: 4/10 MRSA PCR neg 4/10 BCx: ngtd 4/10 Abscess cx: sent    Brendolyn Patty, PharmD PGY2 Pharmacy Resident Phone [phone]  05/10/2019   1:34 PM

## 2019-05-10 NOTE — Plan of Care (Signed)

## 2019-05-10 NOTE — Progress Notes (Addendum)
ANTICOAGULATION CONSULT NOTE - Initial Consult  Pharmacy Consult for heparin Indication: LV apical thrombus  Allergies  Allergen Reactions  . Sulfa Antibiotics     unknown    Patient Measurements: Height: 5\' 6"  (167.6 cm) Weight: 73.2 kg (161 lb 6 oz) IBW/kg (Calculated) : 63.8 HEPARIN DW (KG): 73.2  Vital Signs: Temp: 98.4 F (36.9 C) (04/11 0409) Temp Source: Oral (04/11 0409) BP: 123/61 (04/11 0833) Pulse Rate: 86 (04/11 0833)  Labs: Recent Labs    05/09/19 0432  HGB 8.0*  HCT 25.7*  PLT 433*  LABPROT 16.8*  INR 1.4*  CREATININE 1.67*    Estimated Creatinine Clearance: 28.1 mL/min (A) (by C-G formula based on SCr of 1.67 mg/dL (H)).   Medical History: Past Medical History:  Diagnosis Date  . Anemia   . Arthritis    "all over" all joints  . Bladder cancer (HCC)    bladder  . Blood transfusion 2008  . Chronic kidney disease    bladder ca in 2008;Kid. failure stage 3  . Complication of anesthesia    Ilios post op  . Coronary artery disease   . Diabetes mellitus   . DJD (degenerative joint disease)   . Guaiac + stool 06/28/2017  . Heart murmur   . Hypothyroidism    hyper thyroidism recently  . Ileus ([place]) 2001   post hip replacement  . Metabolic acidosis   . Metabolic bone disease   . Myocardial infarction [facility]) age 56    Assessment: 86 y.o. 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 In February 2021 patient had a strokelike symptom and was found to have an LV thrombus and was initially placed on apixaban and recently switched to rivaroxaban 5mg  BID (patient reports that the prescribed dose was 10mg  once daily; reported last dose morning of 4/9). Pharmacy has been consulted for IV heparin dosing.   Last Hgb 8.0 and platelets 433, no overt bleeding reported. Baseline aPTT, HL, CBC ordered. Will follow both heparin levels and aPTT until levels correlate.  Goal of Therapy:  Heparin level 0.3-0.7  units/ml Monitor platelets by anticoagulation protocol: Yes  Plan:  Heparin 3000 units IV x 1 then start heparin infusion at 1200 units/hr 8h HL/aPTT Monitor CBC, daily heparin level and aPTT until levels correlate Continue to monitor for signs/symptoms of bleeding F/u transition to oral anticoagulant after procedure Tuesday   Jenetta Wease [place], PharmD PGY2 Pharmacy Resident Phone [phone]

## 2019-05-10 NOTE — Consult Note (Signed)
[facility] for Infectious Disease    Date of Admission:  05/09/2019   Total days of antibiotics: 1 vanco/zosyn               Reason for Consult: Prosthetic joint infection L THR    Referring Provider: Percell Miller   Assessment: L prosthetic joint infection Diabetes CVA CKD3  Plan: 1. Spoke with lab- his Cx is being set up.  2. Will change zosyn to cefepime. Reasonable to cover pseudomonas given pssudomonas UTI 11-2018.  3. Check Hgb A1C 4. Await wash out, exchange of parts.  5. Place pic when able.   Thank you so much for this interesting consult,  Principal Problem:   Septic arthritis ([place]) Active Problems:   CAD (coronary artery disease) of artery bypass graft   LV (left ventricular) mural thrombus   CKD (chronic kidney disease), stage III   Anemia   . finasteride  5 mg Oral Daily  . insulin aspart  0-6 Units Subcutaneous Q4H  . levothyroxine  25 mcg Oral QAC breakfast  . metoprolol succinate  25 mg Oral Daily  . tamsulosin  0.4 mg Oral Daily    HPI: Casey [name] is a 86 y.o. male with hx of L THR 11-2018. He then moved to [place] where he was able to resume his normal ADLs (including playing golf). His course was then complicated by CVA [id number], after which he was started on Eliquis.  Within 2 weeks of this, he developed pain in his LLE from his hip to his foot. He also developed swelling.  He began to have fevers (up to 103) around the end of March. He was started on Augmentin by his PCP and his fever resolved. He had IR guided hip aspiration on 05-09-19. 274k WBC. Cx pending. He is now adm for I & D of his hip, exchange of parts.  He was started on vanco/zosyn.   States his FSG have been low until very recently.    Review of Systems: Review of Systems  Constitutional: Positive for fever.  Respiratory: Negative for cough.   Cardiovascular: Positive for leg swelling.  Gastrointestinal: Negative for constipation and diarrhea.  Genitourinary: Positive  for urgency. Negative for dysuria.  Musculoskeletal: Positive for joint pain.  Neurological: Positive for sensory change.  Please see HPI. All other systems reviewed and negative.   Past Medical History:  Diagnosis Date  . Anemia   . Arthritis    "all over" all joints  . Bladder cancer (HCC)    bladder  . Blood transfusion 2008  . Chronic kidney disease    bladder ca in 2008;Kid. failure stage 3  . Complication of anesthesia    Ilios post op  . Coronary artery disease   . Diabetes mellitus   . DJD (degenerative joint disease)   . Guaiac + stool 06/28/2017  . Heart murmur   . Hypothyroidism    hyper thyroidism recently  . Ileus ([place]) 2001   post hip replacement  . Metabolic acidosis   . Metabolic bone disease   . Myocardial infarction [facility]) age 89    Social History   Tobacco Use  . Smoking status: Former Smoker    Quit date: 09/07/1990    Years since quitting: 28.6  . Smokeless tobacco: Never Used  Substance Use Topics  . Alcohol use: Yes    Comment: occasional  . Drug use: No    Family History  Problem Relation Age of Onset  .  CAD Mother   . CAD Father      Medications:  Scheduled: . finasteride  5 mg Oral Daily  . insulin aspart  0-6 Units Subcutaneous Q4H  . levothyroxine  25 mcg Oral QAC breakfast  . metoprolol succinate  25 mg Oral Daily  . tamsulosin  0.4 mg Oral Daily    Abtx:  Anti-infectives (From admission, onward)   Start     Dose/Rate Route Frequency Ordered Stop   05/11/19 2200  vancomycin (VANCOREADY) IVPB 1250 mg/250 mL     1,250 mg 166.7 mL/hr over 90 Minutes Intravenous Every 48 hours 05/09/19 2152     05/09/19 1800  piperacillin-tazobactam (ZOSYN) IVPB 3.375 g     3.375 g 12.5 mL/hr over 240 Minutes Intravenous Every 8 hours 05/09/19 1754     05/09/19 1745  vancomycin (VANCOREADY) IVPB 1500 mg/300 mL     1,500 mg 150 mL/hr over 120 Minutes Intravenous  Once 05/09/19 1754 05/10/19 0135        OBJECTIVE: Blood pressure 123/61,  pulse 86, temperature 98.4 F (36.9 C), temperature source Oral, resp. rate 20, height 5\' 6"  (1.676 m), weight 73.2 kg, SpO2 98 %.  Physical Exam Constitutional:      Appearance: Normal appearance.  HENT:     Mouth/Throat:     Mouth: Mucous membranes are dry.  Eyes:     Extraocular Movements: Extraocular movements intact.     Pupils: Pupils are equal, round, and reactive to light.  Cardiovascular:     Rate and Rhythm: Normal rate and regular rhythm.  Pulmonary:     Effort: Pulmonary effort is normal.     Breath sounds: Normal breath sounds.  Abdominal:     General: Bowel sounds are normal. There is no distension.     Palpations: Abdomen is soft.     Tenderness: There is no abdominal tenderness.  Musculoskeletal:     Cervical back: Normal range of motion and neck supple.     Right lower leg: No edema.     Left lower leg: Edema present.       Legs:  Skin:    General: Skin is warm and dry.  Neurological:     Mental Status: He is alert.  Psychiatric:        Mood and Affect: Mood normal.     Lab Results Results for orders placed or performed during the hospital encounter of 05/09/19 (from the past 48 hour(s))  Surgical pcr screen     Status: None   Collection Time: 05/09/19  3:21 AM   Specimen: Nasal Mucosa; Nasal Swab  Result Value Ref Range   MRSA, PCR NEGATIVE NEGATIVE   Staphylococcus aureus NEGATIVE NEGATIVE    Comment: (NOTE) The Xpert SA Assay (FDA approved for NASAL specimens in patients 29 years of age and older), is one component of a comprehensive surveillance program. It is not intended to diagnose infection nor to guide or monitor treatment. Performed at [facility], [place] [address] [place], [place] [address]   Type and screen [place]     Status: None   Collection Time: 05/09/19  4:19 AM  Result Value Ref Range   ABO/RH(D) A POS    Antibody Screen NEG    Sample Expiration      05/12/2019,2359 Performed at [facility], [place] [address], [place], [place] [address]   ABO/Rh     Status: None   Collection Time: 05/09/19  4:19 AM  Result Value Ref Range   ABO/RH(D)      A POS Performed at [facility] [address], [place], [place] [address]   Comprehensive metabolic panel     Status: Abnormal   Collection Time: 05/09/19  4:32 AM  Result Value Ref Range   Sodium 133 (L) 135 - 145 mmol/L   Potassium 3.9 3.5 - 5.1 mmol/L   Chloride 102 98 - 111 mmol/L   CO2 22 22 - 32 mmol/L   Glucose, Bld 52 (L) 70 - 99 mg/dL    Comment: Glucose reference range applies only to samples taken after fasting for at least 8 hours.   BUN 39 (H) 8 - 23 mg/dL   Creatinine, Ser 1.67 (H) 0.61 - 1.24 mg/dL   Calcium 10.5 (H) 8.9 - 10.3 mg/dL   Total Protein 5.1 (L) 6.5 - 8.1 g/dL   Albumin 2.0 (L) 3.5 - 5.0 g/dL   AST 20 15 - 41 U/L   ALT 16 0 - 44 U/L   Alkaline Phosphatase 28 (L) 38 - 126 U/L   Total Bilirubin 0.3 0.3 - 1.2 mg/dL   GFR calc non Af Amer 36 (L) >60 mL/min   GFR calc Af Amer 42 (L) >60 mL/min   Anion gap 9 5 - 15    Comment: Performed at [facility] [address], [place], [place] [address]  CBC with Differential/Platelet     Status: Abnormal   Collection Time: 05/09/19  4:32 AM  Result Value Ref Range   WBC 8.1 4.0 - 10.5 K/uL   RBC 3.05 (L) 4.22 - 5.81 MIL/uL   Hemoglobin 8.0 (L) 13.0 - 17.0 g/dL   HCT 25.7 (L) 39.0 - 52.0 %   MCV 84.3 80.0 - 100.0 fL   MCH 26.2 26.0 - 34.0 pg   MCHC 31.1 30.0 - 36.0 g/dL   RDW 15.7 (H) 11.5 - 15.5 %   Platelets 433 (H) 150 - 400 K/uL   nRBC 0.0 0.0 - 0.2 %   Neutrophils Relative % 68 %   Neutro Abs 5.6 1.7 - 7.7 K/uL   Lymphocytes Relative 17 %   Lymphs Abs 1.4 0.7 - 4.0 K/uL   Monocytes Relative 8 %   Monocytes Absolute 0.6 0.1 - 1.0 K/uL   Eosinophils Relative 1 %   Eosinophils Absolute 0.1 0.0 - 0.5 K/uL   Basophils Relative 1 %   Basophils Absolute 0.0 0.0 - 0.1 K/uL   Immature Granulocytes 5 %   Abs Immature Granulocytes 0.39  (H) 0.00 - 0.07 K/uL    Comment: Performed at [facility] [address], [place], [place] [address]  Protime-INR     Status: Abnormal   Collection Time: 05/09/19  4:32 AM  Result Value Ref Range   Prothrombin Time 16.8 (H) 11.4 - 15.2 seconds   INR 1.4 (H) 0.8 - 1.2    Comment: (NOTE) INR goal varies based on device and disease states. Performed at [facility], [place] [address], [place], [place] [address]   Sedimentation rate     Status: Abnormal   Collection Time: 05/09/19  4:32 AM  Result Value Ref Range   Sed Rate 26 (H) 0 - 16 mm/hr    Comment: Performed at [facility] [address] [place],  [address]  Vitamin B12     Status: None   Collection Time: 05/09/19  4:32 AM  Result Value Ref Range  Vitamin B-12 329 180 - 914 pg/mL    Comment: (NOTE) This assay is not validated for testing neonatal or myeloproliferative syndrome specimens for Vitamin B12 levels. Performed at [facility], [place] [address] [place], [place] [address]   Folate     Status: None   Collection Time: 05/09/19  4:32 AM  Result Value Ref Range   Folate 10.9 >5.9 ng/mL    Comment: Performed at [facility] [address] [place], [place] [address]  Iron and TIBC     Status: Abnormal   Collection Time: 05/09/19  4:32 AM  Result Value Ref Range   Iron 24 (L) 45 - 182 ug/dL   TIBC 273 250 - 450 ug/dL   Saturation Ratios 9 (L) 17.9 - 39.5 %   UIBC 249 ug/dL    Comment: Performed at [facility], [place] [address], [place], [place] [address]  Ferritin     Status: None   Collection Time: 05/09/19  4:32 AM  Result Value Ref Range   Ferritin 296 24 - 336 ng/mL    Comment: Performed at [facility], [place] [address] [place], [place] [address]  Reticulocytes     Status: Abnormal   Collection Time: 05/09/19  4:32 AM  Result Value Ref Range   Retic Ct Pct 2.9 0.4 - 3.1 %   RBC. 3.03 (L) 4.22 - 5.81 MIL/uL   Retic Count, Absolute 89.1 19.0 - 186.0 K/uL    Immature Retic Fract 29.9 (H) 2.3 - 15.9 %    Comment: Performed at [facility] [address], [place], [place] [address]  Culture, blood (routine x 2)     Status: None (Preliminary result)   Collection Time: 05/09/19  4:33 AM   Specimen: BLOOD  Result Value Ref Range   Specimen Description BLOOD LEFT FOREARM    Special Requests      BOTTLES DRAWN AEROBIC AND ANAEROBIC Blood Culture adequate volume   Culture      NO GROWTH 1 DAY Performed at [facility], [place] [address], [place], [place] [address]    Report Status PENDING   Culture, blood (routine x 2)     Status: None (Preliminary result)   Collection Time: 05/09/19  4:43 AM   Specimen: BLOOD  Result Value Ref Range   Specimen Description BLOOD LEFT HAND    Special Requests      BOTTLES DRAWN AEROBIC ONLY Blood Culture adequate volume   Culture      NO GROWTH 1 DAY Performed at [facility], [place] [address] [place], [place] [address]    Report Status PENDING   Glucose, capillary     Status: Abnormal   Collection Time: 05/09/19  6:46 AM  Result Value Ref Range   Glucose-Capillary 34 (LL) 70 - 99 mg/dL    Comment: Glucose reference range applies only to samples taken after fasting for at least 8 hours.   Comment 1 Document in Chart   Glucose, capillary     Status: Abnormal   Collection Time: 05/09/19  7:28 AM  Result Value Ref Range   Glucose-Capillary 136 (H) 70 - 99 mg/dL    Comment: Glucose reference range applies only to samples taken after fasting for at least 8 hours.  Glucose, capillary     Status: Abnormal   Collection Time: 05/09/19 11:31 AM  Result Value Ref Range   Glucose-Capillary 64 (L) 70 - 99 mg/dL  Comment: Glucose reference range applies only to samples taken after fasting for at least 8 hours.  Glucose, capillary     Status: Abnormal   Collection Time: 05/09/19 12:48 PM  Result Value Ref Range   Glucose-Capillary 105 (H) 70 - 99 mg/dL    Comment: Glucose reference range applies  only to samples taken after fasting for at least 8 hours.  Glucose, capillary     Status: Abnormal   Collection Time: 05/09/19  5:09 PM  Result Value Ref Range   Glucose-Capillary 149 (H) 70 - 99 mg/dL    Comment: Glucose reference range applies only to samples taken after fasting for at least 8 hours.  Synovial cell count + diff, w/ crystals     Status: Abnormal   Collection Time: 05/09/19  6:13 PM  Result Value Ref Range   Color, Synovial BROWN YELLOW   Appearance-Synovial TURBID (A) CLEAR   Crystals, Fluid NO CRYSTALS SEEN    WBC, Synovial 274,000 (H) 0 - 200 /cu mm   Neutrophil, Synovial 91 (H) 0 - 25 %   Lymphocytes-Synovial Fld 9 0 - 20 %   Other Cells-SYN      COUNTS MAY BE INACCURATE DUE TO DISINTEGRATED CELLS HEMATOIDIN CRYSTALS    Comment: Performed at [facility], [place] [address] [place], [place] [address]  Glucose, capillary     Status: Abnormal   Collection Time: 05/09/19  9:01 PM  Result Value Ref Range   Glucose-Capillary 131 (H) 70 - 99 mg/dL    Comment: Glucose reference range applies only to samples taken after fasting for at least 8 hours.   Comment 1 Document in Chart   Glucose, capillary     Status: Abnormal   Collection Time: 05/09/19 11:45 PM  Result Value Ref Range   Glucose-Capillary 116 (H) 70 - 99 mg/dL    Comment: Glucose reference range applies only to samples taken after fasting for at least 8 hours.   Comment 1 Document in Chart   Glucose, capillary     Status: None   Collection Time: 05/10/19  4:10 AM  Result Value Ref Range   Glucose-Capillary 89 70 - 99 mg/dL    Comment: Glucose reference range applies only to samples taken after fasting for at least 8 hours.   Comment 1 Document in Chart   Glucose, capillary     Status: None   Collection Time: 05/10/19  8:29 AM  Result Value Ref Range   Glucose-Capillary 89 70 - 99 mg/dL    Comment: Glucose reference range applies only to samples taken after fasting for at least 8 hours.  Glucose,  capillary     Status: None   Collection Time: 05/10/19 12:06 PM  Result Value Ref Range   Glucose-Capillary 93 70 - 99 mg/dL    Comment: Glucose reference range applies only to samples taken after fasting for at least 8 hours.      Component Value Date/Time   SDES BLOOD LEFT HAND 05/09/2019 0443   SPECREQUEST  05/09/2019 0443    BOTTLES DRAWN AEROBIC ONLY Blood Culture adequate volume   CULT  05/09/2019 0443    NO GROWTH 1 DAY Performed at [facility], [place] [address], [place], [place] [address]    REPTSTATUS PENDING 05/09/2019 0443   [place] FNA SOFT TISSUE  Result Date: 05/10/2019 INDICATION: Multiloculated fluid collections with enhancing walls extend from the left hip arthroplasty and hip joint into the surrounding soft tissues most consistent with infected  collections/abscesses and in keeping with infected arthroplasty.  Aspiration requested EXAM: ULTRASOUND GUIDED ASPIRATION  OF LEFT THIGH COLLECTION MEDICATIONS: Lidocaine 1% subcutaneous ANESTHESIA/SEDATION: None required PROCEDURE: The procedure, risks, benefits, and alternatives were explained to the patient. Questions regarding the procedure were encouraged and answered. The patient understands and consents to the procedure. Survey ultrasound of the anterior proximal left thigh was performed and the dominant collection was localized. An appropriate skin entry site was determined and marked. The operative field was prepped with chlorhexidine in a sterile fashion, and a sterile drape was applied covering the operative field. A sterile gown and sterile gloves were used for the procedure. Local anesthesia was provided with 1% Lidocaine. 7 cm multi sidehole Yueh sheath needle advanced into the collection. 185 mL opaque beige thin fluid were aspirated, sent for Gram stain and culture. The patient tolerated the procedure well. COMPLICATIONS: None immediate. FINDINGS: Hypoechoic anterior proximal left thigh deep subcutaneous collection  localized, measuring approximately 11.2 x 4 x 7.6 cm. 185 mL fluid aspirated as above. No regional visible fluid post aspiration. IMPRESSION: 1. Technically successful ultrasound-guided aspiration, anterior left thigh collection. Electronically Signed   By: Lucrezia Europe M.D.   On: 05/10/2019 08:27   Recent Results (from the past 240 hour(s))  Surgical pcr screen     Status: None   Collection Time: 05/09/19  3:21 AM   Specimen: Nasal Mucosa; Nasal Swab  Result Value Ref Range Status   MRSA, PCR NEGATIVE NEGATIVE Final   Staphylococcus aureus NEGATIVE NEGATIVE Final    Comment: (NOTE) The Xpert SA Assay (FDA approved for NASAL specimens in patients 33 years of age and older), is one component of a comprehensive surveillance program. It is not intended to diagnose infection nor to guide or monitor treatment. Performed at [facility], [place] [address] [place], [place] [address]   Culture, blood (routine x 2)     Status: None (Preliminary result)   Collection Time: 05/09/19  4:33 AM   Specimen: BLOOD  Result Value Ref Range Status   Specimen Description BLOOD LEFT FOREARM  Final   Special Requests   Final    BOTTLES DRAWN AEROBIC AND ANAEROBIC Blood Culture adequate volume   Culture   Final    NO GROWTH 1 DAY Performed at [facility], [place] [address], [place], [place] [address]    Report Status PENDING  Incomplete  Culture, blood (routine x 2)     Status: None (Preliminary result)   Collection Time: 05/09/19  4:43 AM   Specimen: BLOOD  Result Value Ref Range Status   Specimen Description BLOOD LEFT HAND  Final   Special Requests   Final    BOTTLES DRAWN AEROBIC ONLY Blood Culture adequate volume   Culture   Final    NO GROWTH 1 DAY Performed at [facility], [place] [address], [place], [place] [address]    Report Status PENDING  Incomplete    Microbiology: Recent Results (from the past 240 hour(s))  Surgical pcr screen     Status: None   Collection Time: 05/09/19   3:21 AM   Specimen: Nasal Mucosa; Nasal Swab  Result Value Ref Range Status   MRSA, PCR NEGATIVE NEGATIVE Final   Staphylococcus aureus NEGATIVE NEGATIVE Final    Comment: (NOTE) The Xpert SA Assay (FDA approved for NASAL specimens in patients 32 years of age and older), is one component of a comprehensive surveillance program. It is not intended to diagnose infection nor  to guide or monitor treatment. Performed at [facility], [place] [address] [place], [place] [address]   Culture, blood (routine x 2)     Status: None (Preliminary result)   Collection Time: 05/09/19  4:33 AM   Specimen: BLOOD  Result Value Ref Range Status   Specimen Description BLOOD LEFT FOREARM  Final   Special Requests   Final    BOTTLES DRAWN AEROBIC AND ANAEROBIC Blood Culture adequate volume   Culture   Final    NO GROWTH 1 DAY Performed at [facility], [place] [address], [place], [place] [address]    Report Status PENDING  Incomplete  Culture, blood (routine x 2)     Status: None (Preliminary result)   Collection Time: 05/09/19  4:43 AM   Specimen: BLOOD  Result Value Ref Range Status   Specimen Description BLOOD LEFT HAND  Final   Special Requests   Final    BOTTLES DRAWN AEROBIC ONLY Blood Culture adequate volume   Culture   Final    NO GROWTH 1 DAY Performed at [facility], [place] [address], [place], [place] [address]    Report Status PENDING  Incomplete    Radiographs and labs were personally reviewed by me.   Bobby Rumpf, MD [facility] [phone] 05/10/2019, 1:03 PM

## 2019-05-10 NOTE — Progress Notes (Signed)
    Subjective: Patient's discomfort/pressure has decreased since her procedure yesterday.  Dr. Percell Miller discussed plan for surgery Tuesday.  I reviewed this again this afternoon.  Objective:   VITALS:   Vitals:   05/09/19 1508 05/09/19 1919 05/10/19 0409 05/10/19 0833  BP: (!) 128/51 (!) 156/58 132/61 123/61  Pulse: 72 91 79 86  Resp: 18   20  Temp: 97.8 F (36.6 C) 98.5 F (36.9 C) 98.4 F (36.9 C)   TempSrc: Oral Oral Oral   SpO2: 97% 99% 95% 98%  Weight:      Height:       CBC Latest Ref Rng & Units 05/09/2019 12/27/2018 12/27/2018  WBC 4.0 - 10.5 K/uL 8.1 - 8.3  Hemoglobin 13.0 - 17.0 g/dL 8.0(L) 11.6(L) 11.3(L)  Hematocrit 39.0 - 52.0 % 25.7(L) 34.0(L) 35.9(L)  Platelets 150 - 400 K/uL 433(H) - 276   BMP Latest Ref Rng & Units 05/09/2019 12/27/2018 12/15/2018  Glucose 70 - 99 mg/dL 52(L) 194(H) 195(H)  BUN 8 - 23 mg/dL 39(H) 37(H) 43(H)  Creatinine 0.61 - 1.24 mg/dL 1.67(H) 1.40(H) 1.75(H)  Sodium 135 - 145 mmol/L 133(L) 134(L) 133(L)  Potassium 3.5 - 5.1 mmol/L 3.9 4.0 4.5  Chloride 98 - 111 mmol/L 102 102 102  CO2 22 - 32 mmol/L 22 - 22  Calcium 8.9 - 10.3 mg/dL 10.5(H) - 10.0   Intake/Output      04/10 0701 - 04/11 0700 04/11 0701 - 04/12 0700   P.O.     Total Intake(mL/kg)     Urine (mL/kg/hr)     Total Output     Net          Urine Occurrence 2 x    Stool Occurrence 1 x       Physical Exam: General: NAD.  Supine in bed.  Calm, conversant.  MSK RLE: Left hip with primarily lateral/posterior pain with palpation.  There are no lesions erythema or outward sign of infection.  He tolerates small arc range of motion in the hip passively.  EHL, FHL, dorsiflexion, plantarflexion and sensation intact distally. Other extremities are atraumatic with painless ROM and NVI.  Assessment / Plan:     Principal Problem:   Septic arthritis ([place]) Active Problems:   CAD (coronary artery disease) of artery bypass graft   LV (left ventricular) mural thrombus   CKD  (chronic kidney disease), stage [name]   Anemia   Left hip prosthetic joint infection Plan for irrigation debridement and head liner exchange by Dr. Percell Miller on Tuesday. Will need transfer to [facility] for surgery. ID consulted Will need PICC   Incentive Spirometry Weightbearing: WBAT LLE   Contact information:  Edmonia Lynch MD, [place] Hockey PA-C  Dispo:    Casey Elizabeth [place] III, PA-C 05/10/2019, 12:59 PM

## 2019-05-10 NOTE — Progress Notes (Signed)
Progress Note    Casey [name]  [id number] DOB: [birth year]  DOA: 05/09/2019 PCP: Glenda Chroman, MD    Brief Narrative:     Medical records reviewed and are as summarized below:  Casey [name] is an 86 y.o. male with history of diabetes mellitus, hypothyroidism, hypertension, chronic kidney disease stage III, anemia has had a hip surgery with replacement in November 2020.  Following which in February 2021 patient has had a strokelike symptom and was found to have an LV thrombus and was placed on apixaban.  Patient states since then patient has been having some hip pain.  Over the last few weeks the hip pain is worse and had gone to visit his primary care and since patient start developing fever and chills over the last 5 days patient has been taking antibiotics.  Patient's son came to visit him and found that patient was not doing well and brought to the ER at [facility].   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 Percell Miller has been consulted -Ultrasound aspiration of the left anterior thigh subcu collection on 4/10 -WBCs 274,000 -Antibiotics have been started IV while culture is pending -PICC line has been requested -Plan for irrigation and debridement with head liner exchange on Tuesday by Dr. Percell Miller  Recently diagnosed LV apical thrombus on Xarelto was diagnosed in February 2021.   -Appears neck surgery is not until Tuesday so we will start IV heparin for now per pharmacy consult -We will need to resume Xarelto when safe after procedure  Chronic kidney disease stage III  -Baseline GFR around 34  Chronic anemia likely from renal disease -Trend  Hypothyroidism  -on Synthroid.  Mild hyponatremia -A.m. labs  requested  Diabetes mellitus type 2 -SSI  Nonobstructive CAD  -denies any chest pain.   Nursing reports patient is to be transferred to [facility] on Tuesday.  I have reached out to Dr. Percell Miller to see if he plans to take patient on his service as I was unaware of transfer.   Family Communication/Anticipated D/C date and plan/Code Status   DVT prophylaxis: Heparin drip Code Status: Full Code.  Disposition Plan: Per orthopedics patient is to be transferred to [facility] for planned procedure including washout on Tuesday   Medical Consultants:    Ortho.  ID    Subjective:   Patient had aspiration yesterday and discomfort has improved  Objective:    Vitals:   05/09/19 1508 05/09/19 1919 05/10/19 0409 05/10/19 0833  BP: (!) 128/51 (!) 156/58 132/61 123/61  Pulse: 72 91 79 86  Resp: 18   20  Temp: 97.8 F (36.6 C) 98.5 F (36.9 C) 98.4 F (36.9 C)   TempSrc: Oral Oral Oral   SpO2: 97% 99% 95% 98%  Weight:      Height:        Intake/Output Summary (Last 24 hours) at 05/10/2019 1326 Last data filed at 05/10/2019 1300 Gross per 24 hour  Intake 240 ml  Output 2 ml  Net 238 ml   Filed Weights   05/09/19 0310  Weight: 73.2 kg    Exam: In bed, no acute distress Regular rate and rhythm Alert and orient x3 Positive bowel sounds,  soft nontender  Data Reviewed:   I have personally reviewed following labs and imaging studies:  Labs: Labs show the following:   Basic Metabolic Panel: Recent Labs  Lab 05/09/19 0432  NA 133*  K 3.9  CL 102  CO2 22  GLUCOSE 52*  BUN 39*  CREATININE 1.67*  CALCIUM 10.5*   GFR Estimated Creatinine Clearance: 28.1 mL/min (A) (by C-G formula based on SCr of 1.67 mg/dL (H)). Liver Function Tests: Recent Labs  Lab 05/09/19 0432  AST 20  ALT 16  ALKPHOS 28*  BILITOT 0.3  PROT 5.1*  ALBUMIN 2.0*   No results for input(s): LIPASE, AMYLASE in the last 168 hours. No results for input(s): AMMONIA in the last  168 hours. Coagulation profile Recent Labs  Lab 05/09/19 0432  INR 1.4*    CBC: Recent Labs  Lab 05/09/19 0432  WBC 8.1  NEUTROABS 5.6  HGB 8.0*  HCT 25.7*  MCV 84.3  PLT 433*   Cardiac Enzymes: No results for input(s): CKTOTAL, CKMB, CKMBINDEX, TROPONINI in the last 168 hours. BNP (last 3 results) No results for input(s): PROBNP in the last 8760 hours. CBG: Recent Labs  Lab 05/09/19 2101 05/09/19 2345 05/10/19 0410 05/10/19 0829 05/10/19 1206  GLUCAP 131* 116* 89 89 93   D-Dimer: No results for input(s): DDIMER in the last 72 hours. Hgb A1c: No results for input(s): HGBA1C in the last 72 hours. Lipid Profile: No results for input(s): CHOL, HDL, LDLCALC, TRIG, CHOLHDL, LDLDIRECT in the last 72 hours. Thyroid function studies: No results for input(s): TSH, T4TOTAL, T3FREE, THYROIDAB in the last 72 hours.  Invalid input(s): FREET3 Anemia work up: Recent Labs    05/09/19 0432  VITAMINB12 329  FOLATE 10.9  FERRITIN 296  TIBC 273  IRON 24*  RETICCTPCT 2.9   Sepsis Labs: Recent Labs  Lab 05/09/19 0432  WBC 8.1    Microbiology Recent Results (from the past 240 hour(s))  Surgical pcr screen     Status: None   Collection Time: 05/09/19  3:21 AM   Specimen: Nasal Mucosa; Nasal Swab  Result Value Ref Range Status   MRSA, PCR NEGATIVE NEGATIVE Final   Staphylococcus aureus NEGATIVE NEGATIVE Final    Comment: (NOTE) The Xpert SA Assay (FDA approved for NASAL specimens in patients 26 years of age and older), is one component of a comprehensive surveillance program. It is not intended to diagnose infection nor to guide or monitor treatment. Performed at [facility], [place] [address] [place], [place] [address]   Culture, blood (routine x 2)     Status: None (Preliminary result)   Collection Time: 05/09/19  4:33 AM   Specimen: BLOOD  Result Value Ref Range Status   Specimen Description BLOOD LEFT FOREARM  Final   Special Requests   Final     BOTTLES DRAWN AEROBIC AND ANAEROBIC Blood Culture adequate volume   Culture   Final    NO GROWTH 1 DAY Performed at [facility], [place] [address] [place], [place] [address]    Report Status PENDING  Incomplete  Culture, blood (routine x 2)     Status: None (Preliminary result)   Collection Time: 05/09/19  4:43 AM   Specimen: BLOOD  Result Value Ref Range Status   Specimen Description BLOOD LEFT HAND  Final   Special Requests   Final    BOTTLES DRAWN AEROBIC ONLY Blood Culture adequate volume   Culture   Final    NO GROWTH  1 DAY Performed at [facility], [place] [address] [place], [place] [address]    Report Status PENDING  Incomplete    Procedures and diagnostic studies:  [place] FNA SOFT TISSUE  Result Date: 05/10/2019 INDICATION: Multiloculated fluid collections with enhancing walls extend from the left hip arthroplasty and hip joint into the surrounding soft tissues most consistent with infected collections/abscesses and in keeping with infected arthroplasty.  Aspiration requested EXAM: ULTRASOUND GUIDED ASPIRATION  OF LEFT THIGH COLLECTION MEDICATIONS: Lidocaine 1% subcutaneous ANESTHESIA/SEDATION: None required PROCEDURE: The procedure, risks, benefits, and alternatives were explained to the patient. Questions regarding the procedure were encouraged and answered. The patient understands and consents to the procedure. Survey ultrasound of the anterior proximal left thigh was performed and the dominant collection was localized. An appropriate skin entry site was determined and marked. The operative field was prepped with chlorhexidine in a sterile fashion, and a sterile drape was applied covering the operative field. A sterile gown and sterile gloves were used for the procedure. Local anesthesia was provided with 1% Lidocaine. 7 cm multi sidehole Yueh sheath needle advanced into the collection. 185 mL opaque beige thin fluid were aspirated, sent for Gram stain and culture. The  patient tolerated the procedure well. COMPLICATIONS: None immediate. FINDINGS: Hypoechoic anterior proximal left thigh deep subcutaneous collection localized, measuring approximately 11.2 x 4 x 7.6 cm. 185 mL fluid aspirated as above. No regional visible fluid post aspiration. IMPRESSION: 1. Technically successful ultrasound-guided aspiration, anterior left thigh collection. Electronically Signed   By: Lucrezia Europe M.D.   On: 05/10/2019 08:27    Medications:    finasteride  5 mg Oral Daily   insulin aspart  0-6 Units Subcutaneous Q4H   levothyroxine  25 mcg Oral QAC breakfast   metoprolol succinate  25 mg Oral Daily   tamsulosin  0.4 mg Oral Daily   Continuous Infusions:  [START ON 05/11/2019] vancomycin       LOS: 1 day   Geradine Girt  [facility]   How to contact the [facility] Attending or Consulting provider [place] or covering provider during after hours [place], for this patient?  1. Check the care team in [facility] and look for a) attending/consulting [facility] provider listed and b) the [facility] team listed 2. Log into [email or web address] and use [place]'s universal password to access. If you do not have the password, please contact the hospital operator. 3. Locate the [facility] provider you are looking for under [facility] and page to a number that you can be directly reached. 4. If you still have difficulty reaching the provider, please page the [facility] (Director on Call) for the Hospitalists listed on amion for assistance.  05/10/2019, 1:26 PM

## 2019-05-10 NOTE — Progress Notes (Addendum)
Pt aware of transfer to [facility] today. Report given to RN on [facility] is being set up. Will continue to monitor.    58- [facility] has been set up, belongings gathered and pt made aware.  70- [facility] has arrived and is taking pt to [facility] hospital. All necessary paperwork printed off, family made aware at bedside.

## 2019-05-10 NOTE — Progress Notes (Addendum)
Daughter called around 75  asking for an update and stated that she was the POA.  I was in another pt's room at the time  and  I informed her that I would have to call her back   I attempted to call her back about 30 min's later  and the phone went straight to voicemail   Update: daughter returned phone call at about 0430 she was updated on pt's current status a [place] plan

## 2019-05-11 ENCOUNTER — Inpatient Hospital Stay: Payer: Self-pay

## 2019-05-11 DIAGNOSIS — I25810 Atherosclerosis of coronary artery bypass graft(s) without angina pectoris: Secondary | ICD-10-CM

## 2019-05-11 DIAGNOSIS — L8990 Pressure ulcer of unspecified site, unspecified stage: Secondary | ICD-10-CM | POA: Insufficient documentation

## 2019-05-11 DIAGNOSIS — I513 Intracardiac thrombosis, not elsewhere classified: Secondary | ICD-10-CM | POA: Diagnosis not present

## 2019-05-11 DIAGNOSIS — N183 Chronic kidney disease, stage 3 unspecified: Secondary | ICD-10-CM | POA: Diagnosis not present

## 2019-05-11 DIAGNOSIS — M009 Pyogenic arthritis, unspecified: Secondary | ICD-10-CM | POA: Diagnosis not present

## 2019-05-11 HISTORY — DX: Pressure ulcer of unspecified site, unspecified stage: L89.90

## 2019-05-11 LAB — BASIC METABOLIC PANEL
Anion gap: 8 (ref 5–15)
BUN: 36 mg/dL — ABNORMAL HIGH (ref 8–23)
CO2: 24 mmol/L (ref 22–32)
Calcium: 10.2 mg/dL (ref 8.9–10.3)
Chloride: 102 mmol/L (ref 98–111)
Creatinine, Ser: 1.53 mg/dL — ABNORMAL HIGH (ref 0.61–1.24)
GFR calc Af Amer: 47 mL/min — ABNORMAL LOW (ref >60)
GFR calc non Af Amer: 40 mL/min — ABNORMAL LOW (ref >60)
Glucose, Bld: 133 mg/dL — ABNORMAL HIGH (ref 70–99)
Potassium: 4.4 mmol/L (ref 3.5–5.1)
Sodium: 134 mmol/L — ABNORMAL LOW (ref 135–145)

## 2019-05-11 LAB — GLUCOSE, CAPILLARY
Glucose-Capillary: 91 mg/dL (ref 70–99)
Glucose-Capillary: 114 mg/dL — ABNORMAL HIGH (ref 70–99)
Glucose-Capillary: 147 mg/dL — ABNORMAL HIGH (ref 70–99)
Glucose-Capillary: 181 mg/dL — ABNORMAL HIGH (ref 70–99)

## 2019-05-11 LAB — HEMOGLOBIN A1C
Hgb A1c MFr Bld: 7.6 % — ABNORMAL HIGH (ref 4.8–5.6)
Mean Plasma Glucose: 171 mg/dL

## 2019-05-11 LAB — HEPARIN LEVEL (UNFRACTIONATED)
Heparin Unfractionated: 0.28 IU/mL — ABNORMAL LOW (ref 0.30–0.70)
Heparin Unfractionated: 0.26 IU/mL — ABNORMAL LOW (ref 0.30–0.70)
Heparin Unfractionated: 0.45 IU/mL (ref 0.30–0.70)

## 2019-05-11 LAB — PREPARE RBC (CROSSMATCH)

## 2019-05-11 LAB — ABO/RH: ABO/RH(D): A POS

## 2019-05-11 LAB — APTT: aPTT: 109 seconds — ABNORMAL HIGH (ref 24–36)

## 2019-05-11 MED ORDER — VANCOMYCIN HCL 1500 MG/300ML IV SOLN
1500.0000 mg | INTRAVENOUS | Status: DC
  Administered 2019-05-11: 1500 mg via INTRAVENOUS
  Filled 2019-05-11: qty 300

## 2019-05-11 MED ORDER — HEPARIN (PORCINE) 25000 UT/250ML-% IV SOLN
1450.0000 [IU]/h | INTRAVENOUS | Status: DC
  Filled 2019-05-11: qty 250

## 2019-05-11 MED ORDER — SODIUM CHLORIDE 0.9% FLUSH: 10.0000 mL | INTRAVENOUS | Status: DC | PRN

## 2019-05-11 MED ORDER — SODIUM CHLORIDE 0.9 % IV SOLN
2.0000 g | Freq: Two times a day (BID) | INTRAVENOUS | Status: DC
  Administered 2019-05-11 – 2019-05-12 (×3): 2 g via INTRAVENOUS
  Filled 2019-05-11 (×3): qty 2

## 2019-05-11 MED ORDER — CHLORHEXIDINE GLUCONATE CLOTH 2 % EX PADS
6.0000 | MEDICATED_PAD | Freq: Every day | CUTANEOUS | Status: DC
  Administered 2019-05-13 – 2019-05-14 (×2): 6 via TOPICAL

## 2019-05-11 MED ORDER — SODIUM CHLORIDE 0.9% IV SOLUTION: Freq: Once | INTRAVENOUS | Status: DC

## 2019-05-11 NOTE — H&P (View-Only) (Signed)
    Subjective: No significant interval events.  Pharmacy following for heparin - will need to be stopped prior to surgery tomorrow.   Objective:   VITALS:   Vitals:   05/10/19 1709 05/10/19 2024 05/11/19 0155 05/11/19 0529  BP: (!) 135/57 (!) 132/54 (!) 126/54 (!) 138/58  Pulse: (!) 59 82 68 82  Resp: 18 16 14 16   Temp: 97.6 F (36.4 C) 98.4 F (36.9 C) 98 F (36.7 C) 98.2 F (36.8 C)  TempSrc: Oral Oral    SpO2: 98% 98% 96% 95%  Weight:      Height:       CBC Latest Ref Rng & Units 05/10/2019 05/09/2019 12/27/2018  WBC 4.0 - 10.5 K/uL 6.9 8.1 -  Hemoglobin 13.0 - 17.0 g/dL 8.2(L) 8.0(L) 11.6(L)  Hematocrit 39.0 - 52.0 % 26.7(L) 25.7(L) 34.0(L)  Platelets 150 - 400 K/uL 383 433(H) -   BMP Latest Ref Rng & Units 05/09/2019 12/27/2018 12/15/2018  Glucose 70 - 99 mg/dL 52(L) 194(H) 195(H)  BUN 8 - 23 mg/dL 39(H) 37(H) 43(H)  Creatinine 0.61 - 1.24 mg/dL 1.67(H) 1.40(H) 1.75(H)  Sodium 135 - 145 mmol/L 133(L) 134(L) 133(L)  Potassium 3.5 - 5.1 mmol/L 3.9 4.0 4.5  Chloride 98 - 111 mmol/L 102 102 102  CO2 22 - 32 mmol/L 22 - 22  Calcium 8.9 - 10.3 mg/dL 10.5(H) - 10.0   Intake/Output      04/11 0701 - 04/12 0700 04/12 0701 - 04/13 0700   P.O. 240 240   I.V. (mL/kg) 0 (0)    IV Piggyback 229.9    Total Intake(mL/kg) 469.9 (6.4) 240 (3.3)   Urine (mL/kg/hr) 2 (0)    Total Output 2    Net +467.9 +240        Urine Occurrence 2 x 1 x   Stool Occurrence 2 x 1 x     Assessment / Plan:    Principal Problem:   Septic arthritis ([facility]) Active Problems:   CAD (coronary artery disease) of artery bypass graft   LV (left ventricular) mural thrombus   CKD (chronic kidney disease), stage III   Anemia   Left hip prosthetic joint infection Plan for irrigation debridement and head liner exchange by Dr. Percell Miller on Tuesday. ID following PICC Will give 2 units PRBC dt symptomatic anemia and upcoming surgery. Incentive Spirometry  Weightbearing: WBAT LLE  Contact information:   Edmonia Lynch MD, Roxan Hockey PA-C  Dispo: Surgery Tuesday 05/12/19   Prudencio Burly III, PA-C 05/11/2019, 8:45 AM

## 2019-05-11 NOTE — Progress Notes (Signed)
    Subjective: No significant interval events.  Pharmacy following for heparin - will need to be stopped prior to surgery tomorrow.   Objective:   VITALS:   Vitals:   05/10/19 1709 05/10/19 2024 05/11/19 0155 05/11/19 0529  BP: (!) 135/57 (!) 132/54 (!) 126/54 (!) 138/58  Pulse: (!) 59 82 68 82  Resp: 18 16 14 16   Temp: 97.6 F (36.4 C) 98.4 F (36.9 C) 98 F (36.7 C) 98.2 F (36.8 C)  TempSrc: Oral Oral    SpO2: 98% 98% 96% 95%  Weight:      Height:       CBC Latest Ref Rng & Units 05/10/2019 05/09/2019 12/27/2018  WBC 4.0 - 10.5 K/uL 6.9 8.1 -  Hemoglobin 13.0 - 17.0 g/dL 8.2(L) 8.0(L) 11.6(L)  Hematocrit 39.0 - 52.0 % 26.7(L) 25.7(L) 34.0(L)  Platelets 150 - 400 K/uL 383 433(H) -   BMP Latest Ref Rng & Units 05/09/2019 12/27/2018 12/15/2018  Glucose 70 - 99 mg/dL 52(L) 194(H) 195(H)  BUN 8 - 23 mg/dL 39(H) 37(H) 43(H)  Creatinine 0.61 - 1.24 mg/dL 1.67(H) 1.40(H) 1.75(H)  Sodium 135 - 145 mmol/L 133(L) 134(L) 133(L)  Potassium 3.5 - 5.1 mmol/L 3.9 4.0 4.5  Chloride 98 - 111 mmol/L 102 102 102  CO2 22 - 32 mmol/L 22 - 22  Calcium 8.9 - 10.3 mg/dL 10.5(H) - 10.0   Intake/Output      04/11 0701 - 04/12 0700 04/12 0701 - 04/13 0700   P.O. 240 240   I.V. (mL/kg) 0 (0)    IV Piggyback 229.9    Total Intake(mL/kg) 469.9 (6.4) 240 (3.3)   Urine (mL/kg/hr) 2 (0)    Total Output 2    Net +467.9 +240        Urine Occurrence 2 x 1 x   Stool Occurrence 2 x 1 x     Assessment / Plan:    Principal Problem:   Septic arthritis (HCC) Active Problems:   CAD (coronary artery disease) of artery bypass graft   LV (left ventricular) mural thrombus   CKD (chronic kidney disease), stage III   Anemia   Left hip prosthetic joint infection Plan for irrigation debridement and head liner exchange by Dr. Percell Miller on Tuesday. ID following PICC Will give 2 units PRBC dt symptomatic anemia and upcoming surgery. Incentive Spirometry  Weightbearing: WBAT LLE  Contact information:   Edmonia Lynch MD, Roxan Hockey PA-C  Dispo: Surgery Tuesday 05/12/19   Prudencio Burly III, PA-C 05/11/2019, 8:45 AM

## 2019-05-11 NOTE — Progress Notes (Addendum)
PROGRESS NOTE  Casey [name] [id number] DOB: 25-Oct-1929 DOA: 05/09/2019 PCP: Glenda Chroman, MD   LOS: 2 days   Brief narrative: As per HPI,  Casey [name] is an 86 y.o. male with history of diabetes mellitus, hypothyroidism, hypertension, chronic kidney disease stage III, anemia has had a hip surgery with replacement in November 2020.  Following which in February 2021, patient has had a strokelike symptom and was found to have an LV thrombus and was placed on apixaban.  Patient states since then patient has been having some hip pain.  Over the last few weeks, the hip pain is worse and had gone to visit his primary care and since patient start developing fever and chills over the last 5 days patient has been taking antibiotics.  Patient's son came to visit him and found that patient was not doing well and brought to the ER at [facility].   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 Patient has been seen by orthopedics.  Status post aspiration of the joint with findings of septic arthritis.  Continue IV antibiotics, vancomycin and cefepime.  ID has seen the patient and requested PICC line.  Plan for irrigation and debridement on 05/12/2019 by Dr. Percell Miller.    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   Will need to resume anticoagulation after surgical intervention.  Continue metoprolol  Chronic kidney disease stage IIIb  We will closely monitor BMP.   Chronic anemia likely from renal disease Monitor CBC closely transfuse as necessary.  On tamsulosin.   Hypothyroidism  -on Synthroid, continue.   Mild hyponatremia Check BMP   Diabetes mellitus type 2 Continue  sliding scale insulin, Accu-Cheks diabetic diet.   Nonobstructive CAD  No chest pain at this time.    VTE Prophylaxis: Heparin drip  Code Status: Full code  Family Communication: I tried to call the patient's daughter Ms. Melody on the phone number listed but was unable to reach due to error  Disposition Plan:  . Patient is from home . Likely disposition to home with home health . Barriers to discharge: Pending surgical intervention, IV antibiotics, PICC line placement   Consultants:  Orthopedics  Infectious disease  Procedures:  Joint aspiration left hip  Antibiotics:  . Vancomycin and cefepime  Anti-infectives (From admission, onward)    Start     Dose/Rate Route Frequency Ordered Stop   05/11/19 2200  vancomycin (VANCOREADY) IVPB 1250 mg/250 mL     1,250 mg 166.7 mL/hr over 90 Minutes Intravenous Every 48 hours 05/09/19 2152     05/10/19 1400  ceFEPIme (MAXIPIME) 2 g in sodium chloride 0.9 % 100 mL IVPB     2 g 200 mL/hr over 30 Minutes Intravenous Every 24 hours 05/10/19 1339     05/09/19 1800  piperacillin-tazobactam (ZOSYN) IVPB 3.375 g  Status:  Discontinued     3.375 g 12.5 mL/hr over 240 Minutes Intravenous Every 8 hours 05/09/19 1754 05/10/19 1318   05/09/19 1745  vancomycin (VANCOREADY) IVPB 1500 mg/300 mL     1,500 mg 150 mL/hr over 120 Minutes Intravenous  Once 05/09/19 1754 05/10/19 0135       Subjective: Today, patient was  seen and examined at bedside.  Complains of left hip pain.  Denies any shortness of breath, fever, chills or rigor.  No history of nausea, vomiting, shortness of breath.  Objective: Vitals:   05/11/19 0529 05/11/19 0916  BP: (!) 138/58 128/63  Pulse: 82 85  Resp: 16 16  Temp: 98.2 F (36.8 C) 97.6 F (36.4 C)  SpO2: 95% 98%    Intake/Output Summary (Last 24 hours) at 05/11/2019 1039 Last data filed at 05/11/2019 0808 Gross per 24 hour  Intake 709.91 ml  Output 1 ml  Net 708.91 ml   Filed Weights   05/09/19 0310    Weight: 73.2 kg   Body mass index is 26.05 kg/m.   Physical Exam: GENERAL: Patient is alert awake and oriented. Not in obvious distress. HENT: No scleral pallor or icterus. Pupils equally reactive to light. Oral mucosa is moist NECK: is supple, no gross swelling noted. CHEST: Clear to auscultation. No crackles or wheezes.  Diminished breath sounds bilaterally. CVS: S1 and S2 heard, no murmur. Regular rate and rhythm.  ABDOMEN: Soft, non-tender, bowel sounds are present. EXTREMITIES: No edema.  Left hip with incision scar, tenderness on palpation.  Bandage at the site of aspiration. CNS: Cranial nerves are intact. No focal motor deficits. SKIN: warm and dry without rashes.  Data Review: I have personally reviewed the following laboratory data and studies,  CBC: Recent Labs  Lab 05/09/19 0432 05/10/19 1533  WBC 8.1 6.9  NEUTROABS 5.6  --   HGB 8.0* 8.2*  HCT 25.7* 26.7*  MCV 84.3 84.8  PLT 433* [id number]   Basic Metabolic Panel: Recent Labs  Lab 05/09/19 0432  NA 133*  K 3.9  CL 102  CO2 22  GLUCOSE 52*  BUN 39*  CREATININE 1.67*  CALCIUM 10.5*   Liver Function Tests: Recent Labs  Lab 05/09/19 0432  AST 20  ALT 16  ALKPHOS 28*  BILITOT 0.3  PROT 5.1*  ALBUMIN 2.0*   No results for input(s): LIPASE, AMYLASE in the last 168 hours. No results for input(s): AMMONIA in the last 168 hours. Cardiac Enzymes: No results for input(s): CKTOTAL, CKMB, CKMBINDEX, TROPONINI in the last 168 hours. BNP (last 3 results) No results for input(s): BNP in the last 8760 hours.  ProBNP (last 3 results) No results for input(s): PROBNP in the last 8760 hours.  CBG: Recent Labs  Lab 05/10/19 1206 05/10/19 1643 05/10/19 2019 05/10/19 2220 05/11/19 0736  GLUCAP 93 234* 88 98 91   Recent Results (from the past 240 hour(s))  Surgical pcr screen     Status: None   Collection Time: 05/09/19  3:21 AM   Specimen: Nasal Mucosa; Nasal Swab  Result Value Ref Range Status   MRSA,  PCR NEGATIVE NEGATIVE Final   Staphylococcus aureus NEGATIVE NEGATIVE Final    Comment: (NOTE) The Xpert SA Assay (FDA approved for NASAL specimens in patients 72 years of age and older), is one component of a comprehensive surveillance program. It is not intended to diagnose infection nor to guide or monitor treatment. Performed at [facility], [place] [address] [place], [place] [address]   Culture, blood (routine x 2)     Status: None (Preliminary result)   Collection Time: 05/09/19  4:33 AM   Specimen: BLOOD  Result Value Ref Range Status   Specimen Description BLOOD LEFT FOREARM  Final   Special Requests   Final    BOTTLES DRAWN AEROBIC AND ANAEROBIC Blood Culture adequate volume  Culture   Final    NO GROWTH 2 DAYS Performed at [facility], [place] [address] [place], [place] [address]    Report Status PENDING  Incomplete  Culture, blood (routine x 2)     Status: None (Preliminary result)   Collection Time: 05/09/19  4:43 AM   Specimen: BLOOD  Result Value Ref Range Status   Specimen Description BLOOD LEFT HAND  Final   Special Requests   Final    BOTTLES DRAWN AEROBIC ONLY Blood Culture adequate volume   Culture   Final    NO GROWTH 2 DAYS Performed at [facility], [place] [address], [place], [place] [address]    Report Status PENDING  Incomplete  Aerobic/Anaerobic Culture (surgical/deep wound)     Status: None (Preliminary result)   Collection Time: 05/09/19  5:06 PM   Specimen: Wound; Abscess  Result Value Ref Range Status   Specimen Description WOUND LEFT THIGH US ASPIRATION  Final   Special Requests NONE  Final   Gram Stain   Final    ABUNDANT WBC PRESENT, PREDOMINANTLY PMN NO ORGANISMS SEEN    Culture   Final    CULTURE REINCUBATED FOR BETTER GROWTH Performed at [facility], [place] [address] [place], [place] [address]    Report Status PENDING  Incomplete     Studies: [place] FNA SOFT TISSUE  Result Date: 05/10/2019 INDICATION:  Multiloculated fluid collections with enhancing walls extend from the left hip arthroplasty and hip joint into the surrounding soft tissues most consistent with infected collections/abscesses and in keeping with infected arthroplasty.  Aspiration requested EXAM: ULTRASOUND GUIDED ASPIRATION  OF LEFT THIGH COLLECTION MEDICATIONS: Lidocaine 1% subcutaneous ANESTHESIA/SEDATION: None required PROCEDURE: The procedure, risks, benefits, and alternatives were explained to the patient. Questions regarding the procedure were encouraged and answered. The patient understands and consents to the procedure. Survey ultrasound of the anterior proximal left thigh was performed and the dominant collection was localized. An appropriate skin entry site was determined and marked. The operative field was prepped with chlorhexidine in a sterile fashion, and a sterile drape was applied covering the operative field. A sterile gown and sterile gloves were used for the procedure. Local anesthesia was provided with 1% Lidocaine. 7 cm multi sidehole Yueh sheath needle advanced into the collection. 185 mL opaque beige thin fluid were aspirated, sent for Gram stain and culture. The patient tolerated the procedure well. COMPLICATIONS: None immediate. FINDINGS: Hypoechoic anterior proximal left thigh deep subcutaneous collection localized, measuring approximately 11.2 x 4 x 7.6 cm. 185 mL fluid aspirated as above. No regional visible fluid post aspiration. IMPRESSION: 1. Technically successful ultrasound-guided aspiration, anterior left thigh collection. Electronically Signed   By: Lucrezia Europe M.D.   On: 05/10/2019 08:27   [place] EKG SITE RITE  Result Date: 05/11/2019 If Site Rite image not attached, placement could not be confirmed due to current cardiac rhythm.     Flora Lipps, MD  [facility] 05/11/2019

## 2019-05-11 NOTE — Progress Notes (Signed)
ANTICOAGULATION CONSULT NOTE - Follow Up Consult  Pharmacy Consult for Heparin Indication: LV apical thrombus  Allergies  Allergen Reactions  . Sulfa Antibiotics     unknown    Patient Measurements: Height: 5\' 6"  (167.6 cm) Weight: 73.2 kg (161 lb 6 oz) IBW/kg (Calculated) : 63.8 Heparin Dosing Weight:   Vital Signs: Temp: 98 F (36.7 C) (04/12 0155) Temp Source: Oral (04/11 2024) BP: 126/54 (04/12 0155) Pulse Rate: 68 (04/12 0155)  Labs: Recent Labs    05/09/19 0432 05/10/19 1533 05/11/19 0035  HGB 8.0* 8.2*  --   HCT 25.7* 26.7*  --   PLT 433* 383  --   APTT  --  32 109*  LABPROT 16.8*  --   --   INR 1.4*  --   --   HEPARINUNFRC  --  <0.10* 0.28*  CREATININE 1.67*  --   --     Estimated Creatinine Clearance: 28.1 mL/min (A) (by C-G formula based on SCr of 1.67 mg/dL (H)).   Medications:  Infusions:  . ceFEPime (MAXIPIME) IV Stopped (05/10/19 1430)  . heparin 1,200 Units/hr (05/10/19 1626)  . vancomycin      Assessment: Patient with low heparin level and high PTT.  These two values do not correlate.  Therefore will use heparin level going forward as the heparin level does not appear to be falsely elevated due to prior DOAC use.  Goal of Therapy:  Heparin level 0.3-0.7 units/ml Monitor platelets by anticoagulation protocol: Yes   Plan:  Increase heparin to 1300 units/hr Recheck heparin level at [address], [place] 05/11/2019,3:00 AM

## 2019-05-11 NOTE — Progress Notes (Signed)
Peripherally Inserted Central Catheter Placement  The IV Nurse has discussed with the patient and/or persons authorized to consent for the patient, the purpose of this procedure and the potential benefits and risks involved with this procedure.  The benefits include less needle sticks, lab draws from the catheter, and the patient may be discharged home with the catheter. Risks include, but not limited to, infection, bleeding, blood clot (thrombus formation), and puncture of an artery; nerve damage and irregular heartbeat and possibility to perform a PICC exchange if needed/ordered by physician.  Alternatives to this procedure were also discussed.  Bard Power PICC patient education guide, fact sheet on infection prevention and patient information card has been provided to patient /or left at bedside.    PICC Placement Documentation  PICC Single Lumen 05/11/19 PICC Right Cephalic 37 cm 1 cm (Active)  Indication for Insertion or Continuance of Line Home intravenous therapies (PICC only) 05/11/19 1509  Exposed Catheter (cm) 1 cm 05/11/19 1509  Site Assessment Clean;Dry;Intact 05/11/19 1509  Line Status Flushed;Saline locked;Blood return noted 05/11/19 1509  Dressing Type Transparent;Securing device 05/11/19 1509  Dressing Status Clean;Dry;Intact;Antimicrobial disc in place 05/11/19 1509  Dressing Intervention New dressing 05/11/19 1509  Dressing Change Due 05/18/19 05/11/19 [place], Kortney Potvin 05/11/2019, 3:11 PM

## 2019-05-11 NOTE — Progress Notes (Signed)
ANTICOAGULATION CONSULT NOTE - Follow Up Consult  Pharmacy Consult for heparin Indication: LV apical thrombus  Allergies  Allergen Reactions  . Sulfa Antibiotics     unknown    Patient Measurements: Height: 5\' 6"  (167.6 cm) Weight: 73.2 kg (161 lb 6 oz) IBW/kg (Calculated) : 63.8 HEPARIN DW (KG): 73.2  Vital Signs: Temp: 98.3 F (36.8 C) (04/12 2108) Temp Source: Oral (04/12 2108) BP: 146/52 (04/12 2108) Pulse Rate: 66 (04/12 2108)  Labs: Recent Labs    05/09/19 0432 05/10/19 1533 05/10/19 1533 05/11/19 0035 05/11/19 1200 05/11/19 2247  HGB 8.0* 8.2*  --   --   --   --   HCT 25.7* 26.7*  --   --   --   --   PLT 433* 383  --   --   --   --   APTT  --  32  --  109*  --   --   LABPROT 16.8*  --   --   --   --   --   INR 1.4*  --   --   --   --   --   HEPARINUNFRC  --  <0.10*   < > 0.28* 0.26* 0.45  CREATININE 1.67*  --   --   --  1.53*  --    < > = values in this interval not displayed.    Estimated Creatinine Clearance: 30.7 mL/min (A) (by C-G formula based on SCr of 1.53 mg/dL (H)).   Medical History: Past Medical History:  Diagnosis Date  . Anemia   . Arthritis    "all over" all joints  . Bladder cancer (HCC)    bladder  . Blood transfusion 2008  . Chronic kidney disease    bladder ca in 2008;Kid. failure stage 3  . Complication of anesthesia    Ilios post op  . Coronary artery disease   . Diabetes mellitus   . DJD (degenerative joint disease)   . Guaiac + stool 06/28/2017  . Heart murmur   . Hypothyroidism    hyper thyroidism recently  . Ileus ([place]) 2001   post hip replacement  . Metabolic acidosis   . Metabolic bone disease   . Myocardial infarction [facility]) age 28    Assessment: 86 y.o. 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 In February 2021 patient had a strokelike symptom and was found to have an LV thrombus and was initially placed on apixaban and recently switched to rivaroxaban 5mg  BID  (patient reports that the prescribed dose was 10mg  once daily; reported last dose morning of 4/9). Pharmacy has been consulted for IV heparin dosing.   Heparin level 0.26 subtherapeutic on 1300 units/hr Last Hgb 8.2 and platelets 383, no overt bleeding reported. Baseline aPTT, HL, CBC ordered. Will follow both heparin levels and aPTT until levels correlate. 2308 HL = 0.45, at goal, no infusion or bleeding issues reported by RN.   Goal of Therapy:  Heparin level 0.3-0.7 units/ml Monitor platelets by anticoagulation protocol: Yes  Plan:  continue heparin infusion at 1450 units/hr - to stop 4/13 6hr prior to surgery (scheduled at 12:55) Continue to monitor for signs/symptoms of bleeding F/u transition to oral anticoagulant after procedure Tuesday  Faten Frieson, [place]: [phone] 05/11/2019 11:46 PM

## 2019-05-11 NOTE — Progress Notes (Signed)
ANTICOAGULATION CONSULT NOTE - Follow Up Consult  Pharmacy Consult for heparin Indication: LV apical thrombus  Allergies  Allergen Reactions  . Sulfa Antibiotics     unknown    Patient Measurements: Height: 5\' 6"  (167.6 cm) Weight: 73.2 kg (161 lb 6 oz) IBW/kg (Calculated) : 63.8 HEPARIN DW (KG): 73.2  Vital Signs: Temp: 98 F (36.7 C) (04/12 1322) Temp Source: Oral (04/12 1249) BP: 144/54 (04/12 1322) Pulse Rate: 57 (04/12 1322)  Labs: Recent Labs    05/09/19 0432 05/10/19 1533 05/11/19 0035 05/11/19 1200  HGB 8.0* 8.2*  --   --   HCT 25.7* 26.7*  --   --   PLT 433* 383  --   --   APTT  --  32 109*  --   LABPROT 16.8*  --   --   --   INR 1.4*  --   --   --   HEPARINUNFRC  --  <0.10* 0.28* 0.26*  CREATININE 1.67*  --   --  1.53*    Estimated Creatinine Clearance: 30.7 mL/min (A) (by C-G formula based on SCr of 1.53 mg/dL (H)).   Medical History: Past Medical History:  Diagnosis Date  . Anemia   . Arthritis    "all over" all joints  . Bladder cancer (HCC)    bladder  . Blood transfusion 2008  . Chronic kidney disease    bladder ca in 2008;Kid. failure stage 3  . Complication of anesthesia    Ilios post op  . Coronary artery disease   . Diabetes mellitus   . DJD (degenerative joint disease)   . Guaiac + stool 06/28/2017  . Heart murmur   . Hypothyroidism    hyper thyroidism recently  . Ileus ([place]) 2001   post hip replacement  . Metabolic acidosis   . Metabolic bone disease   . Myocardial infarction [facility]) age 71    Assessment: 86 y.o. 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 In February 2021 patient had a strokelike symptom and was found to have an LV thrombus and was initially placed on apixaban and recently switched to rivaroxaban 5mg  BID (patient reports that the prescribed dose was 10mg  once daily; reported last dose morning of 4/9). Pharmacy has been consulted for IV heparin dosing.   Heparin  level 0.26 subtherapeutic on 1300 units/hr Last Hgb 8.2 and platelets 383, no overt bleeding reported. Baseline aPTT, HL, CBC ordered. Will follow both heparin levels and aPTT until levels correlate.  Goal of Therapy:  Heparin level 0.3-0.7 units/ml Monitor platelets by anticoagulation protocol: Yes  Plan:  Increase heparin infusion to 1450 units/hr - to stop 4/13 6hr prior to surgery (scheduled at 12:55) Check heparin level in 8hrs Continue to monitor for signs/symptoms of bleeding F/u transition to oral anticoagulant after procedure Tuesday   Peggyann Juba, PharmD, BCPS Pharmacy: [phone] 05/11/2019 1:44 PM

## 2019-05-11 NOTE — Progress Notes (Signed)
Pharmacy Antibiotic Note  RAWAD [name] is a 86 y.o. male admitted on 05/09/2019 with septic joint infection, started empiric vanc and Zosyn. Transfer from [facility] with CT finding multi-loculated joint effusion concerning for infection - no abx received at OSH. Given patient's history of pseudomonal UTI, will broaden coverage. Pharmacy has been consulted for vancomycin and cefepime dosing.  Now s/p US aspiration of L anterior thigh SQ fluid 4/10 and planning for surgery on Tuesday.  SCr improved 1.53, CrCl ~30 ml/min  Plan: Increase Cefepime 2g IV q12h Increase Vancomycin 1500 mg IV every 48 hours (est AUC 477, SCr 1.53)  Monitor renal fx, cx results, clinical pic, and vanc levels as appropriate  Height: 5\' 6"  (167.6 cm) Weight: 73.2 kg (161 lb 6 oz) IBW/kg (Calculated) : 63.8  Temp (24hrs), Avg:97.9 F (36.6 C), Min:97.6 F (36.4 C), Max:98.4 F (36.9 C)  Recent Labs  Lab 05/09/19 0432 05/10/19 1533 05/11/19 1200  WBC 8.1 6.9  --   CREATININE 1.67*  --  1.53*    Estimated Creatinine Clearance: 30.7 mL/min (A) (by C-G formula based on SCr of 1.53 mg/dL (H)).    Allergies  Allergen Reactions  . Sulfa Antibiotics     unknown    Antimicrobials this admission: Zosyn 4/10 >> 4/11 Vancomycin 4/10 >>  Cefepime 4/11>>  Microbiology results: 4/10 MRSA PCR neg 4/10 BCx: ngtd 4/10 Abscess cx: sent   Peggyann Juba, PharmD, BCPS Pharmacy: [phone] 05/11/2019   1:02 PM

## 2019-05-12 ENCOUNTER — Inpatient Hospital Stay (HOSPITAL_COMMUNITY): Payer: Medicare Other

## 2019-05-12 ENCOUNTER — Inpatient Hospital Stay (HOSPITAL_COMMUNITY): Payer: Medicare Other | Admitting: Anesthesiology

## 2019-05-12 ENCOUNTER — Encounter (HOSPITAL_COMMUNITY)
Admission: AD | Disposition: A | Discharge status: Home Health | Payer: Self-pay | Source: Other Acute Inpatient Hospital | Attending: Internal Medicine

## 2019-05-12 ENCOUNTER — Encounter (HOSPITAL_COMMUNITY): Payer: Self-pay | Admitting: Internal Medicine

## 2019-05-12 DIAGNOSIS — I25810 Atherosclerosis of coronary artery bypass graft(s) without angina pectoris: Secondary | ICD-10-CM | POA: Diagnosis not present

## 2019-05-12 DIAGNOSIS — I513 Intracardiac thrombosis, not elsewhere classified: Secondary | ICD-10-CM | POA: Diagnosis not present

## 2019-05-12 DIAGNOSIS — T8452XA Infection and inflammatory reaction due to internal left hip prosthesis, initial encounter: Secondary | ICD-10-CM | POA: Diagnosis present

## 2019-05-12 DIAGNOSIS — N183 Chronic kidney disease, stage 3 unspecified: Secondary | ICD-10-CM | POA: Diagnosis not present

## 2019-05-12 DIAGNOSIS — M009 Pyogenic arthritis, unspecified: Secondary | ICD-10-CM | POA: Diagnosis not present

## 2019-05-12 HISTORY — DX: Infection and inflammatory reaction due to internal left hip prosthesis, initial encounter: T84.52XA

## 2019-05-12 HISTORY — PX: TOTAL HIP ARTHROPLASTY: SHX124

## 2019-05-12 LAB — COMPREHENSIVE METABOLIC PANEL
ALT: 15 U/L (ref 0–44)
AST: 24 U/L (ref 15–41)
Albumin: 2.1 g/dL — ABNORMAL LOW (ref 3.5–5.0)
Alkaline Phosphatase: 29 U/L — ABNORMAL LOW (ref 38–126)
Anion gap: 8 (ref 5–15)
BUN: 31 mg/dL — ABNORMAL HIGH (ref 8–23)
CO2: 21 mmol/L — ABNORMAL LOW (ref 22–32)
Calcium: 9.3 mg/dL (ref 8.9–10.3)
Chloride: 102 mmol/L (ref 98–111)
Creatinine, Ser: 1.25 mg/dL — ABNORMAL HIGH (ref 0.61–1.24)
GFR calc Af Amer: 60 mL/min — ABNORMAL LOW (ref >60)
GFR calc non Af Amer: 51 mL/min — ABNORMAL LOW (ref >60)
Glucose, Bld: 212 mg/dL — ABNORMAL HIGH (ref 70–99)
Potassium: 4.5 mmol/L (ref 3.5–5.1)
Sodium: 131 mmol/L — ABNORMAL LOW (ref 135–145)
Total Bilirubin: 0.6 mg/dL (ref 0.3–1.2)
Total Protein: 5.1 g/dL — ABNORMAL LOW (ref 6.5–8.1)

## 2019-05-12 LAB — CBC
HCT: 32.4 % — ABNORMAL LOW (ref 39.0–52.0)
Hemoglobin: 10.1 g/dL — ABNORMAL LOW (ref 13.0–17.0)
MCH: 26.6 pg (ref 26.0–34.0)
MCHC: 31.2 g/dL (ref 30.0–36.0)
MCV: 85.3 fL (ref 80.0–100.0)
Platelets: 307 10*3/uL (ref 150–400)
RBC: 3.8 MIL/uL — ABNORMAL LOW (ref 4.22–5.81)
RDW: 15.2 % (ref 11.5–15.5)
WBC: 6.3 10*3/uL (ref 4.0–10.5)
nRBC: 0 % (ref 0.0–0.2)

## 2019-05-12 LAB — SURGICAL PCR SCREEN
MRSA, PCR: NEGATIVE
Staphylococcus aureus: NEGATIVE

## 2019-05-12 LAB — GLUCOSE, CAPILLARY
Glucose-Capillary: 115 mg/dL — ABNORMAL HIGH (ref 70–99)
Glucose-Capillary: 119 mg/dL — ABNORMAL HIGH (ref 70–99)
Glucose-Capillary: 195 mg/dL — ABNORMAL HIGH (ref 70–99)
Glucose-Capillary: 294 mg/dL — ABNORMAL HIGH (ref 70–99)
Glucose-Capillary: 293 mg/dL — ABNORMAL HIGH (ref 70–99)

## 2019-05-12 LAB — PREPARE RBC (CROSSMATCH)

## 2019-05-12 LAB — MAGNESIUM: Magnesium: 1.6 mg/dL — ABNORMAL LOW (ref 1.7–2.4)

## 2019-05-12 LAB — SARS CORONAVIRUS 2 (TAT 6-24 HRS): SARS Coronavirus 2: NEGATIVE

## 2019-05-12 LAB — APTT: aPTT: 159 seconds — ABNORMAL HIGH (ref 24–36)

## 2019-05-12 LAB — PHOSPHORUS: Phosphorus: 3 mg/dL (ref 2.5–4.6)

## 2019-05-12 LAB — HEPARIN LEVEL (UNFRACTIONATED): Heparin Unfractionated: 0.43 IU/mL (ref 0.30–0.70)

## 2019-05-12 SURGERY — ARTHROPLASTY, HIP, TOTAL, ANTERIOR APPROACH
Anesthesia: General | Site: Hip | Laterality: Left

## 2019-05-12 MED ORDER — SODIUM CHLORIDE (PF) 0.9 % IJ SOLN
INTRAMUSCULAR | Status: AC
  Filled 2019-05-12: qty 50

## 2019-05-12 MED ORDER — ROCURONIUM BROMIDE 10 MG/ML (PF) SYRINGE
PREFILLED_SYRINGE | INTRAVENOUS | Status: AC
  Filled 2019-05-12: qty 10

## 2019-05-12 MED ORDER — HYDROCODONE-ACETAMINOPHEN 5-325 MG PO TABS: 1.0000 | ORAL_TABLET | Freq: Four times a day (QID) | ORAL | 0 refills | Status: DC | PRN

## 2019-05-12 MED ORDER — METHOCARBAMOL 500 MG IVPB - SIMPLE MED
INTRAVENOUS | Status: AC
  Filled 2019-05-12: qty 50

## 2019-05-12 MED ORDER — FENTANYL CITRATE (PF) 100 MCG/2ML IJ SOLN
INTRAMUSCULAR | Status: DC | PRN
  Administered 2019-05-12: 25 ug via INTRAVENOUS
  Administered 2019-05-12: 50 ug via INTRAVENOUS
  Administered 2019-05-12: 25 ug via INTRAVENOUS
  Administered 2019-05-12: 50 ug via INTRAVENOUS

## 2019-05-12 MED ORDER — ONDANSETRON HCL 4 MG PO TABS: 4.0000 mg | ORAL_TABLET | Freq: Four times a day (QID) | ORAL | Status: DC | PRN

## 2019-05-12 MED ORDER — PROMETHAZINE HCL 25 MG/ML IJ SOLN: 6.2500 mg | INTRAMUSCULAR | Status: DC | PRN

## 2019-05-12 MED ORDER — POLYETHYLENE GLYCOL 3350 17 G PO PACK: 17.0000 g | PACK | Freq: Every day | ORAL | Status: DC | PRN

## 2019-05-12 MED ORDER — ONDANSETRON HCL 4 MG/2ML IJ SOLN: 4.0000 mg | Freq: Four times a day (QID) | INTRAMUSCULAR | Status: DC | PRN

## 2019-05-12 MED ORDER — 0.9 % SODIUM CHLORIDE (POUR BTL) OPTIME
TOPICAL | Status: DC | PRN
  Administered 2019-05-12: 1000 mL

## 2019-05-12 MED ORDER — DEXAMETHASONE SODIUM PHOSPHATE 10 MG/ML IJ SOLN
INTRAMUSCULAR | Status: DC | PRN
  Administered 2019-05-12: 6 mg via INTRAVENOUS

## 2019-05-12 MED ORDER — METHOCARBAMOL 500 MG IVPB - SIMPLE MED
500.0000 mg | Freq: Four times a day (QID) | INTRAVENOUS | Status: DC | PRN
  Administered 2019-05-12: 500 mg via INTRAVENOUS
  Filled 2019-05-12: qty 50

## 2019-05-12 MED ORDER — MORPHINE SULFATE (PF) 2 MG/ML IV SOLN
0.5000 mg | INTRAVENOUS | Status: DC | PRN
  Administered 2019-05-12: 1 mg via INTRAVENOUS
  Filled 2019-05-12: qty 1

## 2019-05-12 MED ORDER — HYDROCODONE-ACETAMINOPHEN 5-325 MG PO TABS
1.0000 | ORAL_TABLET | ORAL | Status: DC | PRN
  Administered 2019-05-12 – 2019-05-14 (×10): 1 via ORAL
  Filled 2019-05-12 (×10): qty 1

## 2019-05-12 MED ORDER — FENTANYL CITRATE (PF) 100 MCG/2ML IJ SOLN
INTRAMUSCULAR | Status: AC
  Filled 2019-05-12: qty 2

## 2019-05-12 MED ORDER — CEFAZOLIN SODIUM-DEXTROSE 2-4 GM/100ML-% IV SOLN
2.0000 g | INTRAVENOUS | Status: AC
  Administered 2019-05-12: 10:00:00 2 g via INTRAVENOUS
  Filled 2019-05-12: qty 100

## 2019-05-12 MED ORDER — WATER FOR IRRIGATION, STERILE IR SOLN
Status: DC | PRN
  Administered 2019-05-12: 1000 mL

## 2019-05-12 MED ORDER — ACETAMINOPHEN 325 MG PO TABS: 325.0000 mg | ORAL_TABLET | Freq: Four times a day (QID) | ORAL | Status: DC | PRN

## 2019-05-12 MED ORDER — SORBITOL 70 % SOLN
30.0000 mL | Freq: Every day | Status: DC | PRN
  Filled 2019-05-12: qty 30

## 2019-05-12 MED ORDER — DIPHENHYDRAMINE HCL 12.5 MG/5ML PO ELIX: 12.5000 mg | ORAL_SOLUTION | ORAL | Status: DC | PRN

## 2019-05-12 MED ORDER — ACETAMINOPHEN 500 MG PO TABS
1000.0000 mg | ORAL_TABLET | Freq: Once | ORAL | Status: AC
  Administered 2019-05-12: 1000 mg via ORAL
  Filled 2019-05-12: qty 2

## 2019-05-12 MED ORDER — PHENYLEPHRINE 40 MCG/ML (10ML) SYRINGE FOR IV PUSH (FOR BLOOD PRESSURE SUPPORT)
PREFILLED_SYRINGE | INTRAVENOUS | Status: AC
  Filled 2019-05-12: qty 10

## 2019-05-12 MED ORDER — IRRISEPT - 450ML BOTTLE WITH 0.05% CHG IN STERILE WATER, USP 99.95% OPTIME
TOPICAL | Status: DC | PRN
  Administered 2019-05-12: 450 mL

## 2019-05-12 MED ORDER — LACTATED RINGERS IV SOLN: INTRAVENOUS | Status: DC

## 2019-05-12 MED ORDER — METHOCARBAMOL 500 MG PO TABS
500.0000 mg | ORAL_TABLET | Freq: Four times a day (QID) | ORAL | Status: DC | PRN
  Administered 2019-05-12 – 2019-05-14 (×2): 500 mg via ORAL
  Filled 2019-05-12 (×2): qty 1

## 2019-05-12 MED ORDER — ROCURONIUM BROMIDE 10 MG/ML (PF) SYRINGE
PREFILLED_SYRINGE | INTRAVENOUS | Status: DC | PRN
  Administered 2019-05-12: 40 mg via INTRAVENOUS

## 2019-05-12 MED ORDER — ONDANSETRON HCL 4 MG/2ML IJ SOLN
INTRAMUSCULAR | Status: DC | PRN
  Administered 2019-05-12: 4 mg via INTRAVENOUS

## 2019-05-12 MED ORDER — CEFAZOLIN SODIUM-DEXTROSE 2-4 GM/100ML-% IV SOLN
2.0000 g | Freq: Three times a day (TID) | INTRAVENOUS | Status: DC
  Administered 2019-05-12 – 2019-05-13 (×2): 2 g via INTRAVENOUS
  Filled 2019-05-12 (×2): qty 100

## 2019-05-12 MED ORDER — LIDOCAINE 2% (20 MG/ML) 5 ML SYRINGE
INTRAMUSCULAR | Status: DC | PRN
  Administered 2019-05-12: 80 mg via INTRAVENOUS

## 2019-05-12 MED ORDER — PHENOL 1.4 % MT LIQD: 1.0000 | OROMUCOSAL | Status: DC | PRN

## 2019-05-12 MED ORDER — DOCUSATE SODIUM 100 MG PO CAPS
100.0000 mg | ORAL_CAPSULE | Freq: Two times a day (BID) | ORAL | Status: DC
  Administered 2019-05-13 – 2019-05-14 (×3): 100 mg via ORAL
  Filled 2019-05-12 (×3): qty 1

## 2019-05-12 MED ORDER — TRANEXAMIC ACID-NACL 1000-0.7 MG/100ML-% IV SOLN
1000.0000 mg | INTRAVENOUS | Status: AC
  Administered 2019-05-12: 11:00:00 1000 mg via INTRAVENOUS
  Filled 2019-05-12: qty 100

## 2019-05-12 MED ORDER — FENTANYL CITRATE (PF) 100 MCG/2ML IJ SOLN
25.0000 ug | INTRAMUSCULAR | Status: DC | PRN
  Administered 2019-05-12 (×3): 50 ug via INTRAVENOUS

## 2019-05-12 MED ORDER — BACLOFEN 10 MG PO TABS: 10.0000 mg | ORAL_TABLET | Freq: Two times a day (BID) | ORAL | 0 refills | Status: AC

## 2019-05-12 MED ORDER — PROPOFOL 10 MG/ML IV BOLUS
INTRAVENOUS | Status: DC | PRN
  Administered 2019-05-12: 90 mg via INTRAVENOUS

## 2019-05-12 MED ORDER — SUGAMMADEX SODIUM 200 MG/2ML IV SOLN
INTRAVENOUS | Status: DC | PRN
  Administered 2019-05-12: 200 mg via INTRAVENOUS

## 2019-05-12 MED ORDER — SODIUM CHLORIDE 0.9% IV SOLUTION: Freq: Once | INTRAVENOUS | Status: DC

## 2019-05-12 MED ORDER — ACETAMINOPHEN 500 MG PO TABS
500.0000 mg | ORAL_TABLET | Freq: Four times a day (QID) | ORAL | Status: DC
  Administered 2019-05-12 – 2019-05-13 (×3): 500 mg via ORAL
  Filled 2019-05-12 (×3): qty 1

## 2019-05-12 MED ORDER — ESMOLOL HCL 100 MG/10ML IV SOLN
INTRAVENOUS | Status: DC | PRN
  Administered 2019-05-12: 10 mg via INTRAVENOUS
  Administered 2019-05-12 (×6): 5 mg via INTRAVENOUS

## 2019-05-12 MED ORDER — METOCLOPRAMIDE HCL 5 MG PO TABS: 5.0000 mg | ORAL_TABLET | Freq: Three times a day (TID) | ORAL | Status: DC | PRN

## 2019-05-12 MED ORDER — ESMOLOL HCL 100 MG/10ML IV SOLN
INTRAVENOUS | Status: AC
  Filled 2019-05-12: qty 10

## 2019-05-12 MED ORDER — METOCLOPRAMIDE HCL 5 MG/ML IJ SOLN: 5.0000 mg | Freq: Three times a day (TID) | INTRAMUSCULAR | Status: DC | PRN

## 2019-05-12 MED ORDER — PHENYLEPHRINE 40 MCG/ML (10ML) SYRINGE FOR IV PUSH (FOR BLOOD PRESSURE SUPPORT)
PREFILLED_SYRINGE | INTRAVENOUS | Status: DC | PRN
  Administered 2019-05-12 (×3): 80 ug via INTRAVENOUS

## 2019-05-12 MED ORDER — MENTHOL 3 MG MT LOZG: 1.0000 | LOZENGE | OROMUCOSAL | Status: DC | PRN

## 2019-05-12 MED ORDER — MAGNESIUM CITRATE PO SOLN: 1.0000 | Freq: Once | ORAL | Status: DC | PRN

## 2019-05-12 MED ORDER — RIVAROXABAN 10 MG PO TABS
10.0000 mg | ORAL_TABLET | Freq: Every day | ORAL | Status: DC
  Administered 2019-05-13 – 2019-05-14 (×2): 10 mg via ORAL
  Filled 2019-05-12 (×3): qty 1

## 2019-05-12 SURGICAL SUPPLY — 42 items
BAG ZIPLOCK 12X15 (MISCELLANEOUS) IMPLANT
BLADE SAG 18X100X1.27 (BLADE) ×3 IMPLANT
BLADE SURG SZ10 CARB STEEL (BLADE) ×6 IMPLANT
CHLORAPREP W/TINT 26 (MISCELLANEOUS) ×3 IMPLANT
CLOSURE STERI-STRIP 1/2X4 (GAUZE/BANDAGES/DRESSINGS) ×1
CLSR STERI-STRIP ANTIMIC 1/2X4 (GAUZE/BANDAGES/DRESSINGS) ×2 IMPLANT
COVER PERINEAL POST (MISCELLANEOUS) ×3 IMPLANT
COVER SURGICAL LIGHT HANDLE (MISCELLANEOUS) ×3 IMPLANT
COVER WAND RF STERILE (DRAPES) IMPLANT
DECANTER SPIKE VIAL GLASS SM (MISCELLANEOUS) ×6 IMPLANT
DRAPE IMP U-DRAPE 54X76 (DRAPES) ×3 IMPLANT
DRAPE STERI IOBAN 125X83 (DRAPES) ×3 IMPLANT
DRAPE U-SHAPE 47X51 STRL (DRAPES) ×6 IMPLANT
DRSG MEPILEX BORDER 4X8 (GAUZE/BANDAGES/DRESSINGS) ×3 IMPLANT
ELECT BLADE TIP CTD 4 INCH (ELECTRODE) ×3 IMPLANT
ELECT REM PT RETURN 15FT ADLT (MISCELLANEOUS) ×3 IMPLANT
GLOVE BIO SURGEON STRL SZ7.5 (GLOVE) ×6 IMPLANT
GLOVE BIOGEL PI IND STRL 8 (GLOVE) ×2 IMPLANT
GLOVE BIOGEL PI INDICATOR 8 (GLOVE) ×4
GOWN STRL REUS W/TWL LRG LVL3 (GOWN DISPOSABLE) ×3 IMPLANT
GOWN STRL REUS W/TWL XL LVL3 (GOWN DISPOSABLE) ×3 IMPLANT
HEAD BIOLOX HIP 36/-2.5 (Joint) IMPLANT
HIP BIOLOX HD 36/-2.5 (Joint) ×3 IMPLANT
HOLDER FOLEY CATH W/STRAP (MISCELLANEOUS) IMPLANT
INSERT TRIDENT POLY 36MM 0DEG (Insert) ×2 IMPLANT
KIT TURNOVER KIT A (KITS) IMPLANT
MANIFOLD NEPTUNE II (INSTRUMENTS) ×3 IMPLANT
NS IRRIG 1000ML POUR BTL (IV SOLUTION) ×5 IMPLANT
PACK ANTERIOR HIP CUSTOM (KITS) ×3 IMPLANT
PENCIL SMOKE EVACUATOR (MISCELLANEOUS) IMPLANT
PROTECTOR NERVE ULNAR (MISCELLANEOUS) ×3 IMPLANT
SUT MNCRL AB 4-0 PS2 18 (SUTURE) ×3 IMPLANT
SUT MON AB 2-0 CT1 36 (SUTURE) ×4 IMPLANT
SUT STRATAFIX 0 PDS 27 VIOLET (SUTURE) ×3
SUT VIC AB 0 CT1 36 (SUTURE) ×3 IMPLANT
SUT VIC AB 1 CT1 36 (SUTURE) ×3 IMPLANT
SUT VIC AB 2-0 CT1 27 (SUTURE) ×6
SUT VIC AB 2-0 CT1 TAPERPNT 27 (SUTURE) ×2 IMPLANT
SUTURE STRATFX 0 PDS 27 VIOLET (SUTURE) ×1 IMPLANT
TRAY FOLEY MTR SLVR 16FR STAT (SET/KITS/TRAYS/PACK) IMPLANT
WATER STERILE IRR 1000ML POUR (IV SOLUTION) ×6 IMPLANT
YANKAUER SUCT BULB TIP 10FT TU (MISCELLANEOUS) ×5 IMPLANT

## 2019-05-12 NOTE — Transfer of Care (Signed)
Immediate Anesthesia Transfer of Care Note  Patient: Casey [name]  Procedure(s) Performed: Procedure(s) with comments: irrigation and debridement septic hip headliner exchange (Left) - wants hana table  Patient Location: PACU  Anesthesia Type:General  Level of Consciousness:  sedated, patient cooperative and responds to stimulation  Airway & Oxygen Therapy:Patient Spontanous Breathing and Patient connected to face mask oxgen  Post-op Assessment:  Report given to PACU RN and Post -op Vital signs reviewed and stable  Post vital signs:  Reviewed and stable  Last Vitals:  Vitals:   05/12/19 0740 05/12/19 0913  BP: (!) 145/61 (!) 154/62  Pulse: (!) 129 70  Resp: 16 16  Temp: 36.4 C 36.7 C  SpO2: 0000000 [id number]    Complications: No apparent anesthesia complications

## 2019-05-12 NOTE — Interval H&P Note (Signed)
I participated in the care of this patient and agree with the above history, physical and evaluation. I performed a review of the history and a physical exam as detailed   Cuinn Westerhold Daniel Massey Ruhland MD  

## 2019-05-12 NOTE — Progress Notes (Signed)
ANTICOAGULATION CONSULT NOTE - Follow Up Consult  Pharmacy Consult for heparin > xarelto to resume 4/14 Indication: LV apical thrombus  Allergies  Allergen Reactions  . Sulfa Antibiotics     unknown    Patient Measurements: Height: 5\' 6"  (167.6 cm) Weight: 72.6 kg (160 lb) IBW/kg (Calculated) : 63.8 HEPARIN DW (KG): 72.6  Vital Signs: Temp: 98.1 F (36.7 C) (04/13 1215) Temp Source: Oral (04/13 0913) BP: 150/64 (04/13 1230) Pulse Rate: 58 (04/13 1230)  Labs: Recent Labs    05/10/19 1533 05/10/19 1533 05/11/19 0035 05/11/19 0035 05/11/19 1200 05/11/19 2247 05/12/19 0417  HGB 8.2*  --   --   --   --   --  10.1*  HCT 26.7*  --   --   --   --   --  32.4*  PLT 383  --   --   --   --   --  307  APTT 32  --  109*  --   --   --  159*  HEPARINUNFRC <0.10*   < > 0.28*   < > 0.26* 0.45 0.43  CREATININE  --   --   --   --  1.53*  --   --    < > = values in this interval not displayed.    Estimated Creatinine Clearance: 30.7 mL/min (A) (by C-G formula based on SCr of 1.53 mg/dL (H)).   Medical History: Past Medical History:  Diagnosis Date  . Anemia   . Arthritis    "all over" all joints  . Bladder cancer (HCC)    bladder  . Blood transfusion 2008  . Chronic kidney disease    bladder ca in 2008;Kid. failure stage 3  . Complication of anesthesia    Ilios post op  . Coronary artery disease   . Diabetes mellitus   . DJD (degenerative joint disease)   . Guaiac + stool 06/28/2017  . Heart murmur   . Hypothyroidism    hyper thyroidism recently  . Ileus ([place]) 2001   post hip replacement  . Metabolic acidosis   . Metabolic bone disease   . Myocardial infarction [facility]) age 86    Assessment: 86 y.o. 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 In February 2021 patient had a strokelike symptom and was found to have an LV thrombus and was initially placed on apixaban and recently switched to rivaroxaban 5mg  BID (patient  reports that the prescribed dose was 10mg  once daily; reported last dose morning of 4/9). Pharmacy has been consulted for IV heparin dosing.    05/12/19 Heparin stopped at 04:00 for I&D today at 10:00. Post op orders received to resume home Xarelto in AM 05/13/19.  Hgb improved 10.1 after transfusion 4/12  SCr improved to 1.53 yesterday, CrCl ~30 ml/min  No bleeding currently reported   Goal of Therapy:  Monitor platelets by anticoagulation protocol: Yes  Plan:  On 4/14 at 10:00, resume Xarelto 10mg  daily as taking PTA Monitor renal function and CBC Continue to monitor for signs/symptoms of bleeding  Peggyann Juba, PharmD, BCPS Pharmacy: [phone] 05/12/2019 12:55 PM

## 2019-05-12 NOTE — Progress Notes (Signed)
Report given nurse regarding Foley cathter if pt needs during surgery today.. Pt's family requested do not use regular Foley, it has to be flexible which can go around prostate.

## 2019-05-12 NOTE — Plan of Care (Signed)
  Problem: Health Behavior/Discharge Planning: Goal: Ability to manage health-related needs will improve Outcome: Progressing   Problem: Clinical Measurements: Goal: Ability to maintain clinical measurements within normal limits will improve Outcome: Progressing Goal: Will remain free from infection Outcome: Progressing   

## 2019-05-12 NOTE — Progress Notes (Signed)
PROGRESS NOTE  Casey [name] [id number] DOB: 1929/10/13 DOA: 05/09/2019 PCP: Glenda Chroman, MD   LOS: 3 days   Brief narrative: As per HPI,  Casey [name] is an 86 y.o. male with history of diabetes mellitus, hypothyroidism, hypertension, chronic kidney disease stage III, anemia with history of  hip surgery with replacement in November 2020.  Following which in February 2021, patient has had a strokelike symptom and was found to have an LV thrombus and was placed on xarelto.  Patient states since then patient has been having some hip pain.  Over the last few weeks, the hip pain has been worse and he had gone to visit his primary care. Patient started developing fever and chills over the last 5 days patient has been taking antibiotics.  Patient's son came to visit him and found that patient was not doing well and brought to the ER at [facility]   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 Patient has been seen by orthopedics.  Status post aspiration of the joint with findings of septic arthritis.  Continue IV antibiotics on vancomycin and cefepime.  Status post PICC line.  Plan for irrigation and debridement on 05/12/2019 by Dr. Percell Miller.    Recently diagnosed LV apical thrombus on anticoagulation (xarelto)as outpatient, was diagnosed in February 2021.   Will need to resume anticoagulation after surgical intervention.  Continue metoprolol  Chronic kidney disease stage IIIb  We will closely monitor BMP.  Creatinine of 1.5.  Will closely monitor.   Chronic anemia likely from renal disease Monitor CBC closely transfuse as necessary.  On tamsulosin.  Hemoglobin of 10.1 today.  Status post 2 units of packed RBC transfusion.     Hypothyroidism  Continue Synthroid.   Mild hyponatremia Monitor BMP.   Diabetes mellitus type 2 Continue sliding scale insulin, Accu-Cheks diabetic diet.  POC glucose of 119.   Nonobstructive CAD  No chest pain at this time.  Change metoprolol.  Resume anticoagulant after surgery.    VTE Prophylaxis: Heparin drip  Code Status: Full code  Family Communication: None  Disposition Plan:  . Patient is from home . Likely disposition to home with home health . Barriers to discharge: Pending surgical intervention, IV antibiotics, PICC line placement   Consultants:  Orthopedics  Infectious disease  Procedures:  Joint aspiration left hip  PRBC transfusion  Antibiotics:  . Vancomycin and cefepime  Anti-infectives (From admission, onward)   Start     Dose/Rate Route Frequency Ordered Stop   05/12/19 0815  ceFAZolin (ANCEF) IVPB 2g/100 mL premix     2 g 200 mL/hr over 30 Minutes Intravenous On call to O.R. 05/12/19 0803 05/12/19 1021   05/11/19 2200  vancomycin (VANCOREADY) IVPB 1250 mg/250 mL  Status:  Discontinued     1,250 mg 166.7 mL/hr over 90 Minutes Intravenous Every 48 hours 05/09/19 2152 05/11/19 1259   05/11/19 2200  [MAR Hold]  vancomycin (VANCOREADY) IVPB 1500 mg/300 mL     (MAR Hold since Tue 05/12/2019 at 0913.Hold Reason: Transfer to a Procedural area.)   1,500 mg 150 mL/hr over 120 Minutes Intravenous Every 48 hours 05/11/19 1259     05/11/19 1400  [MAR Hold]  ceFEPIme (MAXIPIME) 2 g  in sodium chloride 0.9 % 100 mL IVPB     (MAR Hold since Tue 05/12/2019 at 0913.Hold Reason: Transfer to a Procedural area.)   2 g 200 mL/hr over 30 Minutes Intravenous Every 12 hours 05/11/19 1301     05/10/19 1400  ceFEPIme (MAXIPIME) 2 g in sodium chloride 0.9 % 100 mL IVPB  Status:  Discontinued     2 g 200 mL/hr over 30 Minutes Intravenous Every 24 hours 05/10/19 1339 05/11/19 1301   05/09/19 1800  piperacillin-tazobactam (ZOSYN) IVPB 3.375 g  Status:  Discontinued      3.375 g 12.5 mL/hr over 240 Minutes Intravenous Every 8 hours 05/09/19 1754 05/10/19 1318   05/09/19 1745  vancomycin (VANCOREADY) IVPB 1500 mg/300 mL     1,500 mg 150 mL/hr over 120 Minutes Intravenous  Once 05/09/19 1754 05/10/19 0135      Subjective: Today, patient was seen and examined at bedside.  Complains of mild hip pain on certain movements.  Denies any shortness of breath cough fever chills or rigor.  Objective: Vitals:   05/12/19 0740 05/12/19 0913  BP: (!) 145/61 (!) 154/62  Pulse: (!) 129 70  Resp: 16 16  Temp: 97.6 F (36.4 C) 98.1 F (36.7 C)  SpO2: 96% 99%    Intake/Output Summary (Last 24 hours) at 05/12/2019 1044 Last data filed at 05/12/2019 0402 Gross per 24 hour  Intake 1382.46 ml  Output --  Net 1382.46 ml   Filed Weights   05/09/19 0310 05/12/19 0922  Weight: 73.2 kg 72.6 kg   Body mass index is 25.82 kg/m.   Physical Exam: GENERAL: Patient is alert awake and oriented. Not in obvious distress. HENT: No scleral pallor or icterus. Pupils equally reactive to light. Oral mucosa is moist NECK: is supple, no gross swelling noted. CHEST: Clear to auscultation. No crackles or wheezes.  Diminished breath sounds bilaterally. CVS: S1 and S2 heard, no murmur. Regular rate and rhythm.  ABDOMEN: Soft, non-tender, bowel sounds are present. EXTREMITIES: No edema.  Left hip with incision scar, tenderness on palpation.  Bandage at the site of aspiration. CNS: Cranial nerves are intact. No focal motor deficits. SKIN: warm and dry without rashes.  Data Review: I have personally reviewed the following laboratory data and studies,  CBC: Recent Labs  Lab 05/09/19 0432 05/10/19 1533 05/12/19 0417  WBC 8.1 6.9 6.3  NEUTROABS 5.6  --   --   HGB 8.0* 8.2* 10.1*  HCT 25.7* 26.7* 32.4*  MCV 84.3 84.8 85.3  PLT 433* 383 [id number]   Basic Metabolic Panel: Recent Labs  Lab 05/09/19 0432 05/11/19 1200  NA 133* 134*  K 3.9 4.4  CL 102 102  CO2 22 24  GLUCOSE 52*  133*  BUN 39* 36*  CREATININE 1.67* 1.53*  CALCIUM 10.5* 10.2   Liver Function Tests: Recent Labs  Lab 05/09/19 0432  AST 20  ALT 16  ALKPHOS 28*  BILITOT 0.3  PROT 5.1*  ALBUMIN 2.0*   No results for input(s): LIPASE, AMYLASE in the last 168 hours. No results for input(s): AMMONIA in the last 168 hours. Cardiac Enzymes: No results for input(s): CKTOTAL, CKMB, CKMBINDEX, TROPONINI in the last 168 hours. BNP (last 3 results) No results for input(s): BNP in the last 8760 hours.  ProBNP (last 3 results) No results for input(s): PROBNP in the last 8760 hours.  CBG: Recent Labs  Lab 05/11/19 1210 05/11/19 1717 05/11/19 2110 05/12/19 0732 05/12/19 0918  GLUCAP 114* 147* 181* 115* 119*  Recent Results (from the past 240 hour(s))  Surgical pcr screen     Status: None   Collection Time: 05/09/19  3:21 AM   Specimen: Nasal Mucosa; Nasal Swab  Result Value Ref Range Status   MRSA, PCR NEGATIVE NEGATIVE Final   Staphylococcus aureus NEGATIVE NEGATIVE Final    Comment: (NOTE) The Xpert SA Assay (FDA approved for NASAL specimens in patients 74 years of age and older), is one component of a comprehensive surveillance program. It is not intended to diagnose infection nor to guide or monitor treatment. Performed at [facility], [place] [address], [place], [place] [address]   Culture, blood (routine x 2)     Status: None (Preliminary result)   Collection Time: 05/09/19  4:33 AM   Specimen: BLOOD  Result Value Ref Range Status   Specimen Description BLOOD LEFT FOREARM  Final   Special Requests   Final    BOTTLES DRAWN AEROBIC AND ANAEROBIC Blood Culture adequate volume   Culture   Final    NO GROWTH 3 DAYS Performed at [facility], [place] [address] [place], [place] [address]    Report Status PENDING  Incomplete  Culture, blood (routine x 2)     Status: None (Preliminary result)   Collection Time: 05/09/19  4:43 AM   Specimen: BLOOD  Result Value Ref Range  Status   Specimen Description BLOOD LEFT HAND  Final   Special Requests   Final    BOTTLES DRAWN AEROBIC ONLY Blood Culture adequate volume   Culture   Final    NO GROWTH 3 DAYS Performed at [facility], [place] [address], [place], [place] [address]    Report Status PENDING  Incomplete  Aerobic/Anaerobic Culture (surgical/deep wound)     Status: None (Preliminary result)   Collection Time: 05/09/19  5:06 PM   Specimen: Wound; Abscess  Result Value Ref Range Status   Specimen Description WOUND LEFT THIGH US ASPIRATION  Final   Special Requests NONE  Final   Gram Stain   Final    ABUNDANT WBC PRESENT, PREDOMINANTLY PMN NO ORGANISMS SEEN    Culture   Final    RARE STREPTOCOCCUS AGALACTIAE TESTING AGAINST S. AGALACTIAE NOT ROUTINELY PERFORMED DUE TO PREDICTABILITY OF AMP/PEN/VAN SUSCEPTIBILITY. Performed at [facility], [place] [address], [place], [place] [address]    Report Status PENDING  Incomplete  SARS CORONAVIRUS 2 (TAT 6-24 HRS) Nasopharyngeal Nasopharyngeal Swab     Status: None   Collection Time: 05/11/19  2:48 PM   Specimen: Nasopharyngeal Swab  Result Value Ref Range Status   SARS Coronavirus 2 NEGATIVE NEGATIVE Final    Comment: (NOTE) SARS-CoV-2 target nucleic acids are NOT DETECTED. The SARS-CoV-2 RNA is generally detectable in upper and lower respiratory specimens during the acute phase of infection. Negative results do not preclude SARS-CoV-2 infection, do not rule out co-infections with other pathogens, and should not be used as the sole basis for treatment or other patient management decisions. Negative results must be combined with clinical observations, patient history, and epidemiological information. The expected result is Negative. Fact Sheet for Patients: SugarRoll.be Fact Sheet for Healthcare Providers: https://www.woods-mathews.com/ This test is not yet approved or cleared by the [place] FDA and    has been authorized for detection and/or diagnosis of SARS-CoV-2 by FDA under an Emergency Use Authorization (EUA). This EUA will remain  in effect (meaning this test can be used) for the duration of the COVID-19 declaration under  Section 56 4(b)(1) of the Act, 21 U.S.C. section 360bbb-3(b)(1), unless the authorization is terminated or revoked sooner. Performed at [facility], [place] [address], [place], [place] [address]   Surgical pcr screen     Status: None   Collection Time: 05/12/19  8:14 AM   Specimen: Nasal Mucosa; Nasal Swab  Result Value Ref Range Status   MRSA, PCR NEGATIVE NEGATIVE Final   Staphylococcus aureus NEGATIVE NEGATIVE Final    Comment: (NOTE) The Xpert SA Assay (FDA approved for NASAL specimens in patients 67 years of age and older), is one component of a comprehensive surveillance program. It is not intended to diagnose infection nor to guide or monitor treatment. Performed at [facility], [place] [address], [place], [place] [address]      Studies: [place] EKG SITE RITE  Result Date: 05/11/2019 If Site Rite image not attached, placement could not be confirmed due to current cardiac rhythm.     Flora Lipps, MD  [facility] 05/12/2019

## 2019-05-12 NOTE — Progress Notes (Signed)
[facility] for Infectious Disease    Date of Admission:  05/09/2019   Total days of antibiotics 4        Day 1 cefazolin           ID: Casey [name] is a 86 y.o. male with LV thrombus, and concern for L THR , and large deep thigh abscess Principal Problem:   Left hip prosthetic joint infection (HCC) Active Problems:   CAD (coronary artery disease) of artery bypass graft   Septic arthritis (HCC)   LV (left ventricular) mural thrombus   CKD (chronic kidney disease), stage III   Anemia   Pressure injury of skin    Subjective: Afebrile, had picc line placed yesterday without issue  Medications:  . sodium chloride   Intravenous Once  . sodium chloride   Intravenous Once  . acetaminophen  500 mg Oral Q6H  . Chlorhexidine Gluconate Cloth  6 each Topical Daily  . docusate sodium  100 mg Oral BID  . fentaNYL      . fentaNYL      . finasteride  5 mg Oral Daily  . insulin aspart  0-5 Units Subcutaneous QHS  . insulin aspart  0-9 Units Subcutaneous TID WC  . levothyroxine  25 mcg Oral QAC breakfast  . metoprolol succinate  25 mg Oral Daily  . [START ON 05/13/2019] rivaroxaban  10 mg Oral Daily  . tamsulosin  0.4 mg Oral Daily    Objective: Vital signs in last 24 hours: Temp:  [97.4 F (36.3 C)-98.3 F (36.8 C)] 97.7 F (36.5 C) (04/13 1346) Pulse Rate:  [53-129] 83 (04/13 1346) Resp:  [12-17] 16 (04/13 1346) BP: (112-154)/(52-100) 121/63 (04/13 1346) SpO2:  [96 %-100 %] 96 % (04/13 1346) Weight:  [72.6 kg] 72.6 kg (04/13 0922) Physical Exam  Constitutional: He is oriented to person, place, and time. He appears well-developed and well-nourished. No distress.  HENT:  Mouth/Throat: Oropharynx is clear and moist. No oropharyngeal exudate.  Cardiovascular: Normal rate, regular rhythm and normal heart sounds. Exam reveals no gallop and no friction rub.  No murmur heard.  Pulmonary/Chest: Effort normal and breath sounds normal. No respiratory distress. He has no  wheezes.  [id number] arm picc line Lymphadenopathy:  He has no cervical adenopathy.  Neurological: He is alert and oriented to person, place, and time.  Skin: Skin is warm and dry. No rash noted. No erythema.  Psychiatric: He has a normal mood and affect. His behavior is normal.     Lab Results Recent Labs    05/10/19 1533 05/11/19 1200 05/12/19 0417 05/12/19 1244  WBC 6.9  --  6.3  --   HGB 8.2*  --  10.1*  --   HCT 26.7*  --  32.4*  --   NA  --  134*  --  131*  K  --  4.4  --  4.5  CL  --  102  --  102  CO2  --  24  --  21*  BUN  --  36*  --  31*  CREATININE  --  1.53*  --  1.25*   Liver Panel Recent Labs    05/12/19 1244  PROT 5.1*  ALBUMIN 2.1*  AST 24  ALT 15  ALKPHOS 29*  BILITOT 0.6    Microbiology: Group b strep on cx Studies/Results: DG C-Arm 1-60 Min-No Report  Result Date: 05/12/2019 Fluoroscopy was utilized by the requesting physician.  No radiographic interpretation.   DG  HIP OPERATIVE UNILAT W OR W/O PELVIS LEFT  Result Date: 05/12/2019 CLINICAL DATA:  Left hip intraoperative evaluation EXAM: OPERATIVE left HIP (WITH PELVIS IF PERFORMED) 2 VIEWS TECHNIQUE: Fluoroscopic spot image(s) were submitted for interpretation post-operatively. COMPARISON:  05/08/2019 FINDINGS: Similar appearance of left hip arthroplasty. No change in orientation of femoral and acetabular components. No acute bone abnormality. Two intraoperative views are submitted. Fluoroscopy time: 1 second IMPRESSION: Left hip arthroplasty with similar anatomic alignment. Electronically Signed   By: Zetta Bills M.D.   On: 05/12/2019 12:40   [place] EKG SITE RITE  Result Date: 05/11/2019 If Site Rite image not attached, placement could not be confirmed due to current cardiac rhythm.  Hypoechoic anterior proximal left thigh deep subcutaneous collection localized, measuring approximately 11.2 x 4 x 7.6 cm. 185 mL fluid aspirated as above. No regional visible fluid post  aspiration.  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 Recommend getting cultures from OR as well. Suspect that it is all due to group b strep. Will plan for 6 wk of IV therapy followed by oral suppression with amoxicillin, unless different pathogen is isolated  Deep thigh abscess = hopefully will also get I x D today, will be covered by cefazolin  LV thrombus = will need anticoagulation, defer to primary team  [facility] for Infectious Diseases Cell: [phone] Pager: [phone]  05/12/2019, 2:28 PM

## 2019-05-12 NOTE — Discharge Instructions (Signed)
    Left Hip Infection: Diet: As you were doing prior to hospitalization   Shower:  May shower but keep the wounds dry, use an occlusive plastic wrap, NO SOAKING IN TUB.  If the bandage gets wet, change with a clean dry gauze.    Dressing:  Keep dressings on and dry until follow up.  Activity:  Increase activity slowly as tolerated, but follow the weight bearing instructions below.  The rules on driving is that you can not be taking narcotics while you drive, and you must feel in control of the vehicle.    Weight Bearing:  As tolerated.  To prevent constipation: you may use a stool softener such as -  Colace (over the counter) 100 mg by mouth twice a day  Drink plenty of fluids (prune juice may be helpful) and high fiber foods Miralax (over the counter) for constipation as needed.    Itching:  If you experience itching with your medications, try taking only a single pain pill, or even half a pain pill at a time.  You can also use benadryl over the counter for itching or also to help with sleep.   Precautions:  If you experience chest pain or shortness of breath - call 911 immediately for transfer to the hospital emergency department!!  If you develop a fever greater that 101 F, purulent drainage from wound, increased redness or drainage from wound, or calf pain -- Call the office at [phone]                                                 Follow- Up Appointment:  Please call for an appointment to be seen in 10 days [place] - [phone]

## 2019-05-12 NOTE — Evaluation (Addendum)
Physical Therapy Evaluation Patient Details Name: Casey [name] MRN: [id number] DOB: 06/17/1929 Today's Date: 05/12/2019   History of Present Illness  Casey [name] is an 86 y.o. male with history of diabetes mellitus, hypothyroidism, hypertension, chronic kidney disease stage III, anemia has had a hip surgery with replacement in November 2020. In February 2021 patient has had a strokelike symptom and was found to have an LV thrombus and was placed on apixaban.  Patient states since then patient has been having some hip pain.  Over the last few weeks the hip pain has worsened and patient start developing fever and chills over the last 5 days PTA. Patient has been taking antibiotics.  Patient was admitted to [facility] on 05/09/19 for concerns of septic hip arthritis and  infection and possible Lt thigh abcess. Pt is now s/p I&D to Lt hip.    Clinical Impression  Casey [name] is a 86 y.o. male s/p I&D of Lt THA for septic arthritis of prosthetic joint. Patient reports independence with rollator with mobility since recovering from hip surgery in November 2020. Patient is now limited by functional impairments (see PT problem list below) and requires min assist for transfers and gait with RW. Patient was able to ambulate ~50 feet with RW and min assist. Patient instructed in exercise to facilitate circulation. Patient will benefit from continued skilled PT interventions to address impairments and progress towards PLOF. Acute PT will follow to progress mobility and stair training in preparation for safe discharge home.     Follow Up Recommendations Home health PT;Follow surgeon's recommendation for DC plan and follow-up therapies    Equipment Recommendations  None recommended by PT(pt has all DME)    Recommendations for Other Services       Precautions / Restrictions Precautions Precautions: Fall Precaution Comments: pt reports several falls in last 6 months with most recent this past  Saturday Restrictions Weight Bearing Restrictions: No Other Position/Activity Restrictions: WBAT      Mobility  Bed Mobility Overal bed mobility: Needs Assistance Bed Mobility: Supine to Sit     Supine to sit: Min assist;HOB elevated     General bed mobility comments: assist to reach for bed rail and bring LE's off EOB, assist to raise trunk  Transfers Overall transfer level: Needs assistance Equipment used: Rolling walker (2 wheeled) Transfers: Sit to/from Stand Sit to Stand: Min assist         General transfer comment: cues for hand placement/technique with RW, assist required for power up and to complete rise. Pt perfirmed for EOB and from toilet with cues to use grab bar.  Ambulation/Gait Ambulation/Gait assistance: Min assist Gait Distance (Feet): 50 Feet Assistive device: Rolling walker (2 wheeled) Gait Pattern/deviations: Step-to pattern;Decreased stride length Gait velocity: decreased   General Gait Details: cues for safe step pattern needed and assist for proximity. repeated cues required for posture and to keep walker on floor for safety.   Stairs       Wheelchair Mobility    Modified Rankin (Stroke Patients Only)       Balance Overall balance assessment: Needs assistance Sitting-balance support: Feet supported Sitting balance-Leahy Scale: Good     Standing balance support: During functional activity;Bilateral upper extremity supported Standing balance-Leahy Scale: Poor        Pertinent Vitals/Pain Pain Assessment: Faces Faces Pain Scale: Hurts little more Pain Location: Lt hip Pain Descriptors / Indicators: Grimacing;Discomfort;Aching Pain Intervention(s): Limited activity within patient's tolerance;Monitored during session;Repositioned    Home Living Family/patient  expects to be discharged to:: Private residence Living Arrangements: Alone Available Help at Discharge: Family;Available 24 hours/day(pt recentlyl married and his new wife  and hildren can assist) Type of Home: House Home Access: Stairs to enter Entrance Stairs-Rails: None Entrance Stairs-Number of Steps: 1 Home Layout: One level Home Equipment: Grab bars - tub/shower;Grab bars - toilet;Walker - 2 wheels;Walker - 4 wheels;Bedside commode;Cane - single point      Prior Function Level of Independence: Independent with assistive device(s)         Comments: pt using rollator for mobility      Hand Dominance   Dominant Hand: Right    Extremity/Trunk Assessment   Upper Extremity Assessment Upper Extremity Assessment: Overall WFL for tasks assessed    Lower Extremity Assessment Lower Extremity Assessment: Generalized weakness    Cervical / Trunk Assessment Cervical / Trunk Assessment: Kyphotic  Communication   Communication: No difficulties  Cognition Arousal/Alertness: Awake/alert Behavior During Therapy: WFL for tasks assessed/performed Overall Cognitive Status: Within Functional Limits for tasks assessed         General Comments      Exercises Total Joint Exercises Ankle Circles/Pumps: AROM;Both;15 reps;Seated   Assessment/Plan    PT Assessment Patient needs continued PT services  PT Problem List Decreased strength;Decreased balance;Decreased activity tolerance;Decreased mobility;Decreased knowledge of use of DME       PT Treatment Interventions DME instruction;Gait training;Stair training;Functional mobility training    PT Goals (Current goals can be found in the Care Plan section)  Acute Rehab PT Goals Patient Stated Goal: to return home PT Goal Formulation: With patient Time For Goal Achievement: 05/19/19 Potential to Achieve Goals: Good    Frequency Min 5X/week    AM-PAC PT "6 Clicks" Mobility  Outcome Measure Help needed turning from your back to your side while in a flat bed without using bedrails?: A Little Help needed moving from lying on your back to sitting on the side of a flat bed without using bedrails?: A  Little Help needed moving to and from a bed to a chair (including a wheelchair)?: A Little Help needed standing up from a chair using your arms (e.g., wheelchair or bedside chair)?: A Little Help needed to walk in hospital room?: A Little Help needed climbing 3-5 steps with a railing? : A Little 6 Click Score: 18    End of Session Equipment Utilized During Treatment: Gait belt Activity Tolerance: Patient tolerated treatment well Patient left: in chair;with call bell/phone within reach;with chair alarm set;with family/visitor present Nurse Communication: Mobility status PT Visit Diagnosis: History of falling (Z91.81);Muscle weakness (generalized) (M62.81);Difficulty in walking, not elsewhere classified (R26.2)    Time: [id number] PT Time Calculation (min) (ACUTE ONLY): 25 min   Charges:   PT Evaluation $PT Eval Low Complexity: 1 Low PT Treatments $Gait Training: 8-22 mins        Verner Mould, DPT Physical Therapist with [facility] [phone]  05/12/2019 4:13 PM

## 2019-05-12 NOTE — Anesthesia Preprocedure Evaluation (Signed)
Anesthesia Evaluation  Patient identified by MRN, date of birth, ID band Patient awake    Reviewed: Allergy & Precautions, NPO status , Patient's Chart, lab work & pertinent test results  Airway Mallampati: II  TM Distance: >3 FB Neck ROM: Full    Dental  (+) Dental Advisory Given   Pulmonary former smoker,    breath sounds clear to auscultation       Cardiovascular + CAD, + Past MI and +CHF   Rhythm:Regular Rate:Normal  03/06/2019. Moderately impaired LV systolic function with segmental wall motion abnormalities. EF 30 to 35%. No concentric LVH. Dilated LA/RA/RV. Mild MR. Unable to assess RV systolic pressure. LV thrombus present.    Neuro/Psych negative neurological ROS     GI/Hepatic negative GI ROS, Neg liver ROS,   Endo/Other  diabetesHypothyroidism   Renal/GU Renal InsufficiencyRenal disease     Musculoskeletal  (+) Arthritis ,   Abdominal   Peds  Hematology  (+) anemia ,   Anesthesia Other Findings   Reproductive/Obstetrics                             Lab Results  Component Value Date   WBC 6.3 05/12/2019   HGB 10.1 (L) 05/12/2019   HCT 32.4 (L) 05/12/2019   MCV 85.3 05/12/2019   PLT 307 05/12/2019   Lab Results  Component Value Date   CREATININE 1.53 (H) 05/11/2019   BUN 36 (H) 05/11/2019   NA 134 (L) 05/11/2019   K 4.4 05/11/2019   CL 102 05/11/2019   CO2 24 05/11/2019     Anesthesia Physical Anesthesia Plan  ASA: IV  Anesthesia Plan: General   Post-op Pain Management:    Induction: Intravenous  PONV Risk Score and Plan: 2 and Dexamethasone, Ondansetron and Treatment may vary due to age or medical condition  Airway Management Planned: Oral ETT  Additional Equipment:   Intra-op Plan:   Post-operative Plan: Extubation in OR  Informed Consent: I have reviewed the patients History and Physical, chart, labs and discussed the procedure including the risks,  benefits and alternatives for the proposed anesthesia with the patient or authorized representative who has indicated his/her understanding and acceptance.     Dental advisory given  Plan Discussed with: CRNA  Anesthesia Plan Comments:         Anesthesia Quick Evaluation

## 2019-05-12 NOTE — Anesthesia Procedure Notes (Signed)
Procedure Name: Intubation Date/Time: 05/12/2019 10:33 AM Performed by: Lavina Hamman, CRNA Pre-anesthesia Checklist: Patient identified, Emergency Drugs available, Suction available, Patient being monitored and Timeout performed Patient Re-evaluated:Patient Re-evaluated prior to induction Oxygen Delivery Method: Circle system utilized Preoxygenation: Pre-oxygenation with 100% oxygen Induction Type: IV induction Ventilation: Mask ventilation without difficulty Laryngoscope Size: Mac and 3 Grade View: Grade II Tube type: Oral Tube size: 8.0 mm Number of attempts: 1 Airway Equipment and Method: Stylet Placement Confirmation: ETT inserted through vocal cords under direct vision,  positive ETCO2,  CO2 detector and breath sounds checked- equal and bilateral Secured at: 22 cm Tube secured with: Tape Dental Injury: Teeth and Oropharynx as per pre-operative assessment

## 2019-05-12 NOTE — Anesthesia Postprocedure Evaluation (Signed)
Anesthesia Post Note  Patient: Casey [name]  Procedure(s) Performed: irrigation and debridement septic hip headliner exchange (Left Hip)     Patient location during evaluation: PACU Anesthesia Type: General Level of consciousness: awake and alert Pain management: pain level controlled Vital Signs Assessment: post-procedure vital signs reviewed and stable Respiratory status: spontaneous breathing, nonlabored ventilation, respiratory function stable and patient connected to nasal cannula oxygen Cardiovascular status: blood pressure returned to baseline and stable Postop Assessment: no apparent nausea or vomiting Anesthetic complications: no    Last Vitals:  Vitals:   05/12/19 1315 05/12/19 1346  BP: 112/68 121/63  Pulse: 81 83  Resp: 12 16  Temp:  36.5 C  SpO2: 100% 96%    Last Pain:  Vitals:   05/12/19 1346  TempSrc: Oral  PainSc:                  Tiajuana Amass

## 2019-05-13 ENCOUNTER — Encounter: Payer: Self-pay | Admitting: *Deleted

## 2019-05-13 DIAGNOSIS — N183 Chronic kidney disease, stage 3 unspecified: Secondary | ICD-10-CM | POA: Diagnosis not present

## 2019-05-13 DIAGNOSIS — I513 Intracardiac thrombosis, not elsewhere classified: Secondary | ICD-10-CM | POA: Diagnosis not present

## 2019-05-13 DIAGNOSIS — I25810 Atherosclerosis of coronary artery bypass graft(s) without angina pectoris: Secondary | ICD-10-CM | POA: Diagnosis not present

## 2019-05-13 DIAGNOSIS — M009 Pyogenic arthritis, unspecified: Secondary | ICD-10-CM | POA: Diagnosis not present

## 2019-05-13 LAB — GLUCOSE, CAPILLARY
Glucose-Capillary: 142 mg/dL — ABNORMAL HIGH (ref 70–99)
Glucose-Capillary: 172 mg/dL — ABNORMAL HIGH (ref 70–99)
Glucose-Capillary: 179 mg/dL — ABNORMAL HIGH (ref 70–99)
Glucose-Capillary: 216 mg/dL — ABNORMAL HIGH (ref 70–99)

## 2019-05-13 LAB — CBC
HCT: 29.5 % — ABNORMAL LOW (ref 39.0–52.0)
Hemoglobin: 9.3 g/dL — ABNORMAL LOW (ref 13.0–17.0)
MCH: 27.2 pg (ref 26.0–34.0)
MCHC: 31.5 g/dL (ref 30.0–36.0)
MCV: 86.3 fL (ref 80.0–100.0)
Platelets: 295 10*3/uL (ref 150–400)
RBC: 3.42 MIL/uL — ABNORMAL LOW (ref 4.22–5.81)
RDW: 15.2 % (ref 11.5–15.5)
WBC: 7.9 10*3/uL (ref 4.0–10.5)
nRBC: 0 % (ref 0.0–0.2)

## 2019-05-13 LAB — BASIC METABOLIC PANEL WITH GFR
Anion gap: 7 (ref 5–15)
BUN: 31 mg/dL — ABNORMAL HIGH (ref 8–23)
CO2: 22 mmol/L (ref 22–32)
Calcium: 9.8 mg/dL (ref 8.9–10.3)
Chloride: 104 mmol/L (ref 98–111)
Creatinine, Ser: 1.24 mg/dL (ref 0.61–1.24)
GFR calc Af Amer: >60 mL/min
GFR calc non Af Amer: 52 mL/min — ABNORMAL LOW
Glucose, Bld: 171 mg/dL — ABNORMAL HIGH (ref 70–99)
Potassium: 4.7 mmol/L (ref 3.5–5.1)
Sodium: 133 mmol/L — ABNORMAL LOW (ref 135–145)

## 2019-05-13 LAB — MAGNESIUM: Magnesium: 1.8 mg/dL (ref 1.7–2.4)

## 2019-05-13 MED ORDER — PENICILLIN G POTASSIUM 20000000 UNITS IJ SOLR
4.0000 10*6.[IU] | INTRAVENOUS | Status: DC
  Administered 2019-05-13 – 2019-05-14 (×6): 4 10*6.[IU] via INTRAVENOUS
  Filled 2019-05-13 (×10): qty 4

## 2019-05-13 MED ORDER — PENICILLIN G POTASSIUM IV (FOR PTA / DISCHARGE USE ONLY): 24.0000 10*6.[IU] | INTRAVENOUS | 0 refills | Status: DC

## 2019-05-13 NOTE — Progress Notes (Signed)
Physical Therapy Treatment Patient Details Name: Casey [name] MRN: [id number] DOB: [birth year] Today's Date: 05/13/2019    History of Present Illness Casey [name] is an 86 y.o. male with history of diabetes mellitus, hypothyroidism, hypertension, chronic kidney disease stage III, anemia has had a hip surgery with replacement in November 2020. In February 2021 patient has had a strokelike symptom and was found to have an LV thrombus and was placed on apixaban.  Patient states since then patient has been having some hip pain.  Over the last few weeks the hip pain has worsened and patient start developing fever and chills over the last 5 days PTA. Patient has been taking antibiotics.  Patient was admitted to [facility] on 05/09/19 for concerns of septic hip arthritis and  infection and possible Lt thigh abcess. Casey is now s/p I&D to Lt hip on 05/12/19.    Casey [name]    POD # 1 pm session Assisted with amb in hallway a second time.  Tolerated an increased distance. Then returned to room and performed some TE's followed by ICE.  Casey plans to D/C to home tomorrow.   Follow Up Recommendations  Home health Casey;Follow surgeon's recommendation for DC plan and follow-up therapies     Equipment Recommendations  None recommended by Casey    Recommendations for Other Services       Precautions / Restrictions Precautions Precautions: Fall Precaution [name]: Casey reports several falls in last 6 months with most recent this past Saturday Restrictions Weight Bearing Restrictions: No Other Position/Activity Restrictions: WBAT    Mobility  Bed Mobility Overal bed mobility: Needs Assistance Bed Mobility: Supine to Sit;Sit to Supine     Supine to sit: Supervision Sit to supine: Supervision;Min guard   General bed mobility [name]: demonstarted and instructed how to use a belt to self assist LE  Transfers Overall transfer level: Needs assistance Equipment used: Rolling walker (2 wheeled) Transfers: Sit  to/from Stand Sit to Stand: Supervision;Min guard         General transfer comment: 25% VC's on proper hand placement and increased time as well as safety with turns  Ambulation/Gait Ambulation/Gait assistance: Supervision;Min guard Gait Distance (Feet): 125 Feet Assistive device: Rolling walker (2 wheeled) Gait Pattern/deviations: Step-to pattern;Decreased stride length Gait velocity: WNL   General Gait Details: 25% VC's on proper walker to self distance and safety as Casey is impulsive   Marine scientist Rankin (Stroke Patients Only)       Balance                                            Cognition Arousal/Alertness: Awake/alert Behavior During Therapy: WFL for tasks assessed/performed Overall Cognitive Status: Within Functional Limits for tasks assessed                                        Exercises  10 reps AP, knee presses, HS, ABd/ADd and SAQ's    General [name]        Pertinent Vitals/Pain Pain Assessment: No/denies pain Pain Score: 3  Faces Pain Scale: Hurts little more Pain Location: L hip Pain Descriptors / Indicators: Grimacing;Discomfort;Aching;Operative site guarding Pain Intervention(s): Monitored during session;Premedicated before session;Repositioned;Ice  applied    Home Living                      Prior Function            Casey Goals (current goals can now be found in the care plan section) Progress towards Casey goals: Progressing toward goals    Frequency    Min 5X/week      Casey Plan Current plan remains appropriate    Co-evaluation              AM-PAC Casey "6 Clicks" Mobility   Outcome Measure  Help needed turning from your back to your side while in a flat bed without using bedrails?: A Little Help needed moving from lying on your back to sitting on the side of a flat bed without using bedrails?: A Little Help needed moving to and from a  bed to a chair (including a wheelchair)?: A Little Help needed standing up from a chair using your arms (e.g., wheelchair or bedside chair)?: A Little Help needed to walk in hospital room?: A Little Help needed climbing 3-5 steps with a railing? : A Little 6 Click Score: 18    End of Session Equipment Utilized During Treatment: Gait belt Activity Tolerance: Patient tolerated treatment well Patient left: in chair;with call bell/phone within reach;with chair alarm set;with family/visitor present Nurse Communication: Mobility status Casey Visit Diagnosis: History of falling (Z91.81);Muscle weakness (generalized) (M62.81);Difficulty in walking, not elsewhere classified (R26.2)     Time: 1635-1700 Casey Time Calculation (min) (ACUTE ONLY): 25 min  Charges:  $Gait Training: 8-22 mins $Therapeutic Exercise: 8-22 mins                     Rica Koyanagi  PTA Acute  Rehabilitation Services Pager      [phone] Office      [phone]

## 2019-05-13 NOTE — Plan of Care (Signed)
  Problem: Education: Goal: Knowledge of General Education information will improve Description: Including pain rating scale, medication(s)/side effects and non-pharmacologic comfort measures Outcome: Progressing   Problem: Pain Managment: Goal: General experience of comfort will improve Outcome: Progressing   

## 2019-05-13 NOTE — Progress Notes (Addendum)
PROGRESS NOTE  Casey [name] [id number] DOB: 10-23-1929 DOA: 05/09/2019 PCP: Glenda Chroman, MD   LOS: 4 days   Brief narrative: As per HPI,  Casey [name] is an 86 y.o. male with history of diabetes mellitus, hypothyroidism, hypertension, chronic kidney disease stage III, anemia with history of  hip surgery with replacement in November 2020.  Following which in February 2021, patient has had a strokelike symptom and was found to have an LV thrombus and was placed on xarelto.  Patient states since then patient has been having some hip pain.  Over the last few weeks, the hip pain has been worse and he had gone to visit his primary care. Patient started developing fever and chills over the last 5 days patient has been taking antibiotics.  Patient's son came to visit him and found that patient was not doing well and brought to the ER at [facility]   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 Orthopedics on board.  Status post aspiration of the joint with findings of septic arthritis.  Patient underwentirrigation and debridement on 05/12/2019. Seen by orthopedics for follow-up.  Patient is doing well.  Continue IV antibiotics on discharge.  Home health will need to be set up on discharge.  ID on board.  Status post PICC line.   Recently diagnosed LV apical thrombus on anticoagulation (xarelto)as outpatient, was diagnosed in February 2021.    Continue Xarelto   Chronic kidney disease stage IIIb  We will closely monitor BMP.  Creatinine of 1.2.  Will closely monitor.   Chronic anemia likely from renal disease Monitor CBC,transfuse as necessary.    Hemoglobin of 9.3.   Status post 2 units of  packed RBC transfusion.   Hypothyroidism  Continue Synthroid.   Mild hyponatremia Sodium of 133 today.  We will continue to monitor.   Diabetes mellitus type 2 Continue sliding scale insulin, Accu-Cheks diabetic diet.  POC glucose of 172.   Nonobstructive CAD  No acute issues.  Continue metoprolol and Xarelto.    Stage 2 pressure injury coccyx - POA Continue pressure ulcer prevention protocol.  VTE Prophylaxis: Xarelto  Code Status: Full code  Family Communication: I spoke with the patient daughter on the phone and updated her about the clinical condition of the patient and the plan for disposition tomorrow.  Status is: Inpatient  Remains inpatient appropriate because:IV antibiotics, status post surgery,  Dispo: The patient is from: Home              Anticipated d/c is to: Home with home health              Anticipated d/c date is: 1 day              Patient currently is medically stable to d/c.   Barrier to discharge: Lack of caregiver support, home health set up, outpatient antibiotic.  Home health order for PT and RN has been signed.  Outpatient antibiotic order has been signed as well.  Consultants:  Orthopedics  Infectious disease  Procedures:  Joint aspiration left hip  PRBC transfusion  Antibiotics:  . Vancomycin and cefepime discontinued . Penicillin G 4/14>  Anti-infectives (  From admission, onward)   Start     Dose/Rate Route Frequency Ordered Stop   05/13/19 1400  penicillin G potassium 4 Million Units in dextrose 5 % 250 mL IVPB     4 Million Units 250 mL/hr over 60 Minutes Intravenous Every 4 hours 05/13/19 1153     05/13/19 0000  penicillin G IVPB     24 Million Units Intravenous Continuous 05/13/19 1207 06/24/19 2359   05/12/19 1800  ceFAZolin (ANCEF) IVPB 2g/100 mL premix  Status:  Discontinued     2 g 200 mL/hr over 30 Minutes Intravenous Every 8 hours 05/12/19 1416 05/13/19 1153   05/12/19 0815  ceFAZolin (ANCEF) IVPB 2g/100 mL premix      2 g 200 mL/hr over 30 Minutes Intravenous On call to O.R. 05/12/19 0803 05/12/19 1021   05/11/19 2200  vancomycin (VANCOREADY) IVPB 1250 mg/250 mL  Status:  Discontinued     1,250 mg 166.7 mL/hr over 90 Minutes Intravenous Every 48 hours 05/09/19 2152 05/11/19 1259   05/11/19 2200  vancomycin (VANCOREADY) IVPB 1500 mg/300 mL  Status:  Discontinued     1,500 mg 150 mL/hr over 120 Minutes Intravenous Every 48 hours 05/11/19 1259 05/12/19 1414   05/11/19 1400  ceFEPIme (MAXIPIME) 2 g in sodium chloride 0.9 % 100 mL IVPB  Status:  Discontinued     2 g 200 mL/hr over 30 Minutes Intravenous Every 12 hours 05/11/19 1301 05/12/19 1414   05/10/19 1400  ceFEPIme (MAXIPIME) 2 g in sodium chloride 0.9 % 100 mL IVPB  Status:  Discontinued     2 g 200 mL/hr over 30 Minutes Intravenous Every 24 hours 05/10/19 1339 05/11/19 1301   05/09/19 1800  piperacillin-tazobactam (ZOSYN) IVPB 3.375 g  Status:  Discontinued     3.375 g 12.5 mL/hr over 240 Minutes Intravenous Every 8 hours 05/09/19 1754 05/10/19 1318   05/09/19 1745  vancomycin (VANCOREADY) IVPB 1500 mg/300 mL     1,500 mg 150 mL/hr over 120 Minutes Intravenous  Once 05/09/19 1754 05/10/19 0135     Subjective: Today, patient is seen and examined at bedside.  Feels much better.  Has ambulated.  Complains of minimal pain.  No nausea vomiting, shortness of breath or chest pain.  Objective: Vitals:   05/13/19 0902 05/13/19 1348  BP: 131/70 121/62  Pulse: 82 82  Resp: 18 16  Temp: 98 F (36.7 C) 97.6 F (36.4 C)  SpO2: 100% 95%    Intake/Output Summary (Last 24 hours) at 05/13/2019 1422 Last data filed at 05/13/2019 0900 Gross per 24 hour  Intake 1697.19 ml  Output 0 ml  Net 1697.19 ml   Filed Weights   05/09/19 0310 05/12/19 0922  Weight: 73.2 kg 72.6 kg   Body mass index is 25.82 kg/m.   Physical Exam: General:  Average built, not in obvious distress HENT: Normocephalic, pupils equally reacting to light and accommodation.  No  scleral pallor or icterus noted. Oral mucosa is moist.  Chest:  Clear breath sounds.  Diminished breath sounds bilaterally. No crackles or wheezes.  CVS: S1 &S2 heard. No murmur.  Regular rate and rhythm. Abdomen: Soft, nontender, nondistended.  Bowel sounds are heard.   Extremities: No cyanosis, clubbing or edema.  Peripheral pulses are palpable.  Left hip site with dressing. Psych: Alert, awake and oriented, normal mood CNS:  No cranial nerve deficits.  Power equal in all extremities.   Skin: Warm and dry.  No rashes noted.  Left hip site with dressing.  Data Review: I have personally reviewed the following laboratory data and studies,  CBC: Recent Labs  Lab 05/09/19 0432 05/10/19 1533 05/12/19 0417 05/13/19 0300  WBC 8.1 6.9 6.3 7.9  NEUTROABS 5.6  --   --   --   HGB 8.0* 8.2* 10.1* 9.3*  HCT 25.7* 26.7* 32.4* 29.5*  MCV 84.3 84.8 85.3 86.3  PLT 433* 383 307 [id number]   Basic Metabolic Panel: Recent Labs  Lab 05/09/19 0432 05/11/19 1200 05/12/19 1244 05/13/19 0300  NA 133* 134* 131* 133*  K 3.9 4.4 4.5 4.7  CL 102 102 102 104  CO2 22 24 21* 22  GLUCOSE 52* 133* 212* 171*  BUN 39* 36* 31* 31*  CREATININE 1.67* 1.53* 1.25* 1.24  CALCIUM 10.5* 10.2 9.3 9.8  MG  --   --  1.6* 1.8  PHOS  --   --  3.0  --    Liver Function Tests: Recent Labs  Lab 05/09/19 0432 05/12/19 1244  AST 20 24  ALT 16 15  ALKPHOS 28* 29*  BILITOT 0.3 0.6  PROT 5.1* 5.1*  ALBUMIN 2.0* 2.1*   No results for input(s): LIPASE, AMYLASE in the last 168 hours. No results for input(s): AMMONIA in the last 168 hours. Cardiac Enzymes: No results for input(s): CKTOTAL, CKMB, CKMBINDEX, TROPONINI in the last 168 hours. BNP (last 3 results) No results for input(s): BNP in the last 8760 hours.  ProBNP (last 3 results) No results for input(s): PROBNP in the last 8760 hours.  CBG: Recent Labs  Lab 05/12/19 1235 05/12/19 1707 05/12/19 2150 05/13/19 0750 05/13/19 1202  GLUCAP 195* 294* 293* 142*  172*   Recent Results (from the past 240 hour(s))  Surgical pcr screen     Status: None   Collection Time: 05/09/19  3:21 AM   Specimen: Nasal Mucosa; Nasal Swab  Result Value Ref Range Status   MRSA, PCR NEGATIVE NEGATIVE Final   Staphylococcus aureus NEGATIVE NEGATIVE Final    Comment: (NOTE) The Xpert SA Assay (FDA approved for NASAL specimens in patients 36 years of age and older), is one component of a comprehensive surveillance program. It is not intended to diagnose infection nor to guide or monitor treatment. Performed at [facility], [place] [address], [place], [place] [address]   Culture, blood (routine x 2)     Status: None (Preliminary result)   Collection Time: 05/09/19  4:33 AM   Specimen: BLOOD  Result Value Ref Range Status   Specimen Description BLOOD LEFT FOREARM  Final   Special Requests   Final    BOTTLES DRAWN AEROBIC AND ANAEROBIC Blood Culture adequate volume Performed at [facility], [place] [address], [place], [place] [address]    Culture NO GROWTH 4 DAYS  Final   Report Status PENDING  Incomplete  Culture, blood (routine x 2)     Status: None (Preliminary result)   Collection Time: 05/09/19  4:43 AM   Specimen: BLOOD  Result Value Ref Range Status   Specimen Description BLOOD LEFT HAND  Final   Special Requests   Final    BOTTLES DRAWN AEROBIC ONLY Blood Culture adequate volume Performed at [facility], [place] [address], [place], [place] [address]    Culture NO GROWTH 4 DAYS  Final   Report Status PENDING  Incomplete  Aerobic/Anaerobic Culture (surgical/deep wound)     Status: None (Preliminary result)   Collection Time: 05/09/19  5:06 PM   Specimen: Wound; Abscess  Result Value Ref Range Status   Specimen Description WOUND LEFT THIGH US ASPIRATION  Final   Special Requests NONE  Final   Gram Stain   Final    ABUNDANT WBC PRESENT, PREDOMINANTLY PMN NO ORGANISMS SEEN Performed at [facility], [address],  [place], [place] [address]    Culture   Final    RARE STREPTOCOCCUS AGALACTIAE TESTING AGAINST S. AGALACTIAE NOT ROUTINELY PERFORMED DUE TO PREDICTABILITY OF AMP/PEN/VAN SUSCEPTIBILITY. NO ANAEROBES ISOLATED; CULTURE IN PROGRESS FOR 5 DAYS    Report Status PENDING  Incomplete  SARS CORONAVIRUS 2 (TAT 6-24 HRS) Nasopharyngeal Nasopharyngeal Swab     Status: None   Collection Time: 05/11/19  2:48 PM   Specimen: Nasopharyngeal Swab  Result Value Ref Range Status   SARS Coronavirus 2 NEGATIVE NEGATIVE Final    Comment: (NOTE) SARS-CoV-2 target nucleic acids are NOT DETECTED. The SARS-CoV-2 RNA is generally detectable in upper and lower respiratory specimens during the acute phase of infection. Negative results do not preclude SARS-CoV-2 infection, do not rule out co-infections with other pathogens, and should not be used as the sole basis for treatment or other patient management decisions. Negative results must be combined with clinical observations, patient history, and epidemiological information. The expected result is Negative. Fact Sheet for Patients: SugarRoll.be Fact Sheet for Healthcare Providers: https://www.woods-mathews.com/ This test is not yet approved or cleared by the [place] FDA and  has been authorized for detection and/or diagnosis of SARS-CoV-2 by FDA under an Emergency Use Authorization (EUA). This EUA will remain  in effect (meaning this test can be used) for the duration of the COVID-19 declaration under Section 56 4(b)(1) of the Act, 21 U.S.C. section 360bbb-3(b)(1), unless the authorization is terminated or revoked sooner. Performed at [facility], [place] [address] [place], [place] [address]   Surgical pcr screen     Status: None   Collection Time: 05/12/19  8:14 AM   Specimen: Nasal Mucosa; Nasal Swab  Result Value Ref Range Status   MRSA, PCR NEGATIVE NEGATIVE Final   Staphylococcus aureus NEGATIVE NEGATIVE  Final    Comment: (NOTE) The Xpert SA Assay (FDA approved for NASAL specimens in patients 60 years of age and older), is one component of a comprehensive surveillance program. It is not intended to diagnose infection nor to guide or monitor treatment. Performed at [facility], [place] [address], [place],  [address]      Studies: DG C-Arm 1-60 Min-No Report  Result Date: 05/12/2019 Fluoroscopy was utilized by the requesting physician.  No radiographic interpretation.   DG HIP OPERATIVE UNILAT W OR W/O PELVIS LEFT  Result Date: 05/12/2019 CLINICAL DATA:  Left hip intraoperative evaluation EXAM: OPERATIVE left HIP (WITH PELVIS IF PERFORMED) 2 VIEWS TECHNIQUE: Fluoroscopic spot image(s) were submitted for interpretation post-operatively. COMPARISON:  05/08/2019 FINDINGS: Similar appearance of left hip arthroplasty. No change in orientation of femoral and acetabular components. No acute bone abnormality. Two intraoperative views are submitted. Fluoroscopy time: 1 second IMPRESSION: Left hip arthroplasty with similar anatomic alignment. Electronically Signed   By: Zetta Bills M.D.   On: 05/12/2019 12:40     Flora Lipps, MD  [facility] 05/13/2019

## 2019-05-13 NOTE — Progress Notes (Signed)
Physical Therapy Treatment Patient Details Name: Casey [name] MRN: [id number] DOB: 1929/08/05 Today's Date: 05/13/2019    History of Present Illness Casey [name] is an 86 y.o. male with history of diabetes mellitus, hypothyroidism, hypertension, chronic kidney disease stage III, anemia has had a hip surgery with replacement in November 2020. In February 2021 patient has had a strokelike symptom and was found to have an LV thrombus and was placed on apixaban.  Patient states since then patient has been having some hip pain.  Over the last few weeks the hip pain has worsened and patient start developing fever and chills over the last 5 days PTA. Patient has been taking antibiotics.  Patient was admitted to [facility] on 05/09/19 for concerns of septic hip arthritis and  infection and possible Lt thigh abcess. Pt is now s/p I&D to Lt hip on 05/12/19.    PT Comments    POD # 1 am session Assisted OOB.  General bed mobility comments: demonstarted and instructed how to use a belt to self assist LE.  Assisted with amb. General transfer comment: 25% VC's on proper hand placement and increased time as well as safety with turns  General Gait Details: 25% VC's on proper walker to self distance and safety as pt is impulsive.  Performed some TE's followed by ICE.   Follow Up Recommendations  Home health PT;Follow surgeon's recommendation for DC plan and follow-up therapies     Equipment Recommendations  None recommended by PT    Recommendations for Other Services       Precautions / Restrictions Precautions Precautions: Fall Precaution Comments: pt reports several falls in last 6 months with most recent this past Saturday Restrictions Weight Bearing Restrictions: No Other Position/Activity Restrictions: WBAT    Mobility  Bed Mobility Overal bed mobility: Needs Assistance Bed Mobility: Supine to Sit     Supine to sit: Min assist;HOB elevated     General bed mobility comments: demonstarted and  instructed how to use a belt to self assist LE  Transfers Overall transfer level: Needs assistance Equipment used: Rolling walker (2 wheeled) Transfers: Sit to/from Stand Sit to Stand: Supervision;Min guard         General transfer comment: 25% VC's on proper hand placement and increased time as well as safety with turns  Ambulation/Gait Ambulation/Gait assistance: Supervision;Min guard Gait Distance (Feet): 115 Feet Assistive device: Rolling walker (2 wheeled) Gait Pattern/deviations: Step-to pattern;Decreased stride length Gait velocity: WNL   General Gait Details: 25% VC's on proper walker to self distance and safety as pt is impulsive   [employer] [place] (Stroke Patients Only)       Balance                                            Cognition Arousal/Alertness: Awake/alert Behavior During Therapy: WFL for tasks assessed/performed Overall Cognitive Status: Within Functional Limits for tasks assessed                                        Exercises  10 reps AP, knee presses, ABd/ADd and HS    General Comments        Pertinent Vitals/Pain Pain Assessment:  0-10 Pain Score: 3  Pain Location: L hip Pain Descriptors / Indicators: Grimacing;Discomfort;Aching;Operative site guarding Pain Intervention(s): Monitored during session;Premedicated before session;Repositioned;Ice applied    Home Living                      Prior Function            PT Goals (current goals can now be found in the care plan section) Progress towards PT goals: Progressing toward goals    Frequency    Min 5X/week      PT Plan Current plan remains appropriate    Co-evaluation              AM-PAC PT "6 Clicks" Mobility   Outcome Measure  Help needed turning from your back to your side while in a flat bed without using bedrails?: A Little Help needed moving from lying on your  back to sitting on the side of a flat bed without using bedrails?: A Little Help needed moving to and from a bed to a chair (including a wheelchair)?: A Little Help needed standing up from a chair using your arms (e.g., wheelchair or bedside chair)?: A Little Help needed to walk in hospital room?: A Little Help needed climbing 3-5 steps with a railing? : A Little 6 Click Score: 18    End of Session Equipment Utilized During Treatment: Gait belt Activity Tolerance: Patient tolerated treatment well Patient left: in chair;with call bell/phone within reach;with chair alarm set;with family/visitor present Nurse Communication: Mobility status PT Visit Diagnosis: History of falling (Z91.81);Muscle weakness (generalized) (M62.81);Difficulty in walking, not elsewhere classified (R26.2)     Time: 1245-1315 PT Time Calculation (min) (ACUTE ONLY): 30 min  Charges:  $Gait Training: 8-22 mins $Therapeutic Exercise: 8-22 mins                     Rica Koyanagi  PTA Acute  Rehabilitation Services Pager      [phone] Office      [phone]

## 2019-05-13 NOTE — Plan of Care (Signed)

## 2019-05-13 NOTE — Progress Notes (Signed)
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 To discharging provider:  please sign these orders via discharge navigator,  Select New Orders & click on the button choice - Manage This Unsigned Work.     Thank you for allowing pharmacy to be a part of this patient's care.  Doreene Eland, PharmD, BCPS.   Work Cell: [phone] 05/13/2019 12:02 PM

## 2019-05-13 NOTE — Progress Notes (Addendum)
    Subjective: Patient reports pain as mild to moderate, controlled.  Tolerating diet.  Urinating.  Good early mobilization.  Feels ready to go home.  Has good family support.  Objective:   VITALS:   Vitals:   05/12/19 1703 05/12/19 2148 05/13/19 0112 05/13/19 0525  BP: (!) 144/66 127/61 (!) 137/58 (!) 146/64  Pulse: 75 65 63 62  Resp: 16 19 19 18   Temp: (!) 97.4 F (36.3 C) 97.8 F (36.6 C) (!) 97.5 F (36.4 C) 97.8 F (36.6 C)  TempSrc: Oral Oral Oral   SpO2: 100% 98% 100% 100%  Weight:      Height:       CBC Latest Ref Rng & Units 05/13/2019 05/12/2019 05/10/2019  WBC 4.0 - 10.5 K/uL 7.9 6.3 6.9  Hemoglobin 13.0 - 17.0 g/dL 9.3(L) 10.1(L) 8.2(L)  Hematocrit 39.0 - 52.0 % 29.5(L) 32.4(L) 26.7(L)  Platelets 150 - 400 K/uL 295 307 383   BMP Latest Ref Rng & Units 05/13/2019 05/12/2019 05/11/2019  Glucose 70 - 99 mg/dL 171(H) 212(H) 133(H)  BUN 8 - 23 mg/dL 31(H) 31(H) 36(H)  Creatinine 0.61 - 1.24 mg/dL 1.24 1.25(H) 1.53(H)  Sodium 135 - 145 mmol/L 133(L) 131(L) 134(L)  Potassium 3.5 - 5.1 mmol/L 4.7 4.5 4.4  Chloride 98 - 111 mmol/L 104 102 102  CO2 22 - 32 mmol/L 22 21(L) 24  Calcium 8.9 - 10.3 mg/dL 9.8 9.3 10.2   Intake/Output      04/13 0701 - 04/14 0700 04/14 0701 - 04/15 0700   P.O. 300    I.V. (mL/kg) 2377.2 (32.7)    Blood     IV Piggyback 125    Total Intake(mL/kg) 2802.2 (38.6)    Urine (mL/kg/hr) 0 (0)    Stool 0    Blood 300    Total Output 300    Net +2502.2         Urine Occurrence 6 x    Stool Occurrence 1 x       Physical Exam: General: NAD.  Upright in bed.  Calm, conversant. MSK LLE: Neurovascularly intact Sensation intact distally Feet warm Dorsiflexion/Plantar flexion intact Incision: dressing C/D/I   Assessment: 1 Day Post-Op  S/P Procedure(s) (LRB): irrigation and debridement septic hip headliner exchange (Left) by Dr. Ernesta Amble. Percell Miller on 05/12/2019  Principal Problem:   Left hip prosthetic joint infection ([place]) Active  Problems:   CAD (coronary artery disease) of artery bypass graft   Septic arthritis (HCC)   LV (left ventricular) mural thrombus   CKD (chronic kidney disease), stage III   Anemia   Pressure injury of skin   Left hip PJI, status post irrigation, debridement, headliner exchange Doing well postop day 1 Tolerating diet and voiding Pain controlled Excellent early mobilization  Plan: Up with therapy Incentive Spirometry Apply ice as needed   Weight Bearing: Weight Bearing as Tolerated (WBAT) LLE Dressings: Maintain Mepilex.   VTE prophylaxis: Okay to resume Xarelto for LV thrombus, SCDs, ambulation  Dispo: Likely home when ready medically.  Will need home health nursing and finalized outpatient IV antibiotic regimen per ID recommendations.  Stable for discharge from an orthopedic perspective.  Charna Elizabeth Martensen III, PA-C 05/13/2019, 8:09 AM

## 2019-05-13 NOTE — Care Management Important Message (Signed)
Important Message  Patient Details IM Letter given to Lennart Pall SW Case Manager to present to the Patient Name: Casey [name] MRN: [id number] Date of Birth: 09-25-1929   Medicare Important Message Given:  Yes     Kerin Salen 05/13/2019, 12:05 PM

## 2019-05-13 NOTE — Progress Notes (Signed)
[facility] for Infectious Disease    Date of Admission:  05/09/2019   Total days of antibiotics 5/day 1 PCN   ID: Casey [name] is a 86 y.o. male with gropu b strep left hip pji and left thigh abscess Principal Problem:   Left hip prosthetic joint infection (HCC) Active Problems:   CAD (coronary artery disease) of artery bypass graft   Septic arthritis (HCC)   LV (left ventricular) mural thrombus   CKD (chronic kidney disease), stage III   Anemia   Pressure injury of skin    Subjective: Afebrile, feeling better since yesterday, using ice to treat soreness to left hip.  Medications:  . Chlorhexidine Gluconate Cloth  6 each Topical Daily  . docusate sodium  100 mg Oral BID  . finasteride  5 mg Oral Daily  . insulin aspart  0-5 Units Subcutaneous QHS  . insulin aspart  0-9 Units Subcutaneous TID WC  . levothyroxine  25 mcg Oral QAC breakfast  . metoprolol succinate  25 mg Oral Daily  . rivaroxaban  10 mg Oral Daily  . tamsulosin  0.4 mg Oral Daily    Objective: Vital signs in last 24 hours: Temp:  [97.4 F (36.3 C)-98 F (36.7 C)] 97.6 F (36.4 C) (04/14 1348) Pulse Rate:  [62-82] 82 (04/14 1348) Resp:  [16-19] 16 (04/14 1348) BP: (121-154)/(58-74) 121/62 (04/14 1348) SpO2:  [95 %-100 %] 95 % (04/14 1348) Physical Exam  Constitutional: He is oriented to person, place, and time. He appears well-developed and well-nourished. No distress.  HENT:  Mouth/Throat: Oropharynx is clear and moist. No oropharyngeal exudate.  Cardiovascular: Normal rate, regular rhythm and normal heart sounds. Exam reveals no gallop and no friction rub.  No murmur heard.  Pulmonary/Chest: Effort normal and breath sounds normal. No respiratory distress. He has no wheezes.  Abdominal: Soft. Bowel sounds are normal. He exhibits no distension. There is no tenderness.  [id number] hip bandage in place Neurological: He is alert and oriented to person, place, and time.  Skin: Skin is warm and  dry. No rash noted. No erythema.  Psychiatric: He has a normal mood and affect. His behavior is normal.   Lab Results Recent Labs    05/11/19 1200 05/12/19 0417 05/12/19 1244 05/13/19 0300  WBC  --  6.3  --  7.9  HGB  --  10.1*  --  9.3*  HCT  --  32.4*  --  29.5*  NA   < >  --  131* 133*  K   < >  --  4.5 4.7  CL   < >  --  102 104  CO2   < >  --  21* 22  BUN   < >  --  31* 31*  CREATININE   < >  --  1.25* 1.24   < > = values in this interval not displayed.   Liver Panel Recent Labs    05/12/19 1244  PROT 5.1*  ALBUMIN 2.1*  AST 24  ALT 15  ALKPHOS 29*  BILITOT 0.6   Lab Results  Component Value Date   ESRSEDRATE 26 (H) 05/09/2019    Microbiology: 4/10:deep tissue cx = group b strep Studies/Results: DG C-Arm 1-60 Min-No Report  Result Date: 05/12/2019 Fluoroscopy was utilized by the requesting physician.  No radiographic interpretation.   DG HIP OPERATIVE UNILAT W OR W/O PELVIS LEFT  Result Date: 05/12/2019 CLINICAL DATA:  Left hip intraoperative evaluation EXAM: OPERATIVE left HIP (WITH  PELVIS IF PERFORMED) 2 VIEWS TECHNIQUE: Fluoroscopic spot image(s) were submitted for interpretation post-operatively. COMPARISON:  05/08/2019 FINDINGS: Similar appearance of left hip arthroplasty. No change in orientation of femoral and acetabular components. No acute bone abnormality. Two intraoperative views are submitted. Fluoroscopy time: 1 second IMPRESSION: Left hip arthroplasty with similar anatomic alignment. Electronically Signed   By: Zetta Bills M.D.   On: 05/12/2019 12:40     Assessment/Plan: Group b streptococcus left hip pji = will narrow abtx to penicillin 24MU continuous infusion for 6 wks. opat in place. Patient and family getting abtx/picc line education.  Will need weekly cbc, bmp,  And every other week sed rate nad crp  Will see back in clinic in 4-6 wk  [facility] for Infectious Diseases Cell: [phone] Pager:  [phone]  05/13/2019, 4:46 PM

## 2019-05-13 NOTE — TOC Initial Note (Signed)
Transition of Care [facility]) - Initial/Assessment Note    Patient Details  Name: Casey [name] MRN: [id number] Date of Birth: 12/14/1929  Transition of Care [facility]) CM/SW Contact:    Lennart Pall, LCSW Phone Number: 05/13/2019, 1:53 PM  Clinical Narrative:   Pt very pleasant and feels he is making good progress and eager to return home.  Has excellent support from his 3 adult children with plan that daughter will arrive tomorrow early morning from [place] and will provide 24/7 support.  Aware plan home with IV abx and that I am working on securing a Acupuncturist for [employer] and PT.  Continue to follow.                Expected Discharge Plan: [place] Barriers to Discharge: Continued Medical Work up   Patient Goals and CMS Choice Patient states their goals for this hospitalization and ongoing recovery are:: to go home! CMS Medicare.gov Compare Post Acute Care list provided to:: Patient Choice offered to / list presented to : Patient  Expected Discharge Plan and Services Expected Discharge Plan: [place] In-house Referral: Clinical Social Work   Post Acute Care Choice: [place] arrangements for the past 2 months: [place]                 DME Arranged: N/A(has all needed DME)                    Prior Living Arrangements/Services Living arrangements for the past 2 months: Single Family Home Lives with:: Self Patient language and need for interpreter reviewed:: Yes Do you feel safe going back to the place where you live?: Yes      Need for Family Participation in Patient Care: Yes (Comment) Care giver support system in place?: Yes (comment)(daughter to arrive from [place] tomorrow morning)   Criminal Activity/Legal Involvement Pertinent to Current Situation/Hospitalization: No - Comment as needed  Activities of Daily Living Home Assistive Devices/Equipment: Cane (specify quad or straight) ADL Screening (condition at  time of admission) Patient's cognitive ability adequate to safely complete daily activities?: Yes Is the patient deaf or have difficulty hearing?: No Does the patient have difficulty seeing, even when wearing glasses/contacts?: No Does the patient have difficulty concentrating, remembering, or making decisions?: No Patient able to express need for assistance with ADLs?: Yes Does the patient have difficulty dressing or bathing?: No Independently performs ADLs?: Yes (appropriate for developmental age) Does the patient have difficulty walking or climbing stairs?: Yes Weakness of Legs: Left Weakness of Arms/Hands: None  Permission Sought/Granted Permission sought to share information with : Family Supports    Share Information with NAME: [place] granted to share info w Relationship: daughter  Permission granted to share info w Contact Information: [phone]  Emotional Assessment Appearance:: Appears stated age   Affect (typically observed): Pleasant, Happy Orientation: : Oriented to Self, Oriented to Place, Oriented to  Time, Oriented to Situation Alcohol / Substance Use: Not Applicable Psych Involvement: No (comment)  Admission diagnosis:  Septic arthritis ([place]) [M00.9] Patient Active Problem List   Diagnosis Date Noted  . Left hip prosthetic joint infection ([place]) 05/12/2019  . Pressure injury of skin 05/11/2019  . Septic arthritis ([place]) 05/09/2019  . LV (left ventricular) mural thrombus 05/09/2019  . CKD (chronic kidney disease), stage III 05/09/2019  . Anemia 05/09/2019  . Primary osteoarthritis of left hip 12/03/2018  . Guaiac + stool  06/28/2017  . Diarrhea 08/11/2012  . CAD (coronary artery disease) of artery bypass graft 08/11/2012  . Diabetes ([place]) 08/11/2012  . High cholesterol 08/11/2012   PCP:  Glenda Chroman, MD Pharmacy:   [place], [place] [id number] [place] [id number] Phone: [phone] Fax:  [phone]     Social Determinants of Health (SDOH) Interventions    Readmission Risk Interventions Readmission Risk Prevention Plan 05/13/2019  Transportation Screening Complete  PCP or Specialist Appt within 5-7 Days Complete  Home Care Screening Complete  Medication Review (RN CM) Complete  Some recent data might be hidden

## 2019-05-14 DIAGNOSIS — N183 Chronic kidney disease, stage 3 unspecified: Secondary | ICD-10-CM | POA: Diagnosis not present

## 2019-05-14 DIAGNOSIS — I25810 Atherosclerosis of coronary artery bypass graft(s) without angina pectoris: Secondary | ICD-10-CM | POA: Diagnosis not present

## 2019-05-14 DIAGNOSIS — I513 Intracardiac thrombosis, not elsewhere classified: Secondary | ICD-10-CM | POA: Diagnosis not present

## 2019-05-14 DIAGNOSIS — T8452XA Infection and inflammatory reaction due to internal left hip prosthesis, initial encounter: Secondary | ICD-10-CM | POA: Diagnosis not present

## 2019-05-14 LAB — BASIC METABOLIC PANEL
Anion gap: 7 (ref 5–15)
BUN: 27 mg/dL — ABNORMAL HIGH (ref 8–23)
CO2: 24 mmol/L (ref 22–32)
Calcium: 9.7 mg/dL (ref 8.9–10.3)
Chloride: 103 mmol/L (ref 98–111)
Creatinine, Ser: 1.35 mg/dL — ABNORMAL HIGH (ref 0.61–1.24)
GFR calc Af Amer: 54 mL/min — ABNORMAL LOW (ref >60)
GFR calc non Af Amer: 47 mL/min — ABNORMAL LOW (ref >60)
Glucose, Bld: 104 mg/dL — ABNORMAL HIGH (ref 70–99)
Potassium: 4.5 mmol/L (ref 3.5–5.1)
Sodium: 134 mmol/L — ABNORMAL LOW (ref 135–145)

## 2019-05-14 LAB — CULTURE, BLOOD (ROUTINE X 2)
Culture: NO GROWTH
Culture: NO GROWTH
Special Requests: ADEQUATE
Special Requests: ADEQUATE

## 2019-05-14 LAB — CBC
HCT: 28.3 % — ABNORMAL LOW (ref 39.0–52.0)
Hemoglobin: 9 g/dL — ABNORMAL LOW (ref 13.0–17.0)
MCH: 27.4 pg (ref 26.0–34.0)
MCHC: 31.8 g/dL (ref 30.0–36.0)
MCV: 86.3 fL (ref 80.0–100.0)
Platelets: 260 10*3/uL (ref 150–400)
RBC: 3.28 MIL/uL — ABNORMAL LOW (ref 4.22–5.81)
RDW: 15.6 % — ABNORMAL HIGH (ref 11.5–15.5)
WBC: 6.2 10*3/uL (ref 4.0–10.5)
nRBC: 0 % (ref 0.0–0.2)

## 2019-05-14 LAB — GLUCOSE, CAPILLARY
Glucose-Capillary: 121 mg/dL — ABNORMAL HIGH (ref 70–99)
Glucose-Capillary: 174 mg/dL — ABNORMAL HIGH (ref 70–99)

## 2019-05-14 MED ORDER — METHOCARBAMOL 500 MG PO TABS: 500.0000 mg | ORAL_TABLET | Freq: Four times a day (QID) | ORAL | 0 refills | Status: AC | PRN

## 2019-05-14 NOTE — Progress Notes (Signed)
    Subjective: Patient reports pain as mild, controlled.  Tolerating diet.  Urinating.  Continues to mobilize well.    Objective:   VITALS:   Vitals:   05/13/19 2122 05/13/19 2123 05/14/19 0200 05/14/19 0527  BP:   130/68 116/70  Pulse: 70 72 70 73  Resp:   17 18  Temp:    97.7 F (36.5 C)  TempSrc:    Oral  SpO2:   100% 97%  Weight:      Height:       CBC Latest Ref Rng & Units 05/14/2019 05/13/2019 05/12/2019  WBC 4.0 - 10.5 K/uL 6.2 7.9 6.3  Hemoglobin 13.0 - 17.0 g/dL 9.0(L) 9.3(L) 10.1(L)  Hematocrit 39.0 - 52.0 % 28.3(L) 29.5(L) 32.4(L)  Platelets 150 - 400 K/uL 260 295 307   BMP Latest Ref Rng & Units 05/14/2019 05/13/2019 05/12/2019  Glucose 70 - 99 mg/dL 104(H) 171(H) 212(H)  BUN 8 - 23 mg/dL 27(H) 31(H) 31(H)  Creatinine 0.61 - 1.24 mg/dL 1.35(H) 1.24 1.25(H)  Sodium 135 - 145 mmol/L 134(L) 133(L) 131(L)  Potassium 3.5 - 5.1 mmol/L 4.5 4.7 4.5  Chloride 98 - 111 mmol/L 103 104 102  CO2 22 - 32 mmol/L 24 22 21(L)  Calcium 8.9 - 10.3 mg/dL 9.7 9.8 9.3   Intake/Output      04/14 0701 - 04/15 0700 04/15 0701 - 04/16 0700   P.O. 700    I.V. (mL/kg) 298 (4.1)    IV Piggyback 1000    Total Intake(mL/kg) 1998 (27.5)    Urine (mL/kg/hr)     Stool     Blood     Total Output     Net +1998         Urine Occurrence 6 x    Stool Occurrence 2 x       Physical Exam: General: NAD.  Upright in bed.  Calm, conversant.  Daughter at bedside. MSK LLE: Neurovascularly intact Sensation intact distally Feet warm Dorsiflexion/Plantar flexion intact Incision: dressing C/D/I   Assessment: 2 Days Post-Op  S/P Procedure(s) (LRB): irrigation and debridement septic hip headliner exchange (Left) by Dr. Ernesta Amble. Percell Miller on 05/12/2019  Principal Problem:   Left hip prosthetic joint infection ([place]) Active Problems:   CAD (coronary artery disease) of artery bypass graft   Septic arthritis (HCC)   LV (left ventricular) mural thrombus   CKD (chronic kidney disease), stage  [name]   Anemia   Pressure injury of skin   Left hip PJI, status post irrigation, debridement, headliner exchange Doing well postop day 2 Tolerating diet and voiding Pain controlled Excellent early mobilization  Plan: Up with therapy Incentive Spirometry Apply ice as needed   Weight Bearing: Weight Bearing as Tolerated (WBAT) LLE Dressings: Maintain Mepilex.   VTE prophylaxis: He has resumed Xarelto for LV thrombus, SCDs, ambulation  Dispo:  Planning for home today.   Follow up in the office with Dr. Alain Marion in 1-2 weeks.  Please call with questions.   Casey Burly III, PA-C 05/14/2019, 8:54 AM

## 2019-05-14 NOTE — Discharge Summary (Signed)
Physician Discharge Summary  GRYFFIN ALTICE SUO:[id number] DOB: Aug 25, 1929 DOA: 05/09/2019  PCP: Glenda Chroman, MD  Admit date: 05/09/2019 Discharge date: 05/14/2019  Admitted From: Home  Discharge disposition: Home with Home PT and home health   Recommendations for Outpatient Follow-Up:   . Follow up with your primary care provider, orthopedics and infectious disease as outpatient. . Check CBC BMP as per home health   Discharge Diagnosis:   Principal Problem:   Left hip prosthetic joint infection ([place]) Active Problems:   CAD (coronary artery disease) of artery bypass graft   Septic arthritis (HCC)   LV (left ventricular) mural thrombus   CKD (chronic kidney disease), stage III   Anemia   Pressure injury of skin   Discharge Condition: Improved.  Diet recommendation:  Carbohydrate-modified.   Wound care: Local hip surgical site care  Code status: Full.   History of Present Illness:   [place] Blizzard Flinchumis an 86 y.o.malewithhistory of diabetes mellitus, hypothyroidism, hypertension, chronic kidney disease stage III, anemia with history of  hip surgery with replacement in November 2020. Following which in February 2021, patient has had a strokelike symptom and was found to have an LV thrombus and was placed on xarelto. Patient states since then patient has been having some hip pain. Over the last few weeks, the hip pain has been worse and he had gone to visit his primary care. Patient started developing fever and chills over the last 5 days patient has been taking antibiotics. Patient's son came to visit him and found that patient was not doing well and brought to the ER at [facility].   Hospital Course:   Following conditions were addressed during hospitalization as listed below,  Septic arthritis of the left hip prosthesis. Orthopedics was consulted. Status post aspiration of the joint with findings of septic arthritis.  Patient underwentirrigation and debridement  on 05/12/2019. Seen by orthopedics for follow-up.  Patient is doing well.  Patient was also seen by infectious disease. Continue IV antibiotics on discharge.  Home health will be set on discharge for IV antibiotics and home PT.  Status post PICC line.    Recently diagnosed LV apical thrombus on anticoagulation (xarelto)as outpatient, was diagnosed in February 2021.  Continue Xarelto  on discharge  Chronic kidney disease stage IIIb  Creatinine of 1.3.    Need to closely monitor as outpatient.  Check BMP in a.m.  Chronic anemia likely from renal disease  Hemoglobin of 9.0 prior to discharge.   Status post 2 units of packed RBC transfusion during hospitalization.  Hypothyroidism Continue Synthroid.  Mild hyponatremia Sodium of 134 today.    Check BMP in a.m.  Diabetes mellitus type 2 Patient will resume diabetic diet and glimepiride on discharge.    Nonobstructive CAD No acute issues.  Continue metoprolol and Xarelto.   Stage 2 pressure injury coccyx - POA Continue pressure ulcer prevention protocol.  Disposition.  At this time, patient is stable for disposition home with home health.  I have spoken with the patient's daughter yesterday regarding disposition plan for today.  Medical Consultants:    Infectious disease  Orthopedics  Procedures:     irrigation and debridement septic hip headliner exchange on 05/12/2019 Subjective:   Today, patient feels better.  Denies any fever, chills or rigor.  No hip pain.  Has ambulated yesterday.  Discharge Exam:   Vitals:   05/14/19 0200 05/14/19 0527  BP: 130/68 116/70  Pulse: 70 73  Resp: 17 18  Temp:  97.7 F (36.5 C)  SpO2: 100% 97%   Vitals:   05/13/19 2122 05/13/19 2123 05/14/19 0200 05/14/19 0527  BP:   130/68 116/70  Pulse: 70 72 70 73  Resp:   17 18  Temp:    97.7 F (36.5 C)  TempSrc:    Oral  SpO2:   100% 97%  Weight:      Height:        General: Alert awake, not in obvious distress, average  built HENT: pupils equally reacting to light,  No scleral pallor or icterus noted. Oral mucosa is moist.  Chest:  Clear breath sounds.  Diminished breath sounds bilaterally. No crackles or wheezes.  CVS: S1 &S2 heard. No murmur.  Regular rate and rhythm. Abdomen: Soft, nontender, nondistended.  Bowel sounds are heard.   Extremities: No cyanosis, clubbing or edema.  Peripheral pulses are palpable.  Left hip site with dressing. Psych: Alert, awake and oriented, normal mood CNS:  No cranial nerve deficits.  Power equal in all extremities.   Skin: Warm and dry.  No rashes noted.  Left hip site with dressing  The results of significant diagnostics from this hospitalization (including imaging, microbiology, ancillary and laboratory) are listed below for reference.     Diagnostic Studies:   [place] FNA SOFT TISSUE  Result Date: 05/10/2019 INDICATION: Multiloculated fluid collections with enhancing walls extend from the left hip arthroplasty and hip joint into the surrounding soft tissues most consistent with infected collections/abscesses and in keeping with infected arthroplasty.  Aspiration requested EXAM: ULTRASOUND GUIDED ASPIRATION  OF LEFT THIGH COLLECTION MEDICATIONS: Lidocaine 1% subcutaneous ANESTHESIA/SEDATION: None required PROCEDURE: The procedure, risks, benefits, and alternatives were explained to the patient. Questions regarding the procedure were encouraged and answered. The patient understands and consents to the procedure. Survey ultrasound of the anterior proximal left thigh was performed and the dominant collection was localized. An appropriate skin entry site was determined and marked. The operative field was prepped with chlorhexidine in a sterile fashion, and a sterile drape was applied covering the operative field. A sterile gown and sterile gloves were used for the procedure. Local anesthesia was provided with 1% Lidocaine. 7 cm multi sidehole Yueh sheath needle advanced into the  collection. 185 mL opaque beige thin fluid were aspirated, sent for Gram stain and culture. The patient tolerated the procedure well. COMPLICATIONS: None immediate. FINDINGS: Hypoechoic anterior proximal left thigh deep subcutaneous collection localized, measuring approximately 11.2 x 4 x 7.6 cm. 185 mL fluid aspirated as above. No regional visible fluid post aspiration. IMPRESSION: 1. Technically successful ultrasound-guided aspiration, anterior left thigh collection. Electronically Signed   By: Lucrezia Europe M.D.   On: 05/10/2019 08:27     Labs:   Basic Metabolic Panel: Recent Labs  Lab 05/09/19 0432 05/09/19 0432 05/11/19 1200 05/11/19 1200 05/12/19 1244 05/12/19 1244 05/13/19 0300 05/14/19 0451  NA 133*  --  134*  --  131*  --  133* 134*  K 3.9   < > 4.4   < > 4.5   < > 4.7 4.5  CL 102  --  102  --  102  --  104 103  CO2 22  --  24  --  21*  --  22 24  GLUCOSE 52*  --  133*  --  212*  --  171* 104*  BUN 39*  --  36*  --  31*  --  31* 27*  CREATININE 1.67*  --  1.53*  --  1.25*  --  1.24 1.35*  CALCIUM 10.5*  --  10.2  --  9.3  --  9.8 9.7  MG  --   --   --   --  1.6*  --  1.8  --   PHOS  --   --   --   --  3.0  --   --   --    < > = values in this interval not displayed.   GFR Estimated Creatinine Clearance: 34.8 mL/min (A) (by C-G formula based on SCr of 1.35 mg/dL (H)). Liver Function Tests: Recent Labs  Lab 05/09/19 0432 05/12/19 1244  AST 20 24  ALT 16 15  ALKPHOS 28* 29*  BILITOT 0.3 0.6  PROT 5.1* 5.1*  ALBUMIN 2.0* 2.1*   No results for input(s): LIPASE, AMYLASE in the last 168 hours. No results for input(s): AMMONIA in the last 168 hours. Coagulation profile Recent Labs  Lab 05/09/19 0432  INR 1.4*    CBC: Recent Labs  Lab 05/09/19 0432 05/10/19 1533 05/12/19 0417 05/13/19 0300 05/14/19 0451  WBC 8.1 6.9 6.3 7.9 6.2  NEUTROABS 5.6  --   --   --   --   HGB 8.0* 8.2* 10.1* 9.3* 9.0*  HCT 25.7* 26.7* 32.4* 29.5* 28.3*  MCV 84.3 84.8 85.3 86.3 86.3   PLT 433* 383 307 295 260   Cardiac Enzymes: No results for input(s): CKTOTAL, CKMB, CKMBINDEX, TROPONINI in the last 168 hours. BNP: Invalid input(s): POCBNP CBG: Recent Labs  Lab 05/13/19 0750 05/13/19 1202 05/13/19 1807 05/13/19 2121 05/14/19 0734  GLUCAP 142* 172* 179* 216* 121*   D-Dimer No results for input(s): DDIMER in the last 72 hours. Hgb A1c No results for input(s): HGBA1C in the last 72 hours. Lipid Profile No results for input(s): CHOL, HDL, LDLCALC, TRIG, CHOLHDL, LDLDIRECT in the last 72 hours. Thyroid function studies No results for input(s): TSH, T4TOTAL, T3FREE, THYROIDAB in the last 72 hours.  Invalid input(s): FREET3 Anemia work up No results for input(s): VITAMINB12, FOLATE, FERRITIN, TIBC, IRON, RETICCTPCT in the last 72 hours. Microbiology Recent Results (from the past 240 hour(s))  Surgical pcr screen     Status: None   Collection Time: 05/09/19  3:21 AM   Specimen: Nasal Mucosa; Nasal Swab  Result Value Ref Range Status   MRSA, PCR NEGATIVE NEGATIVE Final   Staphylococcus aureus NEGATIVE NEGATIVE Final    Comment: (NOTE) The Xpert SA Assay (FDA approved for NASAL specimens in patients 86 years of age and older), is one component of a comprehensive surveillance program. It is not intended to diagnose infection nor to guide or monitor treatment. Performed at [facility], [place] [address] [place], [place] [address]   Culture, blood (routine x 2)     Status: None (Preliminary result)   Collection Time: 05/09/19  4:33 AM   Specimen: BLOOD  Result Value Ref Range Status   Specimen Description BLOOD LEFT FOREARM  Final   Special Requests   Final    BOTTLES DRAWN AEROBIC AND ANAEROBIC Blood Culture adequate volume Performed at [facility], [place] [address] [place], [place] [address]    Culture NO GROWTH 4 DAYS  Final   Report Status PENDING  Incomplete  Culture, blood (routine x 2)     Status: None (Preliminary result)    Collection Time: 05/09/19  4:43 AM   Specimen: BLOOD  Result Value Ref Range Status   Specimen Description BLOOD LEFT HAND  Final   Special  Requests   Final    BOTTLES DRAWN AEROBIC ONLY Blood Culture adequate volume Performed at [facility] [address], [place], [place] [address]    Culture NO GROWTH 4 DAYS  Final   Report Status PENDING  Incomplete  Aerobic/Anaerobic Culture (surgical/deep wound)     Status: None (Preliminary result)   Collection Time: 05/09/19  5:06 PM   Specimen: Wound; Abscess  Result Value Ref Range Status   Specimen Description WOUND LEFT THIGH US ASPIRATION  Final   Special Requests NONE  Final   Gram Stain   Final    ABUNDANT WBC PRESENT, PREDOMINANTLY PMN NO ORGANISMS SEEN Performed at [facility], [address] [place], [place] [address]    Culture   Final    RARE STREPTOCOCCUS AGALACTIAE TESTING AGAINST S. AGALACTIAE NOT ROUTINELY PERFORMED DUE TO PREDICTABILITY OF AMP/PEN/VAN SUSCEPTIBILITY. NO ANAEROBES ISOLATED; CULTURE IN PROGRESS FOR 5 DAYS    Report Status PENDING  Incomplete  SARS CORONAVIRUS 2 (TAT 6-24 HRS) Nasopharyngeal Nasopharyngeal Swab     Status: None   Collection Time: 05/11/19  2:48 PM   Specimen: Nasopharyngeal Swab  Result Value Ref Range Status   SARS Coronavirus 2 NEGATIVE NEGATIVE Final    Comment: (NOTE) SARS-CoV-2 target nucleic acids are NOT DETECTED. The SARS-CoV-2 RNA is generally detectable in upper and lower respiratory specimens during the acute phase of infection. Negative results do not preclude SARS-CoV-2 infection, do not rule out co-infections with other pathogens, and should not be used as the sole basis for treatment or other patient management decisions. Negative results must be combined with clinical observations, patient history, and epidemiological information. The expected result is Negative. Fact Sheet for Patients: SugarRoll.be Fact Sheet for  Healthcare Providers: https://www.woods-mathews.com/ This test is not yet approved or cleared by the [place] FDA and  has been authorized for detection and/or diagnosis of SARS-CoV-2 by FDA under an Emergency Use Authorization (EUA). This EUA will remain  in effect (meaning this test can be used) for the duration of the COVID-19 declaration under Section 56 4(b)(1) of the Act, 21 U.S.C. section 360bbb-3(b)(1), unless the authorization is terminated or revoked sooner. Performed at [facility], [place] [address] [place], [place] [address]   Surgical pcr screen     Status: None   Collection Time: 05/12/19  8:14 AM   Specimen: Nasal Mucosa; Nasal Swab  Result Value Ref Range Status   MRSA, PCR NEGATIVE NEGATIVE Final   Staphylococcus aureus NEGATIVE NEGATIVE Final    Comment: (NOTE) The Xpert SA Assay (FDA approved for NASAL specimens in patients 82 years of age and older), is one component of a comprehensive surveillance program. It is not intended to diagnose infection nor to guide or monitor treatment. Performed at [facility], [place] [address], [place], [place] [address]      Discharge Instructions:   Discharge Instructions    Call MD for:   Complete by: As directed    Worsening symptoms including increasing hip pain, fever or chills.   Diet Carb Modified   Complete by: As directed    Discharge instructions   Complete by: As directed    Weightbearing as tolerated.  Continue physical therapy with home health.  Continue antibiotics as advised by home health.  Follow-up with orthopedics Dr. Percell Miller in 1 week.  Follow-up with infectious disease Dr. Baxter Flattery in 5 to 6 weeks.   Home infusion instructions   Complete by: As directed  Instructions: Flushing of vascular access device: 0.9% NaCl pre/post medication administration and prn patency; Heparin 100 u/ml, 39m for implanted ports and Heparin 10u/ml, 560mfor all other central venous  catheters.   Increase activity slowly   Complete by: As directed      Allergies as of 05/14/2019      Reactions   Sulfa Antibiotics    unknown      Medication List    STOP taking these medications   aspirin EC 81 MG tablet   cephALEXin 500 MG capsule Commonly known as: KEFLEX     TAKE these medications   baclofen 10 MG tablet Commonly known as: LIORESAL Take 1 tablet (10 mg total) by mouth 2 (two) times daily. What changed:   when to take this  reasons to take this   docusate sodium 100 MG capsule Commonly known as: Colace Take 1 capsule (100 mg total) by mouth 2 (two) times daily. To prevent constipation while taking pain medication.   Fenofibric Acid 135 MG Cpdr Take 135 mg by mouth daily.   finasteride 5 MG tablet Commonly known as: PROSCAR Take 5 mg by mouth daily.   Flomax 0.4 MG Caps capsule Generic drug: tamsulosin Take 0.4 mg by mouth in the morning and at bedtime.   glimepiride 4 MG tablet Commonly known as: AMARYL Take 4 mg by mouth See admin instructions. Prescribed twice daily, patient may take once daily based on his BS level.   HYDROcodone-acetaminophen 5-325 MG tablet Commonly known as: Norco Take 1-2 tablets by mouth every 6 (six) hours as needed for severe pain (Use tylenol for mild and moderate pain.). What changed: reasons to take this   levothyroxine 25 MCG tablet Commonly known as: SYNTHROID Take 25 mcg by mouth daily before breakfast.   lisinopril 2.5 MG tablet Commonly known as: ZESTRIL Take 2.5 mg by mouth daily.   methocarbamol 500 MG tablet Commonly known as: ROBAXIN Take 1 tablet (500 mg total) by mouth every 6 (six) hours as needed for muscle spasms.   metoprolol succinate 25 MG 24 hr tablet Commonly known as: TOPROL-XL Take 25 mg by mouth daily.   multivitamin tablet Take 1 tablet by mouth daily.   ondansetron 4 MG tablet Commonly known as: Zofran Take 1 tablet (4 mg total) by mouth every 8 (eight) hours as needed  for nausea or vomiting.   penicillin G  IVPB Inject 24 Million Units into the vein continuous. 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   sodium bicarbonate 650 MG tablet Take 650 mg by mouth 3 (three) times daily.            Home Infusion Instuctions  (From admission, onward)         Start     Ordered   05/13/19 0000  Home infusion instructions    Question:  Instructions  Answer:  Flushing of vascular access device: 0.9% NaCl pre/post medication administration and prn patency; Heparin 100 u/ml, 13m33mor implanted ports and Heparin 10u/ml, 13ml8mr all other central venous catheters.   05/13/19 1207         Follow-up Information    MurpRenette Butters. Schedule an appointment as soon as  possible for a visit in 10 day(s).   Specialty: Orthopedic Surgery Why: Hip surgery follow-up Contact information: [address] [place] [address] [place] [address] [phone]        Carlyle Basques, MD. Schedule an appointment as soon as possible for a visit in 5 week(s).   Specialty: Infectious Diseases Why: Regarding antibiotics and hip infection Contact information: [place] [address] [phone]        Glenda Chroman, MD. Schedule an appointment as soon as possible for a visit in 1 week(s).   Specialty: Internal Medicine Why: Routine follow-up, blood work Contact information: [place] [address] [phone]           Time coordinating discharge: 39 minutes  Signed:  Olayinka Gathers  [facility] 05/14/2019, 9:00 AM

## 2019-05-14 NOTE — Op Note (Signed)
05/12/2019  8:01 AM  PATIENT:  Casey [name]    PRE-OPERATIVE DIAGNOSIS:   septic hip  POST-OPERATIVE DIAGNOSIS:  Same  PROCEDURE:  irrigation and debridement septic hip headliner exchange  SURGEON:  Renette Butters, MD  ASSISTANT: Nehemiah Massed, PA-C, he was present and scrubbed throughout the case, critical for completion in a timely fashion, and for retraction, instrumentation, and closure.   ANESTHESIA:   gen  PREOPERATIVE INDICATIONS:  Casey [name] is a  86 y.o. male with a diagnosis of  septic hip who failed conservative measures and elected for surgical management.    The risks benefits and alternatives were discussed with the patient preoperatively including but not limited to the risks of infection, bleeding, nerve injury, cardiopulmonary complications, the need for revision surgery, among others, and the patient was willing to proceed.  OPERATIVE IMPLANTS: [employer] THA liner/head exchange  OPERATIVE FINDINGS: purulent fluid  BLOOD LOSS: [id number]  COMPLICATIONS: none  TOURNIQUET TIME: none  OPERATIVE PROCEDURE:  Patient was identified in the preoperative holding area and site was marked by me He was transported to the operating theater and placed on the table in supine position taking care to pad all bony prominences. After a preincinduction time out anesthesia was induced. The left lower extremity was prepped and draped in normal sterile fashion and a pre-incision timeout was performed. He received ancef for preoperative antibiotics.   Prior to the prepping and draping he was placed on the [place]ana bed.  I made an incision through his previous incision for an anterior approach to the hip identified the fascia over the tensor muscle and incised this longitudinally then bluntly developed retracted the muscle laterally and on penetrating the deep fascia purulent fluid was expressed.  He had tracked under the deep portion of the tensor muscle as well.  I then thoroughly  irrigated this in place Irricept.  I removed the polyethylene liner as well as the ceramic ball.  I then performed a repeat irrigation.  I debrided and all devitalized tissue using a rondure.  This went down to joint capsule.  I then placed a new drape and transition changed gloves transitioned all new instruments.  Performed another irrigation replaced Aricept.  I placed a new polyethylene liner followed by -25 ceramic head ball.  I reduced the hip and took an x-ray was happy with the alignment and leg length.  Aricept was then placed again and allowed to sit for a minute before irrigation.  I then closed using monofilament sutures.  Sterile dressing was applied he was awoken taken to PACU in stable condition    POST OPERATIVE PLAN: IV abx, WBAT, mobilize and resume xarelto

## 2019-05-14 NOTE — Progress Notes (Signed)
Pt is discharged to home with PICC line in RUA. All discharge teaching were given to Pt and his family. All questions were answered.

## 2019-05-14 NOTE — TOC Transition Note (Signed)
Transition of Care [facility]) - CM/SW Discharge Note   Patient Details  Name: ERHARD CARAMANICA MRN: [id number] Date of Birth: Apr 19, 1929  [facility] CM/SW Contact:  Lennart Pall, LCSW Phone Number: 05/14/2019, 9:54 AM   Clinical Narrative:   Able to confirm [place] arranged and pt ready to d/c home today.  Daughter has arrived and is in pt's room and she will be primary caregiver.  Carolynn Sayers with [facility] to be here today ~ noon to hook up with home abx.  No further needs.    Final next level of care: [place] Barriers to Discharge: Barriers Resolved   Patient Goals and CMS Choice Patient states their goals for this hospitalization and ongoing recovery are:: to go home! CMS Medicare.gov Compare Post Acute Care list provided to:: Patient Choice offered to / list presented to : Patient  Discharge Placement                       Discharge Plan and Services In-house Referral: Clinical Social Work   Post Acute Care Choice: Home Health          DME Arranged: N/A(has all needed DME)         HH Arranged: RN, IV Antibiotics, PT HH Agency: [place] ([facility]), [facility]) Date HH Agency Contacted: 05/13/19 Time [place]: 1400 Representative spoke with at [place]: Carolynn Sayers wtih [facility];  [place] with [facility]  Social Determinants of Health (SDOH) Interventions     Readmission Risk Interventions Readmission Risk Prevention Plan 05/13/2019  Transportation Screening Complete  PCP or Specialist Appt within 5-7 Days Complete  Home Care Screening Complete  Medication Review (RN CM) Complete  Some recent data might be hidden

## 2019-05-14 NOTE — Plan of Care (Signed)
  Problem: Education: Goal: Knowledge of General Education information will improve Description: Including pain rating scale, medication(s)/side effects and non-pharmacologic comfort measures Outcome: Completed/Met   Problem: Health Behavior/Discharge Planning: Goal: Ability to manage health-related needs will improve Outcome: Completed/Met   Problem: Clinical Measurements: Goal: Ability to maintain clinical measurements within normal limits will improve Outcome: Completed/Met   Problem: Clinical Measurements: Goal: Will remain free from infection Outcome: Completed/Met

## 2019-05-14 NOTE — Progress Notes (Signed)
Physical Therapy Treatment Patient Details Name: Casey [name] MRN: [id number] DOB: [birth year] Today's Date: 05/14/2019    History of Present Illness Casey [name] is an 86 y.o. male with history of diabetes mellitus, hypothyroidism, hypertension, chronic kidney disease stage III, anemia has had a hip surgery with replacement in November 2020. In February 2021 patient has had a strokelike symptom and was found to have an LV thrombus and was placed on apixaban.  Patient states since then patient has been having some hip pain.  Over the last few weeks the hip pain has worsened and patient start developing fever and chills over the last 5 days PTA. Patient has been taking antibiotics.  Patient was admitted to [facility] on 05/09/19 for concerns of septic hip arthritis and  infection and possible Lt thigh abcess. Pt is now s/p I&D to Lt hip on 05/12/19.    PT Comments    POD # 2 Pt feeling better and doing well.  General Gait Details: pt jsu amb from the bathroom ans is doing very well so Tx session focused on HEP. Perform all surgical Hip  TE's following HEP handout.  Instructed on proper tech, freq as well as use of ICE.  Addressed all mobility questions, discussed appropriate activity, educated on use of ICE.  Pt ready for D/C to home.    Follow Up Recommendations  Home health PT;Follow surgeon's recommendation for DC plan and follow-up therapies     Equipment Recommendations  None recommended by PT    Recommendations for Other Services       Precautions / Restrictions Precautions Precautions: Fall Precaution Comments: pt reports several falls in last 6 months with most recent this past Saturday Restrictions Weight Bearing Restrictions: No Other Position/Activity Restrictions: WBAT    Mobility  Bed Mobility Overal bed mobility: Needs Assistance Bed Mobility: Supine to Sit;Sit to Supine     Supine to sit: Supervision Sit to supine: Supervision;Min guard   General bed mobility  comments: demonstarted and instructed how to use a belt to self assist LE  Transfers Overall transfer level: Needs assistance Equipment used: Rolling walker (2 wheeled) Transfers: Sit to/from Stand           General transfer comment: <25% VC's on proper hand placement and increased time as well as safety with turns  Ambulation/Gait             General Gait Details: pt jsu amb from the bathroom ans is doing very well so Tx session focused on HEP.   Stairs             Wheelchair Mobility    Modified Rankin (Stroke Patients Only)       Balance                                            Cognition Arousal/Alertness: Awake/alert Behavior During Therapy: WFL for tasks assessed/performed Overall Cognitive Status: Within Functional Limits for tasks assessed                                 General Comments: pleasant and motivated      Exercises  performed 10 reps all TE's following Surgical Hip TE's handout    General Comments        Pertinent Vitals/Pain Pain Assessment: 0-10 Pain Score: 3  Pain Location: L hip Pain Descriptors / Indicators: Grimacing;Discomfort;Aching;Operative site guarding Pain Intervention(s): Monitored during session;Premedicated before session;Repositioned;Ice applied    Home Living                      Prior Function            PT Goals (current goals can now be found in the care plan section) Progress towards PT goals: Progressing toward goals    Frequency    Min 5X/week      PT Plan Current plan remains appropriate    Co-evaluation              AM-PAC PT "6 Clicks" Mobility   Outcome Measure  Help needed turning from your back to your side while in a flat bed without using bedrails?: None Help needed moving from lying on your back to sitting on the side of a flat bed without using bedrails?: None Help needed moving to and from a bed to a chair (including a  wheelchair)?: None Help needed standing up from a chair using your arms (e.g., wheelchair or bedside chair)?: None Help needed to walk in hospital room?: None Help needed climbing 3-5 steps with a railing? : A Little 6 Click Score: 23    End of Session Equipment Utilized During Treatment: Gait belt Activity Tolerance: Patient tolerated treatment well Patient left: in bed;with bed alarm set;with call bell/phone within reach;with family/visitor present Nurse Communication: Mobility status(pt has met PT goals for a safe D/C today) PT Visit Diagnosis: History of falling (Z91.81);Muscle weakness (generalized) (M62.81);Difficulty in walking, not elsewhere classified (R26.2)     Time: 1104-1130 PT Time Calculation (min) (ACUTE ONLY): 26 min  Charges:  $Therapeutic Exercise: 23-37 mins                     {Faraaz Wolin  PTA Acute  Rehabilitation Services Pager      [phone] Office      [phone]

## 2019-05-15 ENCOUNTER — Telehealth: Payer: Self-pay

## 2019-05-15 DIAGNOSIS — Z7984 Long term (current) use of oral hypoglycemic drugs: Secondary | ICD-10-CM | POA: Diagnosis not present

## 2019-05-15 DIAGNOSIS — I129 Hypertensive chronic kidney disease with stage 1 through stage 4 chronic kidney disease, or unspecified chronic kidney disease: Secondary | ICD-10-CM | POA: Diagnosis not present

## 2019-05-15 DIAGNOSIS — N183 Chronic kidney disease, stage 3 unspecified: Secondary | ICD-10-CM | POA: Diagnosis not present

## 2019-05-15 DIAGNOSIS — I513 Intracardiac thrombosis, not elsewhere classified: Secondary | ICD-10-CM | POA: Diagnosis not present

## 2019-05-15 DIAGNOSIS — T8452XA Infection and inflammatory reaction due to internal left hip prosthesis, initial encounter: Secondary | ICD-10-CM | POA: Diagnosis not present

## 2019-05-15 DIAGNOSIS — E119 Type 2 diabetes mellitus without complications: Secondary | ICD-10-CM | POA: Diagnosis not present

## 2019-05-15 DIAGNOSIS — D631 Anemia in chronic kidney disease: Secondary | ICD-10-CM | POA: Diagnosis not present

## 2019-05-15 DIAGNOSIS — M009 Pyogenic arthritis, unspecified: Secondary | ICD-10-CM | POA: Diagnosis not present

## 2019-05-15 DIAGNOSIS — R262 Difficulty in walking, not elsewhere classified: Secondary | ICD-10-CM | POA: Diagnosis not present

## 2019-05-15 DIAGNOSIS — B951 Streptococcus, group B, as the cause of diseases classified elsewhere: Secondary | ICD-10-CM | POA: Diagnosis not present

## 2019-05-15 LAB — TYPE AND SCREEN
ABO/RH(D): A POS
Antibody Screen: NEGATIVE
Unit division: 0
Unit division: 0
Unit division: 0
Unit division: 0

## 2019-05-15 LAB — BPAM RBC
Blood Product Expiration Date: 202104232359
Blood Product Expiration Date: 202104232359
Blood Product Expiration Date: 202104282359
Blood Product Expiration Date: 202104282359
ISSUE DATE / TIME: 202104121221
ISSUE DATE / TIME: 202104121703
Unit Type and Rh: 6200
Unit Type and Rh: 6200
Unit Type and Rh: 6200
Unit Type and Rh: 6200

## 2019-05-15 LAB — AEROBIC/ANAEROBIC CULTURE W GRAM STAIN (SURGICAL/DEEP WOUND)

## 2019-05-15 NOTE — Telephone Encounter (Signed)
Called patient to set a hospital follow in 4 week with Dr. Baxter Flattery, Patients voice mail is full. Will try again later.

## 2019-05-16 DIAGNOSIS — T8452XA Infection and inflammatory reaction due to internal left hip prosthesis, initial encounter: Secondary | ICD-10-CM | POA: Diagnosis not present

## 2019-05-17 DIAGNOSIS — T8452XA Infection and inflammatory reaction due to internal left hip prosthesis, initial encounter: Secondary | ICD-10-CM | POA: Diagnosis not present

## 2019-05-18 DIAGNOSIS — E119 Type 2 diabetes mellitus without complications: Secondary | ICD-10-CM | POA: Diagnosis not present

## 2019-05-18 DIAGNOSIS — T8452XA Infection and inflammatory reaction due to internal left hip prosthesis, initial encounter: Secondary | ICD-10-CM | POA: Diagnosis not present

## 2019-05-18 DIAGNOSIS — I129 Hypertensive chronic kidney disease with stage 1 through stage 4 chronic kidney disease, or unspecified chronic kidney disease: Secondary | ICD-10-CM | POA: Diagnosis not present

## 2019-05-18 DIAGNOSIS — N183 Chronic kidney disease, stage 3 unspecified: Secondary | ICD-10-CM | POA: Diagnosis not present

## 2019-05-18 DIAGNOSIS — R262 Difficulty in walking, not elsewhere classified: Secondary | ICD-10-CM | POA: Diagnosis not present

## 2019-05-18 DIAGNOSIS — D631 Anemia in chronic kidney disease: Secondary | ICD-10-CM | POA: Diagnosis not present

## 2019-05-18 DIAGNOSIS — Z7984 Long term (current) use of oral hypoglycemic drugs: Secondary | ICD-10-CM | POA: Diagnosis not present

## 2019-05-18 DIAGNOSIS — Z5181 Encounter for therapeutic drug level monitoring: Secondary | ICD-10-CM | POA: Diagnosis not present

## 2019-05-18 DIAGNOSIS — I513 Intracardiac thrombosis, not elsewhere classified: Secondary | ICD-10-CM | POA: Diagnosis not present

## 2019-05-18 DIAGNOSIS — B951 Streptococcus, group B, as the cause of diseases classified elsewhere: Secondary | ICD-10-CM | POA: Diagnosis not present

## 2019-05-18 DIAGNOSIS — M009 Pyogenic arthritis, unspecified: Secondary | ICD-10-CM | POA: Diagnosis not present

## 2019-05-19 DIAGNOSIS — T8452XA Infection and inflammatory reaction due to internal left hip prosthesis, initial encounter: Secondary | ICD-10-CM | POA: Diagnosis not present

## 2019-05-20 DIAGNOSIS — R262 Difficulty in walking, not elsewhere classified: Secondary | ICD-10-CM | POA: Diagnosis not present

## 2019-05-20 DIAGNOSIS — D631 Anemia in chronic kidney disease: Secondary | ICD-10-CM | POA: Diagnosis not present

## 2019-05-20 DIAGNOSIS — Z7984 Long term (current) use of oral hypoglycemic drugs: Secondary | ICD-10-CM | POA: Diagnosis not present

## 2019-05-20 DIAGNOSIS — B951 Streptococcus, group B, as the cause of diseases classified elsewhere: Secondary | ICD-10-CM | POA: Diagnosis not present

## 2019-05-20 DIAGNOSIS — E119 Type 2 diabetes mellitus without complications: Secondary | ICD-10-CM | POA: Diagnosis not present

## 2019-05-20 DIAGNOSIS — N183 Chronic kidney disease, stage 3 unspecified: Secondary | ICD-10-CM | POA: Diagnosis not present

## 2019-05-20 DIAGNOSIS — I129 Hypertensive chronic kidney disease with stage 1 through stage 4 chronic kidney disease, or unspecified chronic kidney disease: Secondary | ICD-10-CM | POA: Diagnosis not present

## 2019-05-20 DIAGNOSIS — I513 Intracardiac thrombosis, not elsewhere classified: Secondary | ICD-10-CM | POA: Diagnosis not present

## 2019-05-20 DIAGNOSIS — M009 Pyogenic arthritis, unspecified: Secondary | ICD-10-CM | POA: Diagnosis not present

## 2019-05-20 DIAGNOSIS — T8452XA Infection and inflammatory reaction due to internal left hip prosthesis, initial encounter: Secondary | ICD-10-CM | POA: Diagnosis not present

## 2019-05-21 DIAGNOSIS — E119 Type 2 diabetes mellitus without complications: Secondary | ICD-10-CM | POA: Diagnosis not present

## 2019-05-21 DIAGNOSIS — Z7984 Long term (current) use of oral hypoglycemic drugs: Secondary | ICD-10-CM | POA: Diagnosis not present

## 2019-05-21 DIAGNOSIS — T8452XA Infection and inflammatory reaction due to internal left hip prosthesis, initial encounter: Secondary | ICD-10-CM | POA: Diagnosis not present

## 2019-05-21 DIAGNOSIS — R262 Difficulty in walking, not elsewhere classified: Secondary | ICD-10-CM | POA: Diagnosis not present

## 2019-05-21 DIAGNOSIS — M009 Pyogenic arthritis, unspecified: Secondary | ICD-10-CM | POA: Diagnosis not present

## 2019-05-21 DIAGNOSIS — I513 Intracardiac thrombosis, not elsewhere classified: Secondary | ICD-10-CM | POA: Diagnosis not present

## 2019-05-21 DIAGNOSIS — D631 Anemia in chronic kidney disease: Secondary | ICD-10-CM | POA: Diagnosis not present

## 2019-05-21 DIAGNOSIS — I129 Hypertensive chronic kidney disease with stage 1 through stage 4 chronic kidney disease, or unspecified chronic kidney disease: Secondary | ICD-10-CM | POA: Diagnosis not present

## 2019-05-21 DIAGNOSIS — B951 Streptococcus, group B, as the cause of diseases classified elsewhere: Secondary | ICD-10-CM | POA: Diagnosis not present

## 2019-05-21 DIAGNOSIS — N183 Chronic kidney disease, stage 3 unspecified: Secondary | ICD-10-CM | POA: Diagnosis not present

## 2019-05-22 DIAGNOSIS — Z7984 Long term (current) use of oral hypoglycemic drugs: Secondary | ICD-10-CM | POA: Diagnosis not present

## 2019-05-22 DIAGNOSIS — T8452XA Infection and inflammatory reaction due to internal left hip prosthesis, initial encounter: Secondary | ICD-10-CM | POA: Diagnosis not present

## 2019-05-22 DIAGNOSIS — D631 Anemia in chronic kidney disease: Secondary | ICD-10-CM | POA: Diagnosis not present

## 2019-05-22 DIAGNOSIS — M009 Pyogenic arthritis, unspecified: Secondary | ICD-10-CM | POA: Diagnosis not present

## 2019-05-22 DIAGNOSIS — E119 Type 2 diabetes mellitus without complications: Secondary | ICD-10-CM | POA: Diagnosis not present

## 2019-05-22 DIAGNOSIS — I129 Hypertensive chronic kidney disease with stage 1 through stage 4 chronic kidney disease, or unspecified chronic kidney disease: Secondary | ICD-10-CM | POA: Diagnosis not present

## 2019-05-22 DIAGNOSIS — R262 Difficulty in walking, not elsewhere classified: Secondary | ICD-10-CM | POA: Diagnosis not present

## 2019-05-22 DIAGNOSIS — B951 Streptococcus, group B, as the cause of diseases classified elsewhere: Secondary | ICD-10-CM | POA: Diagnosis not present

## 2019-05-22 DIAGNOSIS — I513 Intracardiac thrombosis, not elsewhere classified: Secondary | ICD-10-CM | POA: Diagnosis not present

## 2019-05-22 DIAGNOSIS — N183 Chronic kidney disease, stage 3 unspecified: Secondary | ICD-10-CM | POA: Diagnosis not present

## 2019-05-23 DIAGNOSIS — T8452XA Infection and inflammatory reaction due to internal left hip prosthesis, initial encounter: Secondary | ICD-10-CM | POA: Diagnosis not present

## 2019-05-23 DIAGNOSIS — M009 Pyogenic arthritis, unspecified: Secondary | ICD-10-CM | POA: Diagnosis not present

## 2019-05-24 DIAGNOSIS — M009 Pyogenic arthritis, unspecified: Secondary | ICD-10-CM | POA: Diagnosis not present

## 2019-05-24 DIAGNOSIS — T8452XA Infection and inflammatory reaction due to internal left hip prosthesis, initial encounter: Secondary | ICD-10-CM | POA: Diagnosis not present

## 2019-05-25 DIAGNOSIS — Z7984 Long term (current) use of oral hypoglycemic drugs: Secondary | ICD-10-CM | POA: Diagnosis not present

## 2019-05-25 DIAGNOSIS — N183 Chronic kidney disease, stage 3 unspecified: Secondary | ICD-10-CM | POA: Diagnosis not present

## 2019-05-25 DIAGNOSIS — Z5181 Encounter for therapeutic drug level monitoring: Secondary | ICD-10-CM | POA: Diagnosis not present

## 2019-05-25 DIAGNOSIS — M009 Pyogenic arthritis, unspecified: Secondary | ICD-10-CM | POA: Diagnosis not present

## 2019-05-25 DIAGNOSIS — R262 Difficulty in walking, not elsewhere classified: Secondary | ICD-10-CM | POA: Diagnosis not present

## 2019-05-25 DIAGNOSIS — E119 Type 2 diabetes mellitus without complications: Secondary | ICD-10-CM | POA: Diagnosis not present

## 2019-05-25 DIAGNOSIS — I129 Hypertensive chronic kidney disease with stage 1 through stage 4 chronic kidney disease, or unspecified chronic kidney disease: Secondary | ICD-10-CM | POA: Diagnosis not present

## 2019-05-25 DIAGNOSIS — T8452XA Infection and inflammatory reaction due to internal left hip prosthesis, initial encounter: Secondary | ICD-10-CM | POA: Diagnosis not present

## 2019-05-25 DIAGNOSIS — D631 Anemia in chronic kidney disease: Secondary | ICD-10-CM | POA: Diagnosis not present

## 2019-05-25 DIAGNOSIS — M25552 Pain in left hip: Secondary | ICD-10-CM | POA: Diagnosis not present

## 2019-05-25 DIAGNOSIS — I513 Intracardiac thrombosis, not elsewhere classified: Secondary | ICD-10-CM | POA: Diagnosis not present

## 2019-05-25 DIAGNOSIS — B951 Streptococcus, group B, as the cause of diseases classified elsewhere: Secondary | ICD-10-CM | POA: Diagnosis not present

## 2019-05-26 DIAGNOSIS — I129 Hypertensive chronic kidney disease with stage 1 through stage 4 chronic kidney disease, or unspecified chronic kidney disease: Secondary | ICD-10-CM | POA: Diagnosis not present

## 2019-05-26 DIAGNOSIS — D631 Anemia in chronic kidney disease: Secondary | ICD-10-CM | POA: Diagnosis not present

## 2019-05-26 DIAGNOSIS — W19XXXA Unspecified fall, initial encounter: Secondary | ICD-10-CM | POA: Diagnosis not present

## 2019-05-26 DIAGNOSIS — Z7984 Long term (current) use of oral hypoglycemic drugs: Secondary | ICD-10-CM | POA: Diagnosis not present

## 2019-05-26 DIAGNOSIS — N183 Chronic kidney disease, stage 3 unspecified: Secondary | ICD-10-CM | POA: Diagnosis not present

## 2019-05-26 DIAGNOSIS — B951 Streptococcus, group B, as the cause of diseases classified elsewhere: Secondary | ICD-10-CM | POA: Diagnosis not present

## 2019-05-26 DIAGNOSIS — E119 Type 2 diabetes mellitus without complications: Secondary | ICD-10-CM | POA: Diagnosis not present

## 2019-05-26 DIAGNOSIS — E162 Hypoglycemia, unspecified: Secondary | ICD-10-CM | POA: Diagnosis not present

## 2019-05-26 DIAGNOSIS — R262 Difficulty in walking, not elsewhere classified: Secondary | ICD-10-CM | POA: Diagnosis not present

## 2019-05-26 DIAGNOSIS — T8452XA Infection and inflammatory reaction due to internal left hip prosthesis, initial encounter: Secondary | ICD-10-CM | POA: Diagnosis not present

## 2019-05-26 DIAGNOSIS — E161 Other hypoglycemia: Secondary | ICD-10-CM | POA: Diagnosis not present

## 2019-05-26 DIAGNOSIS — I513 Intracardiac thrombosis, not elsewhere classified: Secondary | ICD-10-CM | POA: Diagnosis not present

## 2019-05-26 DIAGNOSIS — M009 Pyogenic arthritis, unspecified: Secondary | ICD-10-CM | POA: Diagnosis not present

## 2019-05-27 ENCOUNTER — Telehealth: Payer: Self-pay

## 2019-05-27 DIAGNOSIS — T8452XA Infection and inflammatory reaction due to internal left hip prosthesis, initial encounter: Secondary | ICD-10-CM | POA: Diagnosis not present

## 2019-05-27 DIAGNOSIS — E119 Type 2 diabetes mellitus without complications: Secondary | ICD-10-CM | POA: Diagnosis not present

## 2019-05-27 DIAGNOSIS — D631 Anemia in chronic kidney disease: Secondary | ICD-10-CM | POA: Diagnosis not present

## 2019-05-27 DIAGNOSIS — R262 Difficulty in walking, not elsewhere classified: Secondary | ICD-10-CM | POA: Diagnosis not present

## 2019-05-27 DIAGNOSIS — E872 Acidosis: Secondary | ICD-10-CM | POA: Diagnosis not present

## 2019-05-27 DIAGNOSIS — I513 Intracardiac thrombosis, not elsewhere classified: Secondary | ICD-10-CM | POA: Diagnosis not present

## 2019-05-27 DIAGNOSIS — E559 Vitamin D deficiency, unspecified: Secondary | ICD-10-CM | POA: Diagnosis not present

## 2019-05-27 DIAGNOSIS — N183 Chronic kidney disease, stage 3 unspecified: Secondary | ICD-10-CM | POA: Diagnosis not present

## 2019-05-27 DIAGNOSIS — M009 Pyogenic arthritis, unspecified: Secondary | ICD-10-CM | POA: Diagnosis not present

## 2019-05-27 DIAGNOSIS — R809 Proteinuria, unspecified: Secondary | ICD-10-CM | POA: Diagnosis not present

## 2019-05-27 DIAGNOSIS — B951 Streptococcus, group B, as the cause of diseases classified elsewhere: Secondary | ICD-10-CM | POA: Diagnosis not present

## 2019-05-27 DIAGNOSIS — N1831 Chronic kidney disease, stage 3a: Secondary | ICD-10-CM | POA: Diagnosis not present

## 2019-05-27 DIAGNOSIS — D638 Anemia in other chronic diseases classified elsewhere: Secondary | ICD-10-CM | POA: Diagnosis not present

## 2019-05-27 DIAGNOSIS — Z7984 Long term (current) use of oral hypoglycemic drugs: Secondary | ICD-10-CM | POA: Diagnosis not present

## 2019-05-27 DIAGNOSIS — I129 Hypertensive chronic kidney disease with stage 1 through stage 4 chronic kidney disease, or unspecified chronic kidney disease: Secondary | ICD-10-CM | POA: Diagnosis not present

## 2019-05-27 NOTE — Telephone Encounter (Signed)
Received calls from daughter, Melody, who expressed concern about her father. Reports multiple days of fevers and uncontrolled blood sugars. Patient is currently on antibiotics but she is concerned that there is something else going on/the infection isn't controlled. Reports he experienced similar symptoms prior to surgery on his hip. He was seen by his renal doctor this morning who recommended going to the hospital for monitoring. RN also recommended the ED for evaluation where ID would be consulted if needed.   Casey [name] Lorita Officer, RN

## 2019-05-28 ENCOUNTER — Other Ambulatory Visit: Payer: Self-pay

## 2019-05-28 ENCOUNTER — Encounter (HOSPITAL_COMMUNITY): Payer: Self-pay | Admitting: Emergency Medicine

## 2019-05-28 ENCOUNTER — Emergency Department (HOSPITAL_COMMUNITY): Payer: Medicare Other

## 2019-05-28 ENCOUNTER — Inpatient Hospital Stay (HOSPITAL_COMMUNITY)
Admission: EM | Admit: 2019-05-28 | Discharge: 2019-06-04 | DRG: 466 | Disposition: A | Discharge status: Skilled Nursing Facility | Payer: Medicare Other | Attending: Internal Medicine | Admitting: Internal Medicine

## 2019-05-28 DIAGNOSIS — R197 Diarrhea, unspecified: Secondary | ICD-10-CM | POA: Diagnosis present

## 2019-05-28 DIAGNOSIS — I513 Intracardiac thrombosis, not elsewhere classified: Secondary | ICD-10-CM | POA: Diagnosis not present

## 2019-05-28 DIAGNOSIS — Z8673 Personal history of transient ischemic attack (TIA), and cerebral infarction without residual deficits: Secondary | ICD-10-CM | POA: Diagnosis not present

## 2019-05-28 DIAGNOSIS — Z96643 Presence of artificial hip joint, bilateral: Secondary | ICD-10-CM | POA: Diagnosis present

## 2019-05-28 DIAGNOSIS — R338 Other retention of urine: Secondary | ICD-10-CM | POA: Diagnosis not present

## 2019-05-28 DIAGNOSIS — D631 Anemia in chronic kidney disease: Secondary | ICD-10-CM | POA: Diagnosis present

## 2019-05-28 DIAGNOSIS — R310 Gross hematuria: Secondary | ICD-10-CM | POA: Diagnosis not present

## 2019-05-28 DIAGNOSIS — T8383XA Hemorrhage of genitourinary prosthetic devices, implants and grafts, initial encounter: Secondary | ICD-10-CM | POA: Diagnosis not present

## 2019-05-28 DIAGNOSIS — R509 Fever, unspecified: Secondary | ICD-10-CM

## 2019-05-28 DIAGNOSIS — M7989 Other specified soft tissue disorders: Secondary | ICD-10-CM | POA: Diagnosis not present

## 2019-05-28 DIAGNOSIS — Z87891 Personal history of nicotine dependence: Secondary | ICD-10-CM

## 2019-05-28 DIAGNOSIS — Z743 Need for continuous supervision: Secondary | ICD-10-CM | POA: Diagnosis not present

## 2019-05-28 DIAGNOSIS — E871 Hypo-osmolality and hyponatremia: Secondary | ICD-10-CM | POA: Diagnosis present

## 2019-05-28 DIAGNOSIS — Z96649 Presence of unspecified artificial hip joint: Secondary | ICD-10-CM | POA: Diagnosis not present

## 2019-05-28 DIAGNOSIS — D62 Acute posthemorrhagic anemia: Secondary | ICD-10-CM | POA: Diagnosis not present

## 2019-05-28 DIAGNOSIS — Z9842 Cataract extraction status, left eye: Secondary | ICD-10-CM

## 2019-05-28 DIAGNOSIS — N183 Chronic kidney disease, stage 3 unspecified: Secondary | ICD-10-CM | POA: Diagnosis not present

## 2019-05-28 DIAGNOSIS — Z7901 Long term (current) use of anticoagulants: Secondary | ICD-10-CM

## 2019-05-28 DIAGNOSIS — N1832 Chronic kidney disease, stage 3b: Secondary | ICD-10-CM | POA: Diagnosis not present

## 2019-05-28 DIAGNOSIS — T8452XA Infection and inflammatory reaction due to internal left hip prosthesis, initial encounter: Secondary | ICD-10-CM | POA: Diagnosis not present

## 2019-05-28 DIAGNOSIS — N138 Other obstructive and reflux uropathy: Secondary | ICD-10-CM | POA: Diagnosis present

## 2019-05-28 DIAGNOSIS — A419 Sepsis, unspecified organism: Secondary | ICD-10-CM | POA: Diagnosis not present

## 2019-05-28 DIAGNOSIS — Z20822 Contact with and (suspected) exposure to covid-19: Secondary | ICD-10-CM | POA: Diagnosis present

## 2019-05-28 DIAGNOSIS — E039 Hypothyroidism, unspecified: Secondary | ICD-10-CM | POA: Diagnosis present

## 2019-05-28 DIAGNOSIS — I252 Old myocardial infarction: Secondary | ICD-10-CM

## 2019-05-28 DIAGNOSIS — Z79890 Hormone replacement therapy (postmenopausal): Secondary | ICD-10-CM

## 2019-05-28 DIAGNOSIS — Z9841 Cataract extraction status, right eye: Secondary | ICD-10-CM

## 2019-05-28 DIAGNOSIS — I129 Hypertensive chronic kidney disease with stage 1 through stage 4 chronic kidney disease, or unspecified chronic kidney disease: Secondary | ICD-10-CM | POA: Diagnosis not present

## 2019-05-28 DIAGNOSIS — L02416 Cutaneous abscess of left lower limb: Secondary | ICD-10-CM | POA: Diagnosis not present

## 2019-05-28 DIAGNOSIS — Z79891 Long term (current) use of opiate analgesic: Secondary | ICD-10-CM

## 2019-05-28 DIAGNOSIS — T8452XD Infection and inflammatory reaction due to internal left hip prosthesis, subsequent encounter: Secondary | ICD-10-CM | POA: Diagnosis not present

## 2019-05-28 DIAGNOSIS — N401 Enlarged prostate with lower urinary tract symptoms: Secondary | ICD-10-CM | POA: Diagnosis present

## 2019-05-28 DIAGNOSIS — N400 Enlarged prostate without lower urinary tract symptoms: Secondary | ICD-10-CM | POA: Diagnosis not present

## 2019-05-28 DIAGNOSIS — R5381 Other malaise: Secondary | ICD-10-CM | POA: Diagnosis not present

## 2019-05-28 DIAGNOSIS — R000 Tachycardia, unspecified: Secondary | ICD-10-CM | POA: Diagnosis not present

## 2019-05-28 DIAGNOSIS — Z7401 Bed confinement status: Secondary | ICD-10-CM | POA: Diagnosis not present

## 2019-05-28 DIAGNOSIS — Y831 Surgical operation with implant of artificial internal device as the cause of abnormal reaction of the patient, or of later complication, without mention of misadventure at the time of the procedure: Secondary | ICD-10-CM | POA: Diagnosis present

## 2019-05-28 DIAGNOSIS — M6281 Muscle weakness (generalized): Secondary | ICD-10-CM | POA: Diagnosis not present

## 2019-05-28 DIAGNOSIS — E118 Type 2 diabetes mellitus with unspecified complications: Secondary | ICD-10-CM

## 2019-05-28 DIAGNOSIS — N179 Acute kidney failure, unspecified: Secondary | ICD-10-CM | POA: Diagnosis not present

## 2019-05-28 DIAGNOSIS — Z79899 Other long term (current) drug therapy: Secondary | ICD-10-CM

## 2019-05-28 DIAGNOSIS — Y792 Prosthetic and other implants, materials and accessory orthopedic devices associated with adverse incidents: Secondary | ICD-10-CM | POA: Diagnosis present

## 2019-05-28 DIAGNOSIS — E1122 Type 2 diabetes mellitus with diabetic chronic kidney disease: Secondary | ICD-10-CM | POA: Diagnosis present

## 2019-05-28 DIAGNOSIS — E11649 Type 2 diabetes mellitus with hypoglycemia without coma: Secondary | ICD-10-CM | POA: Diagnosis present

## 2019-05-28 DIAGNOSIS — A401 Sepsis due to streptococcus, group B: Secondary | ICD-10-CM | POA: Diagnosis present

## 2019-05-28 DIAGNOSIS — Z7984 Long term (current) use of oral hypoglycemic drugs: Secondary | ICD-10-CM

## 2019-05-28 DIAGNOSIS — I4510 Unspecified right bundle-branch block: Secondary | ICD-10-CM | POA: Diagnosis present

## 2019-05-28 DIAGNOSIS — Z8551 Personal history of malignant neoplasm of bladder: Secondary | ICD-10-CM | POA: Diagnosis not present

## 2019-05-28 DIAGNOSIS — Y846 Urinary catheterization as the cause of abnormal reaction of the patient, or of later complication, without mention of misadventure at the time of the procedure: Secondary | ICD-10-CM | POA: Diagnosis not present

## 2019-05-28 DIAGNOSIS — Z9049 Acquired absence of other specified parts of digestive tract: Secondary | ICD-10-CM

## 2019-05-28 DIAGNOSIS — M1990 Unspecified osteoarthritis, unspecified site: Secondary | ICD-10-CM | POA: Diagnosis not present

## 2019-05-28 DIAGNOSIS — M009 Pyogenic arthritis, unspecified: Secondary | ICD-10-CM | POA: Diagnosis not present

## 2019-05-28 DIAGNOSIS — M2550 Pain in unspecified joint: Secondary | ICD-10-CM | POA: Diagnosis not present

## 2019-05-28 DIAGNOSIS — R5081 Fever presenting with conditions classified elsewhere: Secondary | ICD-10-CM

## 2019-05-28 DIAGNOSIS — Z86718 Personal history of other venous thrombosis and embolism: Secondary | ICD-10-CM

## 2019-05-28 DIAGNOSIS — Z96642 Presence of left artificial hip joint: Secondary | ICD-10-CM | POA: Diagnosis not present

## 2019-05-28 DIAGNOSIS — M109 Gout, unspecified: Secondary | ICD-10-CM | POA: Diagnosis present

## 2019-05-28 DIAGNOSIS — Z882 Allergy status to sulfonamides status: Secondary | ICD-10-CM

## 2019-05-28 DIAGNOSIS — Z9889 Other specified postprocedural states: Secondary | ICD-10-CM | POA: Diagnosis not present

## 2019-05-28 DIAGNOSIS — Z8249 Family history of ischemic heart disease and other diseases of the circulatory system: Secondary | ICD-10-CM

## 2019-05-28 DIAGNOSIS — Z961 Presence of intraocular lens: Secondary | ICD-10-CM | POA: Diagnosis present

## 2019-05-28 DIAGNOSIS — Z471 Aftercare following joint replacement surgery: Secondary | ICD-10-CM | POA: Diagnosis not present

## 2019-05-28 DIAGNOSIS — I2510 Atherosclerotic heart disease of native coronary artery without angina pectoris: Secondary | ICD-10-CM | POA: Diagnosis not present

## 2019-05-28 DIAGNOSIS — Z9861 Coronary angioplasty status: Secondary | ICD-10-CM

## 2019-05-28 DIAGNOSIS — E7800 Pure hypercholesterolemia, unspecified: Secondary | ICD-10-CM | POA: Diagnosis not present

## 2019-05-28 DIAGNOSIS — R319 Hematuria, unspecified: Secondary | ICD-10-CM | POA: Diagnosis not present

## 2019-05-28 LAB — C-REACTIVE PROTEIN: CRP: 13.2 mg/dL — ABNORMAL HIGH (ref <1.0)

## 2019-05-28 LAB — URINALYSIS, ROUTINE W REFLEX MICROSCOPIC
Bacteria, UA: NONE SEEN
Bilirubin Urine: NEGATIVE
Glucose, UA: NEGATIVE mg/dL
Ketones, ur: NEGATIVE mg/dL
Leukocytes,Ua: NEGATIVE
Nitrite: NEGATIVE
Protein, ur: NEGATIVE mg/dL
RBC / HPF: >50 RBC/hpf — ABNORMAL HIGH (ref 0–5)
Specific Gravity, Urine: 1.012 (ref 1.005–1.030)
pH: 7 (ref 5.0–8.0)

## 2019-05-28 LAB — RESPIRATORY PANEL BY RT PCR (FLU A&B, COVID)
Influenza A by PCR: NEGATIVE
Influenza B by PCR: NEGATIVE
SARS Coronavirus 2 by RT PCR: NEGATIVE

## 2019-05-28 LAB — CBC WITH DIFFERENTIAL/PLATELET
Abs Immature Granulocytes: 0.11 10*3/uL — ABNORMAL HIGH (ref 0.00–0.07)
Basophils Absolute: 0 10*3/uL (ref 0.0–0.1)
Basophils Relative: 1 %
Eosinophils Absolute: 0.4 10*3/uL (ref 0.0–0.5)
Eosinophils Relative: 7 %
HCT: 29.5 % — ABNORMAL LOW (ref 39.0–52.0)
Hemoglobin: 9.1 g/dL — ABNORMAL LOW (ref 13.0–17.0)
Immature Granulocytes: 2 %
Lymphocytes Relative: 24 %
Lymphs Abs: 1.3 10*3/uL (ref 0.7–4.0)
MCH: 26.8 pg (ref 26.0–34.0)
MCHC: 30.8 g/dL (ref 30.0–36.0)
MCV: 86.8 fL (ref 80.0–100.0)
Monocytes Absolute: 0.5 10*3/uL (ref 0.1–1.0)
Monocytes Relative: 10 %
Neutro Abs: 3.1 10*3/uL (ref 1.7–7.7)
Neutrophils Relative %: 56 %
Platelets: 311 10*3/uL (ref 150–400)
RBC: 3.4 MIL/uL — ABNORMAL LOW (ref 4.22–5.81)
RDW: 15.9 % — ABNORMAL HIGH (ref 11.5–15.5)
WBC: 5.4 10*3/uL (ref 4.0–10.5)
nRBC: 0 % (ref 0.0–0.2)

## 2019-05-28 LAB — COMPREHENSIVE METABOLIC PANEL
ALT: 12 U/L (ref 0–44)
AST: 22 U/L (ref 15–41)
Albumin: 2.5 g/dL — ABNORMAL LOW (ref 3.5–5.0)
Alkaline Phosphatase: 33 U/L — ABNORMAL LOW (ref 38–126)
Anion gap: 10 (ref 5–15)
BUN: 30 mg/dL — ABNORMAL HIGH (ref 8–23)
CO2: 21 mmol/L — ABNORMAL LOW (ref 22–32)
Calcium: 10 mg/dL (ref 8.9–10.3)
Chloride: 101 mmol/L (ref 98–111)
Creatinine, Ser: 1.62 mg/dL — ABNORMAL HIGH (ref 0.61–1.24)
GFR calc Af Amer: 44 mL/min — ABNORMAL LOW (ref >60)
GFR calc non Af Amer: 38 mL/min — ABNORMAL LOW (ref >60)
Glucose, Bld: 111 mg/dL — ABNORMAL HIGH (ref 70–99)
Potassium: 4.8 mmol/L (ref 3.5–5.1)
Sodium: 132 mmol/L — ABNORMAL LOW (ref 135–145)
Total Bilirubin: 0.5 mg/dL (ref 0.3–1.2)
Total Protein: 6.3 g/dL — ABNORMAL LOW (ref 6.5–8.1)

## 2019-05-28 LAB — SEDIMENTATION RATE: Sed Rate: 75 mm/hr — ABNORMAL HIGH (ref 0–16)

## 2019-05-28 LAB — PROTIME-INR
INR: 2.1 — ABNORMAL HIGH (ref 0.8–1.2)
Prothrombin Time: 22.6 s — ABNORMAL HIGH (ref 11.4–15.2)

## 2019-05-28 LAB — CBG MONITORING, ED: Glucose-Capillary: 84 mg/dL (ref 70–99)

## 2019-05-28 LAB — LACTIC ACID, PLASMA: Lactic Acid, Venous: 1.9 mmol/L (ref 0.5–1.9)

## 2019-05-28 LAB — APTT: aPTT: 67 s — ABNORMAL HIGH (ref 24–36)

## 2019-05-28 LAB — LIPASE, BLOOD: Lipase: 64 U/L — ABNORMAL HIGH (ref 11–51)

## 2019-05-28 MED ORDER — ACETAMINOPHEN 325 MG PO TABS
650.0000 mg | ORAL_TABLET | Freq: Once | ORAL | Status: AC
  Administered 2019-05-28: 650 mg via ORAL
  Filled 2019-05-28: qty 2

## 2019-05-28 MED ORDER — SODIUM CHLORIDE 0.9 % IV SOLN: INTRAVENOUS | Status: DC

## 2019-05-28 MED ORDER — SENNOSIDES-DOCUSATE SODIUM 8.6-50 MG PO TABS: 1.0000 | ORAL_TABLET | Freq: Every evening | ORAL | Status: DC | PRN

## 2019-05-28 MED ORDER — ONDANSETRON HCL 4 MG/2ML IJ SOLN: 4.0000 mg | Freq: Four times a day (QID) | INTRAMUSCULAR | Status: DC | PRN

## 2019-05-28 MED ORDER — ACETAMINOPHEN 650 MG RE SUPP: 650.0000 mg | Freq: Four times a day (QID) | RECTAL | Status: DC | PRN

## 2019-05-28 MED ORDER — SODIUM CHLORIDE 0.9 % IV BOLUS (SEPSIS)
250.0000 mL | Freq: Once | INTRAVENOUS | Status: AC
  Administered 2019-05-28: 18:00:00 250 mL via INTRAVENOUS

## 2019-05-28 MED ORDER — VANCOMYCIN HCL IN DEXTROSE 1-5 GM/200ML-% IV SOLN
1000.0000 mg | Freq: Once | INTRAVENOUS | Status: AC
  Administered 2019-05-28: 1000 mg via INTRAVENOUS
  Filled 2019-05-28: qty 200

## 2019-05-28 MED ORDER — HEPARIN SODIUM (PORCINE) 5000 UNIT/ML IJ SOLN
5000.0000 [IU] | Freq: Three times a day (TID) | INTRAMUSCULAR | Status: DC
  Administered 2019-05-29 – 2019-05-31 (×6): 5000 [IU] via SUBCUTANEOUS
  Filled 2019-05-28 (×7): qty 1

## 2019-05-28 MED ORDER — SODIUM CHLORIDE 0.9 % IV BOLUS (SEPSIS)
1000.0000 mL | Freq: Once | INTRAVENOUS | Status: AC
  Administered 2019-05-28: 1000 mL via INTRAVENOUS

## 2019-05-28 MED ORDER — ACETAMINOPHEN 325 MG PO TABS
650.0000 mg | ORAL_TABLET | Freq: Four times a day (QID) | ORAL | Status: DC | PRN
  Administered 2019-06-02 – 2019-06-04 (×3): 650 mg via ORAL
  Filled 2019-05-28 (×4): qty 2

## 2019-05-28 MED ORDER — METRONIDAZOLE IN NACL 5-0.79 MG/ML-% IV SOLN
500.0000 mg | Freq: Once | INTRAVENOUS | Status: AC
  Administered 2019-05-28: 500 mg via INTRAVENOUS
  Filled 2019-05-28: qty 100

## 2019-05-28 MED ORDER — SODIUM CHLORIDE 0.9 % IV SOLN
2.0000 g | Freq: Once | INTRAVENOUS | Status: AC
  Administered 2019-05-28: 2 g via INTRAVENOUS
  Filled 2019-05-28: qty 2

## 2019-05-28 MED ORDER — ONDANSETRON HCL 4 MG PO TABS: 4.0000 mg | ORAL_TABLET | Freq: Four times a day (QID) | ORAL | Status: DC | PRN

## 2019-05-28 NOTE — TOC Initial Note (Addendum)
Transition of Care [facility]) - Initial/Assessment Note    Patient Details  Name: Casey [name] MRN: [id number] Date of Birth: Nov 29, 1929  [facility]) CM/SW Contact:    Erenest Rasher, RN Phone Number: [phone] 05/28/2019, 8:18 PM  Clinical Narrative:                 TOC CM spoke to pt's daughter, Casey [name]. Pt was at home with 24 hour caregivers and HH with [facility] for IV abx. Requesting SNF for rehab and IV abx. Will need PT evaluation. Dtr gave permission to complete FL2 and fax out referral to SNF rehab. Instructed pt's dtr to check Medicare.gov to get ratings for different SNF.   05/29/2019 5:30 pm Spoke to pt and son at bedside. Pt states daughter is his HCPOA and states she assist him with making decisions and manages his care at home. Pt is agreeable to SNF rehab, gave permission to complete FL2 and fax out referral to SNF rehab. States he is interested in SNF rehab in [place], possible [employer]. Does not want [facility]. TOC CM/CSW will continue to follow for dc disposition.   Expected Discharge Plan: Skilled Nursing Facility Barriers to Discharge: Continued Medical Work up   Patient Goals and CMS Choice Patient states their goals for this hospitalization and ongoing recovery are:: will need rehab CMS Medicare.gov Compare Post Acute Care list provided to:: Patient Represenative (must comment)(daughter, Casey [name]) Choice offered to / list presented to : Adult Children, HC POA / Guardian  Expected Discharge Plan and Services Expected Discharge Plan: [place] In-house Referral: Clinical Social Work Discharge Planning Services: CM Consult Post Acute Care Choice: [place] arrangements for the past 2 months: [place]                                      Prior Living Arrangements/Services Living arrangements for the past 2 months: Single Family Home Lives with:: Self Patient language  and need for interpreter reviewed:: Yes Do you feel safe going back to the place where you live?: No      Need for Family Participation in Patient Care: Yes (Comment) Care giver support system in place?: Yes (comment) Current home services: DME, Home OT, Home PT, Home RN(rolling walker, bedside commode) Criminal Activity/Legal Involvement Pertinent to Current Situation/Hospitalization: No - Comment as needed  Activities of Daily Living Home Assistive Devices/Equipment: Cane (specify quad or straight) ADL Screening (condition at time of admission) Patient's cognitive ability adequate to safely complete daily activities?: Yes Is the patient deaf or have difficulty hearing?: No Does the patient have difficulty seeing, even when wearing glasses/contacts?: No Does the patient have difficulty concentrating, remembering, or making decisions?: No Patient able to express need for assistance with ADLs?: Yes Does the patient have difficulty dressing or bathing?: Yes Independently performs ADLs?: No Communication: Independent Dressing (OT): Needs assistance Is this a change from baseline?: Pre-admission baseline Grooming: Independent Is this a change from baseline?: Pre-admission baseline Feeding: Independent Bathing: Needs assistance Is this a change from baseline?: Pre-admission baseline Toileting: Needs assistance Is this a change from baseline?: Pre-admission baseline In/Out Bed: Needs assistance Is this a change from baseline?: Pre-admission baseline Walks in Home: Needs assistance Is this a change from baseline?: Pre-admission baseline Does the patient have difficulty walking or climbing stairs?: Yes Weakness of Legs: Left Weakness of Arms/Hands:  None  Permission Sought/Granted Permission sought to share information with : Case Manager, Customer service manager, Family Supports, PCP Permission granted to share information with : Yes, Verbal Permission Granted  Share Information  with NAME: Casey [name]  Permission granted to share info w AGENCY: SNF, [place] granted to share info w Relationship: daughter  Permission granted to share info w Contact Information: [phone]  Emotional Assessment       Orientation: : Oriented to Self, Oriented to Place, Oriented to  Time, Oriented to Situation   Psych Involvement: No (comment)  Admission diagnosis:  fever, high sugar Patient Active Problem List   Diagnosis Date Noted  . Left hip prosthetic joint infection ([place]) 05/12/2019  . Pressure injury of skin 05/11/2019  . Septic arthritis ([place]) 05/09/2019  . LV (left ventricular) mural thrombus 05/09/2019  . CKD (chronic kidney disease), stage III 05/09/2019  . Anemia 05/09/2019  . Primary osteoarthritis of left hip 12/03/2018  . Guaiac + stool 06/28/2017  . Diarrhea 08/11/2012  . CAD (coronary artery disease) of artery bypass graft 08/11/2012  . Diabetes ([place]) 08/11/2012  . High cholesterol 08/11/2012   PCP:  Glenda Chroman, MD Pharmacy:   [facility], [place] [id number] [place] [id number] Phone: [phone] Fax: [phone]     Social Determinants of Health (SDOH) Interventions    Readmission Risk Interventions Readmission Risk Prevention Plan 05/13/2019  Transportation Screening Complete  PCP or Specialist Appt within 5-7 Days Complete  Home Care Screening Complete  Medication Review (RN CM) Complete  Some recent data might be hidden

## 2019-05-28 NOTE — Progress Notes (Signed)
A consult was received from an ED physician for vanc/cefepime per pharmacy dosing.  The patient's profile has been reviewed for ht/wt/allergies/indication/available labs.   A one time order has been placed for vanc 1g and cefepime 2g.  Further antibiotics/pharmacy consults should be ordered by admitting physician if indicated.                       Thank you, Kara Mead 05/28/2019  2:46 PM

## 2019-05-28 NOTE — Progress Notes (Signed)
Pharmacy Antibiotic Note  Casey [name] is a 87 y.o. male admitted on 05/28/2019 with wound infection.  Pharmacy has been consulted for cefepime and vancomycin dosing.  Plan: Cefepime 2 Gm IV q24h Vancomycin 1 Gm x1 then 750 mg IV q24h for est AUC = 497 Goal AUC = 400-550 Use scr = 1.62 F/u scr/cultures     Temp (24hrs), Avg:99.5 F (37.5 C), Min:97.9 F (36.6 C), Max:100.9 F (38.3 C)  Recent Labs  Lab 05/28/19 1300  WBC 5.4  CREATININE 1.62*  LATICACIDVEN 1.9    CrCl cannot be calculated (Unknown ideal weight.).    Allergies  Allergen Reactions  . Sulfa Antibiotics     unknown    Antimicrobials this admission: 4/29 cefepime >>  4/29 vancomycin >>   Dose adjustments this admission:   Microbiology results:  BCx:   UCx:    Sputum:    MRSA PCR:   Thank you for allowing pharmacy to be a part of this patient's care.  Dorrene German 05/28/2019 10:57 PM

## 2019-05-28 NOTE — ED Provider Notes (Signed)
[facility] DEPT Provider Note   CSN: [id number] Arrival date & time: 05/28/19  1100     History Chief Complaint  Patient presents with  . Hypoglycemia  . Fever    Casey [name] is a 86 y.o. male.  The history is provided by the patient and medical records. No language interpreter was used.  Hypoglycemia Fever    86 year old male with history of infected left hip prosthetic joint currently receiving penicillin infusion, bladder cancer, CKD, DM, CAD brought in by girlfriend for evaluation of low blood sugar.  Patient report he had a hip replacement in November 2020.  Last month, he developed pain in his left hip and also developed fever and chills with subsequent admission to the hospital for an infected left hip prosthetic.  Patient has aspiration of his hip consistent with septic arthritis follows by irrigation debridement of the wound on 4/13.  He had a PICC line placed, discharged home with penicillin.  He is currently on Xarelto.  Since being home, patient report intermittent episodes of fever as high as 102.9, as well as having chills, and having body tremors.  He has had several episodes in which his blood sugar was low.  He does take oral medication for his diabetes which include glimepiride but states he has not been taking it for the past few days.  This morning, patient felt very tremulous, CBG was 29, and he also report a fever of 102.1.  He has been eating and drinking and his blood sugar did improved.  He reach out to his doctor in regard to his symptoms and was recommended to come to the ER for further evaluation.  He has had his Covid-19 vaccination.  He denies any runny nose sneezing or coughing, aside from occasional nonproductive cough.  He does endorse some mild loose stools on occasion but denies nausea or vomiting.  He has been taking Tylenol for his fever last use was this morning.   Past Medical History:  Diagnosis Date  . Anemia   .  Arthritis    "all over" all joints  . Bladder cancer (HCC)    bladder  . Blood transfusion 2008  . Chronic kidney disease    bladder ca in 2008;Kid. failure stage 3  . Complication of anesthesia    Ilios post op  . Coronary artery disease   . CVA (cerebral vascular accident) ([place]) 03/04/2019  . Diabetes mellitus   . DJD (degenerative joint disease)   . Guaiac + stool 06/28/2017  . Heart murmur   . Hypothyroidism    hyper thyroidism recently  . Ileus ([place]) 2001   post hip replacement  . Metabolic acidosis   . Metabolic bone disease   . Myocardial infarction [facility]) age 86    Patient Active Problem List   Diagnosis Date Noted  . Left hip prosthetic joint infection ([place]) 05/12/2019  . Pressure injury of skin 05/11/2019  . Septic arthritis ([place]) 05/09/2019  . LV (left ventricular) mural thrombus 05/09/2019  . CKD (chronic kidney disease), stage III 05/09/2019  . Anemia 05/09/2019  . Primary osteoarthritis of left hip 12/03/2018  . Guaiac + stool 06/28/2017  . Diarrhea 08/11/2012  . CAD (coronary artery disease) of artery bypass graft 08/11/2012  . Diabetes ([place]) 08/11/2012  . High cholesterol 08/11/2012    Past Surgical History:  Procedure Laterality Date  . BLADDER SURGERY  2008  . CATARACT EXTRACTION W/ INTRAOCULAR LENS  IMPLANT, BILATERAL Bilateral   . CHOLECYSTECTOMY    .  COLONOSCOPY  09/07/2010   Procedure: COLONOSCOPY;  Surgeon: Rogene Houston, MD;  Location: [facility] SUITE;  Service: Endoscopy;  Laterality: N/A;  . CORONARY ANGIOPLASTY    . INGUINAL HERNIA REPAIR  2005  . TOTAL HIP ARTHROPLASTY Left 12/23/2018   Procedure: TOTAL HIP ARTHROPLASTY ANTERIOR APPROACH;  Surgeon: Renette Butters, MD;  Location: [facility] ORS;  Service: Orthopedics;  Laterality: Left;  . TOTAL HIP ARTHROPLASTY Left 05/12/2019   Procedure: irrigation and debridement septic hip headliner exchange;  Surgeon: Renette Butters, MD;  Location: [facility] ORS;  Service: Orthopedics;  Laterality: Left;  wants  hana table       Family History  Problem Relation Age of Onset  . CAD Mother   . CAD Father     Social History   Tobacco Use  . Smoking status: Former Smoker    Quit date: 09/07/1990    Years since quitting: 28.7  . Smokeless tobacco: Never Used  Substance Use Topics  . Alcohol use: Yes    Comment: occasional  . Drug use: No    Home Medications Prior to Admission medications   Medication Sig Start Date End Date Taking? Authorizing Provider  baclofen (LIORESAL) 10 MG tablet Take 1 tablet (10 mg total) by mouth 2 (two) times daily. 05/12/19   Prudencio Burly III, PA-C  Choline Fenofibrate (FENOFIBRIC ACID) 135 MG CPDR Take 135 mg by mouth daily.     [provider]  docusate sodium (COLACE) 100 MG capsule Take 1 capsule (100 mg total) by mouth 2 (two) times daily. To prevent constipation while taking pain medication. Patient not taking: Reported on 05/11/2019 12/23/18   Prudencio Burly III, PA-C  finasteride (PROSCAR) 5 MG tablet Take 5 mg by mouth daily. 11/18/18   [provider]  glimepiride (AMARYL) 4 MG tablet Take 4 mg by mouth See admin instructions. Prescribed twice daily, patient may take once daily based on his BS level.    [provider]  HYDROcodone-acetaminophen (NORCO) 5-325 MG tablet Take 1-2 tablets by mouth every 6 (six) hours as needed for severe pain (Use tylenol for mild and moderate pain.). 05/12/19   Prudencio Burly III, PA-C  levothyroxine (SYNTHROID) 25 MCG tablet Take 25 mcg by mouth daily before breakfast.    [provider]  lisinopril (ZESTRIL) 2.5 MG tablet Take 2.5 mg by mouth daily.     [provider]  methocarbamol (ROBAXIN) 500 MG tablet Take 1 tablet (500 mg total) by mouth every 6 (six) hours as needed for muscle spasms. 05/14/19   Pokhrel, Corrie Mckusick, MD  metoprolol succinate (TOPROL-XL) 25 MG 24 hr tablet Take 25 mg by mouth daily.     [provider]  Multiple Vitamin  (MULTIVITAMIN) tablet Take 1 tablet by mouth daily.    [provider]  ondansetron (ZOFRAN) 4 MG tablet Take 1 tablet (4 mg total) by mouth every 8 (eight) hours as needed for nausea or vomiting. Patient not taking: Reported on 05/11/2019 12/23/18   Prudencio Burly III, PA-C  penicillin G IVPB Inject 24 Million Units into the vein continuous. 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    [provider]  sodium bicarbonate 650 MG tablet Take 650 mg by mouth 3 (three) times daily.    [provider]  Tamsulosin HCl (FLOMAX) 0.4 MG CAPS Take 0.4 mg by mouth in the morning and at bedtime.     [provider]    Allergies    Sulfa antibiotics  Review of Systems   Review of Systems  Constitutional: Positive for fever.  All other systems reviewed and are negative.   Physical Exam Updated Vital Signs BP (!) 147/73   Pulse 77   Temp 97.9 F (36.6 C) (Oral)   Resp 19   SpO2 100%   Physical Exam Vitals and nursing note reviewed.  Constitutional:      General: He is not in acute distress.    Appearance: He is well-developed.  HENT:     Head: Atraumatic.  Eyes:     Conjunctiva/sclera: Conjunctivae normal.  Cardiovascular:     Rate and Rhythm: Normal rate and regular rhythm.     Pulses: Normal pulses.     Heart sounds: Normal heart sounds.  Pulmonary:     Effort: Pulmonary effort is normal.     Breath sounds: Normal breath sounds.  Abdominal:     Palpations: Abdomen is soft.     Tenderness: There is no abdominal tenderness.  Musculoskeletal:     Cervical back: Neck supple.     Comments: L hip: well appearing anterior surgical scar with sterile  tapes in place without significant tenderness or erythema or warmth.  Nornal hip ROM, sensation intact.  Skin:    Findings: No rash.  Neurological:     Mental Status: He is alert and oriented to person, place, and time.  Psychiatric:        Mood and Affect: Mood normal.     ED Results / Procedures / Treatments   Labs (all labs ordered are listed, but only abnormal results are displayed) Labs Reviewed  CBC WITH DIFFERENTIAL/PLATELET - Abnormal; Notable for the following components:      Result Value   RBC 3.40 (*)    Hemoglobin 9.1 (*)    HCT 29.5 (*)    RDW 15.9 (*)    Abs Immature Granulocytes 0.11 (*)    All other components within normal limits  COMPREHENSIVE METABOLIC PANEL - Abnormal; Notable for the following components:   Sodium 132 (*)    CO2 21 (*)    Glucose, Bld 111 (*)    BUN 30 (*)    Creatinine, Ser 1.62 (*)    Total Protein 6.3 (*)    Albumin 2.5 (*)    Alkaline Phosphatase 33 (*)    GFR calc non Af Amer 38 (*)    GFR calc Af Amer 44 (*)    All other components within normal limits  LIPASE, BLOOD - Abnormal; Notable for the following components:   Lipase 64 (*)    All other components within normal limits  SEDIMENTATION RATE - Abnormal; Notable for the following components:   Sed Rate 75 (*)    All other components within normal limits  C-REACTIVE PROTEIN - Abnormal; Notable for the following components:   CRP 13.2 (*)    All other components within normal limits  APTT - Abnormal; Notable for the following components:   aPTT 67 (*)    All other components within normal limits  PROTIME-INR - Abnormal; Notable for the following components:   Prothrombin Time 22.6 (*)    INR 2.1 (*)    All other  components within normal limits  RESPIRATORY PANEL BY RT PCR (FLU A&B, COVID)  CULTURE, BLOOD (ROUTINE X 2)  CULTURE, BLOOD (ROUTINE X 2)  URINE CULTURE  LACTIC ACID, PLASMA  URINALYSIS, ROUTINE W REFLEX MICROSCOPIC  CBG MONITORING, ED    EKG EKG  Interpretation  Date/Time:  Thursday May 28 2019 14:58:28 EDT Ventricular Rate:  102 PR Interval:    QRS Duration: 128 QT Interval:  332 QTC Calculation: 433 R Axis:   -26 Text Interpretation: Fast sinus arrhythmia Right bundle branch block Baseline wander in lead(s) I similar to earlier in the day Confirmed by Sherwood Gambler [phone]) on 05/28/2019 3:07:05 PM   Radiology DG Chest Portable 1 View  Result Date: 05/28/2019 CLINICAL DATA:  Fever. EXAM: PORTABLE CHEST 1 VIEW COMPARISON:  None. FINDINGS: The heart size and mediastinal contours are within normal limits. Both lungs are clear. The visualized skeletal structures are unremarkable. IMPRESSION: No active disease. Electronically Signed   By: Marijo Conception M.D.   On: 05/28/2019 13:22   CLINICAL DATA: Postoperative left hip infection.  EXAM: DG HIP (WITH OR WITHOUT PELVIS) 2-3V LEFT  COMPARISON: Fluoroscopic images of May 12, 2019.  FINDINGS: The left femoral and acetabular components appear to be well situated. No fracture or dislocation is noted. Gas is noted in the surrounding soft tissues concerning for postoperative change or possibly infection.  IMPRESSION: Status post left total hip arthroplasty. Gas is noted in surrounding soft tissues which may represent postoperative change, but infection cannot be excluded.   Electronically Signed By: Marijo Conception M.D. On: 05/28/2019 13:23  Procedures Procedures (including critical care time)  Medications Ordered in ED Medications  sodium chloride 0.9 % bolus 1,000 mL (has no administration in time range)    And  sodium chloride 0.9 % bolus 1,000 mL (has no administration in time range)    And  sodium chloride 0.9 % bolus 250 mL (has no administration in time range)  metroNIDAZOLE (FLAGYL) IVPB 500 mg (500 mg Intravenous New Bag/Given 05/28/19 1511)  vancomycin (VANCOCIN) IVPB 1000 mg/200 mL premix (has no administration in time range)  ceFEPIme (MAXIPIME) 2 g in  sodium chloride 0.9 % 100 mL IVPB (2 g Intravenous New Bag/Given 05/28/19 1507)  acetaminophen (TYLENOL) tablet 650 mg (650 mg Oral Given 05/28/19 1524)    ED Course  I have reviewed the triage vital signs and the nursing notes.  Pertinent labs & imaging results that were available during my care of the patient were reviewed by me and considered in my medical decision making (see chart for details).    MDM Rules/Calculators/A&P                      BP (!) 167/73   Pulse 71   Temp (!) 100.9 F (38.3 C) (Rectal)   Resp 14   SpO2 95%   Final Clinical Impression(s) / ED Diagnoses Final diagnoses:  None    Rx / DC Orders ED Discharge Orders    None     12:39 PM Patient with recently diagnosed left hip prosthetic infection requiring hospitalization and subsequently discharged home with IV penicillin for the past few weeks is here with complaints of intermittent fever as well as hypoglycemic episodes.  He report a CBG of 29 this morning, felt very shaky, and has a document temperature of 102.1 at home improves with food and with Tylenol.  At this time he is mentating appropriately, CBG is 87, vital signs stable,He is  afebrile.  Will perform screening labs.   2:52 PM X-ray of the left hip demonstrate diastases noted to the surrounding soft tissue which may represents postoperative change but infection cannot be excluded.  Since patient is still having lingering fever, now documented as 100.9, tachycardic, and potentially having unresolved infection despite being on IV penicillin, will consult orthopedist, Dr. Percell Miller, for recommendation.  We will broaden his antibiotic treatment and code sepsis has been initiated.  Will provide fluid resuscitation at 30 mL/kg.  Care discussed with Dr. Regenia Skeeter.    4:00 PM I have consulted Dr. Percell Miller who recommend for pt to f/u in office with him and also with ID specialist Dr. Carlyle Basques if no other source of infection was identified.    Pt has  normal lactic acid and normal WBC, therefore code sepsis was cancelled.  CXR without infection.  UA is currently pending.  Pt sign out to oncoming provider who will f/u on UA result and will consult ID specialist Dr. Baxter Flattery for further recommendation and determine disposition. Pt without headache to suggest meningitis.   COVID-19 is negative.     Domenic Moras, PA-C 05/28/19 1603    Sherwood Gambler, MD 05/29/19 [phone]

## 2019-05-28 NOTE — ED Provider Notes (Addendum)
Patient is an 86 year old male who was transferred to me at shift change from [facility].  Please see his note below for in-depth information but to summarize patient presented today for intermittent fevers.  2 weeks ago he had his left hip irrigated and debrided by Dr. Percell Miller.  Since, he has been experiencing intermittent fevers with a T-max at home of 102.9 Fahrenheit.  His highest temperature here was 100.9 Fahrenheit.  He has been on IV penicillin at home.  He states he has been compliant with this.  He denies any new hip pain.  Rona Ravens PA-C spoke to Dr. Percell Miller regarding patient status, labs, hip x-ray.  UA is pending and Dr. Percell Miller requested that if we do not find a source of infection consult with Dr. Carlyle Basques at infectious disease.  If she does not have any additional recommendations he can follow-up with both of them outpatient.  Physical Exam  BP (!) 167/73   Pulse 71   Temp (!) 100.9 F (38.3 C) (Rectal)   Resp 14   SpO2 95%   Physical Exam Vitals and nursing note reviewed.  Constitutional:      General: He is not in acute distress.    Appearance: Normal appearance. He is normal weight. He is not ill-appearing, toxic-appearing or diaphoretic.  HENT:     Head: Normocephalic and atraumatic.     Right Ear: External ear normal.     Left Ear: External ear normal.     Nose: Nose normal.     Mouth/Throat:     Pharynx: Oropharynx is clear.  Eyes:     Extraocular Movements: Extraocular movements intact.     Comments: Pinpoint pupils that are nonreactive to light  Cardiovascular:     Rate and Rhythm: Regular rhythm. Tachycardia present.     Pulses: Normal pulses.     Heart sounds: Normal heart sounds. No murmur. No friction rub. No gallop.   Pulmonary:     Effort: Pulmonary effort is normal. No respiratory distress.     Breath sounds: Normal breath sounds. No stridor. No wheezing, rhonchi or rales.  Abdominal:     General: Abdomen is flat.     Palpations: Abdomen is soft.      Tenderness: There is no abdominal tenderness.  Musculoskeletal:     Cervical back: Neck supple.     Comments: Vertical linear incision noted to the left lateral hip.  Wound is healing well.  No discharge or surrounding erythema.  Wound is not warm to touch.  Minimal overlying TTP noted.  Skin:    General: Skin is warm and dry.  Neurological:     General: No focal deficit present.     Mental Status: He is alert and oriented to person, place, and time.  Psychiatric:        Mood and Affect: Mood normal.        Behavior: Behavior normal.    ED Course/Procedures     Procedures  MDM   Patient is a pleasant 86 year old male that was transferred to me at shift change from [facility].  Patient recently had an infected left hip which was debrided about 2 weeks ago by Dr. Percell Miller.  They discussed his lab values as well as his imaging and Dr. Percell Miller recommended that we discuss with infectious disease, Carlyle Basques if his UA was negative.  I discussed this with Dr. Baxter Flattery and based on his lab results and imaging she recommends that we obtain blood cultures, which we have  done, and have him admitted for follow-up.  We have not found a source of infection or reason for his transient fevers but we discussed the possibility that his line for which she has been receiving IV penicillin at home could be the source of this.  His wound does not appear infectious today.  Is not erythematous or edematous.  No discharge.  He is only minimally tender over the site.  I discussed this with the patient he was amenable with this plan.  I additionally discussed this patient with the hospitalist, Dr. Jens Som, who recommend that he be admitted for observation.  Note: Portions of this report may have been transcribed using voice recognition software. Every effort was made to ensure accuracy; however, inadvertent computerized transcription errors may be present.         Rayna Sexton, PA-C 05/28/19 2058     Rayna Sexton, PA-C 05/28/19 2147    Veryl Speak, MD 05/28/19 2242

## 2019-05-28 NOTE — ED Triage Notes (Signed)
Patient BIB girlfriend, reports hypoglycemia in the mornings and fever x4 days. Reports penicillin infusing in port to treat reported hip infection. CBG reported 29 this morning. Afebrile in triage. Last tylenol this morning.

## 2019-05-28 NOTE — Telephone Encounter (Signed)
Patient's son Casey [name] called to relay continued fever over night (102+) and a sugar crash (blood glucose 29).  They treated both the fever and the low sugar at home, are bringing him to [facility] ER this morning. Landis Gandy, RN

## 2019-05-29 ENCOUNTER — Inpatient Hospital Stay (HOSPITAL_COMMUNITY): Payer: Medicare Other

## 2019-05-29 DIAGNOSIS — R338 Other retention of urine: Secondary | ICD-10-CM

## 2019-05-29 DIAGNOSIS — R310 Gross hematuria: Secondary | ICD-10-CM

## 2019-05-29 DIAGNOSIS — T8452XA Infection and inflammatory reaction due to internal left hip prosthesis, initial encounter: Secondary | ICD-10-CM | POA: Diagnosis present

## 2019-05-29 DIAGNOSIS — R197 Diarrhea, unspecified: Secondary | ICD-10-CM

## 2019-05-29 DIAGNOSIS — N400 Enlarged prostate without lower urinary tract symptoms: Secondary | ICD-10-CM | POA: Insufficient documentation

## 2019-05-29 DIAGNOSIS — I513 Intracardiac thrombosis, not elsewhere classified: Secondary | ICD-10-CM

## 2019-05-29 DIAGNOSIS — T8452XD Infection and inflammatory reaction due to internal left hip prosthesis, subsequent encounter: Secondary | ICD-10-CM

## 2019-05-29 DIAGNOSIS — N179 Acute kidney failure, unspecified: Secondary | ICD-10-CM

## 2019-05-29 DIAGNOSIS — R509 Fever, unspecified: Secondary | ICD-10-CM

## 2019-05-29 DIAGNOSIS — M009 Pyogenic arthritis, unspecified: Secondary | ICD-10-CM | POA: Diagnosis not present

## 2019-05-29 HISTORY — DX: Gross hematuria: R31.0

## 2019-05-29 LAB — CBC
HCT: 25.8 % — ABNORMAL LOW (ref 39.0–52.0)
Hemoglobin: 8 g/dL — ABNORMAL LOW (ref 13.0–17.0)
MCH: 27.4 pg (ref 26.0–34.0)
MCHC: 31 g/dL (ref 30.0–36.0)
MCV: 88.4 fL (ref 80.0–100.0)
Platelets: 279 10*3/uL (ref 150–400)
RBC: 2.92 MIL/uL — ABNORMAL LOW (ref 4.22–5.81)
RDW: 15.9 % — ABNORMAL HIGH (ref 11.5–15.5)
WBC: 6.2 10*3/uL (ref 4.0–10.5)
nRBC: 0 % (ref 0.0–0.2)

## 2019-05-29 LAB — SYNOVIAL CELL COUNT + DIFF, W/ CRYSTALS
Crystals, Fluid: NONE SEEN
Eosinophils-Synovial: 0 % (ref 0–1)
Lymphocytes-Synovial Fld: 1 % (ref 0–20)
Monocyte-Macrophage-Synovial Fluid: 8 % — ABNORMAL LOW (ref 50–90)
Neutrophil, Synovial: 91 % — ABNORMAL HIGH (ref 0–25)
WBC, Synovial: 20500 /mm3 — ABNORMAL HIGH (ref 0–200)

## 2019-05-29 LAB — GLUCOSE, CAPILLARY: Glucose-Capillary: 148 mg/dL — ABNORMAL HIGH (ref 70–99)

## 2019-05-29 LAB — BASIC METABOLIC PANEL
Anion gap: 8 (ref 5–15)
BUN: 26 mg/dL — ABNORMAL HIGH (ref 8–23)
CO2: 19 mmol/L — ABNORMAL LOW (ref 22–32)
Calcium: 9.3 mg/dL (ref 8.9–10.3)
Chloride: 106 mmol/L (ref 98–111)
Creatinine, Ser: 1.49 mg/dL — ABNORMAL HIGH (ref 0.61–1.24)
GFR calc Af Amer: 48 mL/min — ABNORMAL LOW (ref >60)
GFR calc non Af Amer: 42 mL/min — ABNORMAL LOW (ref >60)
Glucose, Bld: 99 mg/dL (ref 70–99)
Potassium: 4.2 mmol/L (ref 3.5–5.1)
Sodium: 133 mmol/L — ABNORMAL LOW (ref 135–145)

## 2019-05-29 LAB — URINE CULTURE: Culture: NO GROWTH

## 2019-05-29 LAB — C DIFFICILE QUICK SCREEN W PCR REFLEX
C Diff antigen: NEGATIVE
C Diff interpretation: NOT DETECTED
C Diff toxin: NEGATIVE

## 2019-05-29 LAB — CBG MONITORING, ED
Glucose-Capillary: 91 mg/dL (ref 70–99)
Glucose-Capillary: 95 mg/dL (ref 70–99)
Glucose-Capillary: 152 mg/dL — ABNORMAL HIGH (ref 70–99)

## 2019-05-29 MED ORDER — BACLOFEN 10 MG PO TABS: 10.0000 mg | ORAL_TABLET | Freq: Two times a day (BID) | ORAL | Status: DC

## 2019-05-29 MED ORDER — CHLORHEXIDINE GLUCONATE CLOTH 2 % EX PADS
6.0000 | MEDICATED_PAD | Freq: Every day | CUTANEOUS | Status: DC
  Administered 2019-05-29 – 2019-06-04 (×7): 6 via TOPICAL

## 2019-05-29 MED ORDER — VITAMIN D3 25 MCG (1000 UNIT) PO TABS
5000.0000 [IU] | ORAL_TABLET | Freq: Every day | ORAL | Status: DC
  Administered 2019-05-29 – 2019-06-04 (×6): 5000 [IU] via ORAL
  Filled 2019-05-29 (×6): qty 5

## 2019-05-29 MED ORDER — METRONIDAZOLE IN NACL 5-0.79 MG/ML-% IV SOLN
500.0000 mg | Freq: Three times a day (TID) | INTRAVENOUS | Status: DC
  Administered 2019-05-29 (×3): 500 mg via INTRAVENOUS
  Filled 2019-05-29 (×3): qty 100

## 2019-05-29 MED ORDER — LOPERAMIDE HCL 2 MG PO CAPS: 2.0000 mg | ORAL_CAPSULE | ORAL | Status: DC | PRN

## 2019-05-29 MED ORDER — ADULT MULTIVITAMIN W/MINERALS CH
1.0000 | ORAL_TABLET | Freq: Every day | ORAL | Status: DC
  Administered 2019-05-29 – 2019-06-04 (×6): 1 via ORAL
  Filled 2019-05-29 (×7): qty 1

## 2019-05-29 MED ORDER — VANCOMYCIN HCL 750 MG/150ML IV SOLN
750.0000 mg | INTRAVENOUS | Status: DC
  Administered 2019-05-29 – 2019-05-30 (×2): 750 mg via INTRAVENOUS
  Filled 2019-05-29 (×2): qty 150

## 2019-05-29 MED ORDER — TAB-A-VITE/IRON PO TABS
1.0000 | ORAL_TABLET | Freq: Every day | ORAL | Status: DC
  Administered 2019-05-30 – 2019-06-04 (×6): 1 via ORAL
  Filled 2019-05-29 (×6): qty 1

## 2019-05-29 MED ORDER — OMEGA-3-ACID ETHYL ESTERS 1 G PO CAPS
1.0000 | ORAL_CAPSULE | Freq: Every day | ORAL | Status: DC
  Administered 2019-05-29 – 2019-06-04 (×6): 1 g via ORAL
  Filled 2019-05-29 (×6): qty 1

## 2019-05-29 MED ORDER — METHOCARBAMOL 500 MG PO TABS
500.0000 mg | ORAL_TABLET | Freq: Four times a day (QID) | ORAL | Status: DC | PRN
  Administered 2019-05-31: 500 mg via ORAL
  Filled 2019-05-29: qty 1

## 2019-05-29 MED ORDER — METOPROLOL SUCCINATE ER 25 MG PO TB24
25.0000 mg | ORAL_TABLET | Freq: Every day | ORAL | Status: DC
  Administered 2019-05-29 – 2019-06-04 (×7): 25 mg via ORAL
  Filled 2019-05-29 (×7): qty 1

## 2019-05-29 MED ORDER — DEXTROSE-NACL 5-0.9 % IV SOLN: INTRAVENOUS | Status: DC

## 2019-05-29 MED ORDER — ENSURE ENLIVE PO LIQD
237.0000 mL | Freq: Two times a day (BID) | ORAL | Status: DC
  Administered 2019-05-30 – 2019-06-04 (×5): 237 mL via ORAL

## 2019-05-29 MED ORDER — LISINOPRIL 5 MG PO TABS
2.5000 mg | ORAL_TABLET | Freq: Every day | ORAL | Status: DC
  Administered 2019-05-29 – 2019-06-04 (×7): 2.5 mg via ORAL
  Filled 2019-05-29 (×7): qty 1

## 2019-05-29 MED ORDER — LIDOCAINE HCL (PF) 1 % IJ SOLN
INTRAMUSCULAR | Status: AC
  Filled 2019-05-29: qty 30

## 2019-05-29 MED ORDER — SODIUM CHLORIDE 0.9 % IV SOLN
2.0000 g | INTRAVENOUS | Status: DC
  Administered 2019-05-29 – 2019-06-04 (×7): 2 g via INTRAVENOUS
  Filled 2019-05-29 (×7): qty 2

## 2019-05-29 MED ORDER — FENOFIBRATE 160 MG PO TABS
160.0000 mg | ORAL_TABLET | Freq: Every day | ORAL | Status: DC
  Administered 2019-05-29 – 2019-06-04 (×7): 160 mg via ORAL
  Filled 2019-05-29 (×7): qty 1

## 2019-05-29 MED ORDER — FINASTERIDE 5 MG PO TABS
5.0000 mg | ORAL_TABLET | Freq: Every day | ORAL | Status: DC
  Administered 2019-05-29 – 2019-06-04 (×7): 5 mg via ORAL
  Filled 2019-05-29 (×7): qty 1

## 2019-05-29 MED ORDER — SODIUM CHLORIDE 0.9 % IV SOLN
2.0000 g | INTRAVENOUS | Status: DC
  Administered 2019-05-29: 2 g via INTRAVENOUS
  Filled 2019-05-29: qty 2

## 2019-05-29 MED ORDER — INSULIN ASPART 100 UNIT/ML ~~LOC~~ SOLN
0.0000 [IU] | Freq: Three times a day (TID) | SUBCUTANEOUS | Status: DC
  Administered 2019-05-29: 2 [IU] via SUBCUTANEOUS
  Administered 2019-05-30 (×2): 1 [IU] via SUBCUTANEOUS
  Administered 2019-05-30: 3 [IU] via SUBCUTANEOUS
  Administered 2019-05-31: 1 [IU] via SUBCUTANEOUS
  Administered 2019-05-31: 2 [IU] via SUBCUTANEOUS
  Administered 2019-05-31: 3 [IU] via SUBCUTANEOUS
  Administered 2019-06-01: 1 [IU] via SUBCUTANEOUS
  Administered 2019-06-01: 5 [IU] via SUBCUTANEOUS
  Administered 2019-06-01: 1 [IU] via SUBCUTANEOUS
  Administered 2019-06-02: 17:00:00 9 [IU] via SUBCUTANEOUS
  Administered 2019-06-02: 2 [IU] via SUBCUTANEOUS
  Administered 2019-06-02 – 2019-06-03 (×2): 5 [IU] via SUBCUTANEOUS
  Administered 2019-06-03: 09:00:00 1 [IU] via SUBCUTANEOUS
  Administered 2019-06-03 – 2019-06-04 (×2): 3 [IU] via SUBCUTANEOUS
  Administered 2019-06-04: 5 [IU] via SUBCUTANEOUS
  Filled 2019-05-29: qty 0.09

## 2019-05-29 MED ORDER — TAMSULOSIN HCL 0.4 MG PO CAPS
0.4000 mg | ORAL_CAPSULE | Freq: Every day | ORAL | Status: DC
  Administered 2019-05-29: 0.4 mg via ORAL
  Filled 2019-05-29: qty 1

## 2019-05-29 MED ORDER — SODIUM BICARBONATE 650 MG PO TABS
650.0000 mg | ORAL_TABLET | Freq: Three times a day (TID) | ORAL | Status: DC
  Administered 2019-05-29 – 2019-06-03 (×15): 650 mg via ORAL
  Filled 2019-05-29 (×16): qty 1

## 2019-05-29 MED ORDER — LEVOTHYROXINE SODIUM 25 MCG PO TABS
25.0000 ug | ORAL_TABLET | Freq: Every day | ORAL | Status: DC
  Administered 2019-05-29 – 2019-06-04 (×7): 25 ug via ORAL
  Filled 2019-05-29 (×8): qty 1

## 2019-05-29 NOTE — ED Notes (Signed)
Pt bladder scan shows 820 mL.

## 2019-05-29 NOTE — H&P (Signed)
History and Physical    Casey [name] KTG:[id number] DOB: 07-01-1929 DOA: 05/28/2019  PCP: Glenda Chroman, MD   Patient coming from: Home  I have personally briefly reviewed patient's old medical records in [place]  Chief Complaint: Fevers and chills, low blood gluocse  HPI: Casey [name] is a 86 y.o. male with medical history significant of diabetes mellitus, hypothyroidism, hypertension, chronic kidney disease stage III, anemia. Patient has a hip surgery with left hip replacement in November 2020.  Patient was found to have multiloculated joint effusion concerning for septic arthritis and was admitted on 05/09/2019 to [facility], was seen in consultation by Dr. Percell Miller and Dr. 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  Patient states that he got his second dose of COVID-19 vaccine last Thursday, moderna vaccine. Patient has been experiencing intermittent fevers with a T-max at home of 102.9 Fahrenheit.  His highest temperature here in the ER was 100.9 Fahrenheit.  He has been on IV penicillin at home.  He states he has been compliant with this.  He denies any new hip pain.    Patient did mention that he has been having diarrhea 2-3 episodes, loose stools every day since the last for 5 days.  Also did complain of hypoglycemia with his blood glucose level dropping down to 20s, he stopped his oral medications and it has improved since then.  Patient complains of chills.  Patient denies any chest pain.  Denies any shortness of breath.  He does complain of some coughing with occasional sputum production which is clear.  Denies any abdominal pain, nausea or any vomitings.  Denies any hip pain.  Denies any discharge from the left hip site.  While the patient was in the ER he did have a bowel movement which was  loose, not watery, there was some bright red blood in the bedside commode.  Patient mentions that blood came out from his bowels and also some from his urine.  Patient did mention that he took his Xarelto this morning.   ED Course:  Haywood Pao spoke to Dr. Percell Miller regarding patient status, and also spoke with Dr Baxter Flattery ID.  Urinalysis was checked and no source of any infection was seen, chest x-ray showed no evidence of any pneumonia.  Patient was treated with broad-spectrum antibiotics with vancomycin, cefepime, Flagyl.   Review of Systems: Ten point review of systems reviewed  in detail and negative except as mentioned above in the HPI.   Past Medical History:  Diagnosis Date  . Anemia   . Arthritis    "all over" all joints  . Bladder cancer (HCC)    bladder  . Blood transfusion 2008  . Chronic kidney disease    bladder ca in 2008;Kid. failure stage 3  . Complication of anesthesia    Ilios post op  . Coronary artery disease   . CVA (cerebral vascular accident) ([place]) 03/04/2019  . Diabetes mellitus   . DJD (degenerative joint disease)   . Guaiac + stool 06/28/2017  . Heart murmur   . Hypothyroidism    hyper thyroidism recently  . Ileus ([place]) 2001   post hip replacement  . Metabolic acidosis   . Metabolic bone disease   . Myocardial infarction [facility]) age 39    Past Surgical  History:  Procedure Laterality Date  . BLADDER SURGERY  2008  . CATARACT EXTRACTION W/ INTRAOCULAR LENS  IMPLANT, BILATERAL Bilateral   . CHOLECYSTECTOMY    . COLONOSCOPY  09/07/2010   Procedure: COLONOSCOPY;  Surgeon: Rogene Houston, MD;  Location: [facility]ITE;  Service: Endoscopy;  Laterality: N/A;  . CORONARY ANGIOPLASTY    . INGUINAL HERNIA REPAIR  2005  . TOTAL HIP ARTHROPLASTY Left 12/23/2018   Procedure: TOTAL HIP ARTHROPLASTY ANTERIOR APPROACH;  Surgeon: Renette Butters, MD;  Location: [facility] ORS;  Service: Orthopedics;  Laterality: Left;  . TOTAL HIP ARTHROPLASTY Left 05/12/2019   Procedure:  irrigation and debridement septic hip headliner exchange;  Surgeon: Renette Butters, MD;  Location: [facility] ORS;  Service: Orthopedics;  Laterality: Left;  wants hana table    Social History  reports that he quit smoking about 28 years ago. He has never used smokeless tobacco. He reports current alcohol use. He reports that he does not use drugs.  Allergies  Allergen Reactions  . Sulfa Antibiotics     unknown    Family History  Problem Relation Age of Onset  . CAD Mother   . CAD Father      Prior to Admission medications   Medication Sig Start Date End Date Taking? Authorizing Provider  acetaminophen (TYLENOL) 500 MG tablet Take 1,000 mg by mouth every 6 (six) hours as needed for moderate pain.   Yes [provider]  Cholecalciferol (VITAMIN D3) 125 MCG (5000 UT) CAPS Take 1 capsule by mouth daily.   Yes [provider]  Choline Fenofibrate (FENOFIBRIC ACID) 135 MG CPDR Take 135 mg by mouth daily.    Yes [provider]  Fe Bisgly-Vit C-Vit B12-FA (GENTLE IRON PO) Take 1 capsule by mouth daily.   Yes [provider]  finasteride (PROSCAR) 5 MG tablet Take 5 mg by mouth daily. 11/18/18  Yes [provider]  glimepiride (AMARYL) 4 MG tablet Take 4 mg by mouth in the morning and at bedtime. Prescribed twice daily, patient may take once daily based on his BS level.   Yes [provider]  HYDROcodone-acetaminophen (NORCO) 5-325 MG tablet Take 1-2 tablets by mouth every 6 (six) hours as needed for severe pain (Use tylenol for mild and moderate pain.). 05/12/19  Yes Martensen, Charna Elizabeth III, PA-C  levothyroxine (SYNTHROID) 25 MCG tablet Take 25 mcg by mouth daily before breakfast.   Yes [provider]  lisinopril (ZESTRIL) 2.5 MG tablet Take 2.5 mg by mouth daily.    Yes [provider]  loratadine (CLARITIN) 10 MG tablet Take 10 mg by mouth daily as needed for allergies.   Yes [provider]  metoprolol  succinate (TOPROL-XL) 25 MG 24 hr tablet Take 25 mg by mouth daily.    Yes [provider]  Omega-3 Fatty Acids (FISH OIL) 1000 MG CAPS Take 1 capsule by mouth daily.   Yes [provider]  penicillin G IVPB Inject 24 Million Units into the vein continuous. Infuse 24 million units at continuous infusion over 24 hrs Indication: Group B streptococcus prosthetic joint infection of hip Last Day of Therapy: 06/24/2019 Labs - Once weekly:  CBC/D and CMP, Labs - Every other week:  ESR and CRP 05/13/19 06/24/19 Yes Pokhrel, Laxman, MD  rivaroxaban (XARELTO) 10 MG TABS tablet Take 10 mg by mouth daily.   Yes [provider]  sodium bicarbonate 650 MG tablet Take 650 mg by mouth 3 (three) times daily.  Yes [provider]  Tamsulosin HCl (FLOMAX) 0.4 MG CAPS Take 0.4 mg by mouth in the morning and at bedtime.    Yes [provider]  baclofen (LIORESAL) 10 MG tablet Take 1 tablet (10 mg total) by mouth 2 (two) times daily. Patient not taking: Reported on 05/28/2019 05/12/19   Prudencio Burly III, PA-C  docusate sodium (COLACE) 100 MG capsule Take 1 capsule (100 mg total) by mouth 2 (two) times daily. To prevent constipation while taking pain medication. Patient not taking: Reported on 05/11/2019 12/23/18   Prudencio Burly III, PA-C  methocarbamol (ROBAXIN) 500 MG tablet Take 1 tablet (500 mg total) by mouth every 6 (six) hours as needed for muscle spasms. Patient not taking: Reported on 05/28/2019 05/14/19   Flora Lipps, MD  Multiple Vitamin (MULTIVITAMIN) tablet Take 1 tablet by mouth daily.    [provider]  ondansetron (ZOFRAN) 4 MG tablet Take 1 tablet (4 mg total) by mouth every 8 (eight) hours as needed for nausea or vomiting. Patient not taking: Reported on 05/11/2019 12/23/18   Prudencio Burly III, PA-C    Physical Exam: Vitals:   05/28/19 2300 05/28/19 2330 05/28/19 2352 05/29/19 0000  BP: 133/76 (!) 166/82 (!) 166/82  (!) 152/80  Pulse: (!) 102 (!) 116 (!) 102 (!) 106  Resp: 20  20   Temp:      TempSrc:      SpO2: 95% 97% 97% 98%    Constitutional: Patient lying in the bed in no acute distress, he is calm and comfortable Vitals:   05/28/19 2300 05/28/19 2330 05/28/19 2352 05/29/19 0000  BP: 133/76 (!) 166/82 (!) 166/82 (!) 152/80  Pulse: (!) 102 (!) 116 (!) 102 (!) 106  Resp: 20  20   Temp:      TempSrc:      SpO2: 95% 97% 97% 98%   Eyes: PERRL, lids normal, No pallor, No icterus ENMT: Mucous membranes are moist. Posterior pharynx clear of any exudate or lesions.Normal dentition.  Neck: normal, supple, no masses, no thyromegaly Respiratory: clear to auscultation bilaterally, no wheezing, no crackles. Normal respiratory effort. No accessory muscle use.  Cardiovascular: S1 S2 Heard, rate and rhythm regular. No extremity edema. 2+ pedal pulses. No carotid bruits.  Abdomen: Soft, no tenderness, non distended, no masses palpated. Bowel sounds positive.  Musculoskeletal: no clubbing / cyanosis. No joint deformity upper and lower extremities. Good ROM, no contractures. Normal muscle tone.  Skin: warm and dry. no rashes noted on limited skin examination. Left hip surgical site is clean/dr and intact, steri strips in place, slightly warm to touch around the site, no erythema noted. Does have right arm picc line, no signs of infection noted around the picc line insertion site. Neurologic: CN 2-12 grossly intact. Sensation intact, DTR normal. Strength 5/5 in all 4.  Psychiatric: Normal judgment and insight. Alert and oriented x 3. Normal mood.    Labs on Admission: I have personally reviewed following labs and imaging studies  CBC: Recent Labs  Lab 05/28/19 1300  WBC 5.4  NEUTROABS 3.1  HGB 9.1*  HCT 29.5*  MCV 86.8  PLT 353    Basic Metabolic Panel: Recent Labs  Lab 05/28/19 1300  NA 132*  K 4.8  CL 101  CO2 21*  GLUCOSE 111*  BUN 30*  CREATININE 1.62*  CALCIUM 10.0    GFR: CrCl  cannot be calculated (Unknown ideal weight.).  Liver Function Tests: Recent Labs  Lab 05/28/19 1300  AST 22  ALT 12  ALKPHOS 33*  BILITOT 0.5  PROT 6.3*  ALBUMIN 2.5*    Urine analysis:    Component Value Date/Time   COLORURINE YELLOW 05/28/2019 1627   APPEARANCEUR CLEAR 05/28/2019 1627   LABSPEC 1.012 05/28/2019 1627   PHURINE 7.0 05/28/2019 1627   GLUCOSEU NEGATIVE 05/28/2019 1627   HGBUR MODERATE (A) 05/28/2019 1627   BILIRUBINUR NEGATIVE 05/28/2019 1627   KETONESUR NEGATIVE 05/28/2019 1627   PROTEINUR NEGATIVE 05/28/2019 1627   NITRITE NEGATIVE 05/28/2019 1627   LEUKOCYTESUR NEGATIVE 05/28/2019 1627    Radiological Exams on Admission: DG Chest Portable 1 View  Result Date: 05/28/2019 CLINICAL DATA:  Fever. EXAM: PORTABLE CHEST 1 VIEW COMPARISON:  None. FINDINGS: The heart size and mediastinal contours are within normal limits. Both lungs are clear. The visualized skeletal structures are unremarkable. IMPRESSION: No active disease. Electronically Signed   By: Marijo Conception M.D.   On: 05/28/2019 13:22   DG Hip Unilat W or Wo Pelvis 2-3 Views Left  Result Date: 05/28/2019 CLINICAL DATA:  Postoperative left hip infection. EXAM: DG HIP (WITH OR WITHOUT PELVIS) 2-3V LEFT COMPARISON:  Fluoroscopic images of May 12, 2019. FINDINGS: The left femoral and acetabular components appear to be well situated. No fracture or dislocation is noted. Gas is noted in the surrounding soft tissues concerning for postoperative change or possibly infection. IMPRESSION: Status post left total hip arthroplasty. Gas is noted in surrounding soft tissues which may represent postoperative change, but infection cannot be excluded. Electronically Signed   By: Marijo Conception M.D.   On: 05/28/2019 13:23    EKG: Independently reviewed. EKG was personally reviewed by me, shows sinus arrhythmia at 102 bpm, right bundle branch block.  No acute ST elevations or ST depressions are signed by  me.  Assessment/Plan Active Problems:   Sepsis (HCC)   Fever   Sepsis, POA -status as evidenced by fever of 102 F , lactic acid of 1.9, elevated CRP, tachycardia with heart rate greater than 100 Fevers and chills, POA Possible left hip infection, POA -Admit the patient to the MedSurg floor -Empiric antibiotics with vancomycin/cefepime/Flagyl -Orthopedics Dr. Percell Miller and infectious disease Dr. Baxter Flattery informed by ER -We will check a CT of the left hip -Urinalysis showed no evidence of any infection, chest x-ray showed no evidence of any pneumonia -Blood cultures have been sent out.  Patient does have a PICC line will need to follow-up on the culture results to make sure there is no bacteremia.  Diarrhea, POA -We will check a stool for C. Difficile -Enteric precautions -We will add probiotics  Recent septic arthritis of the left hip prosthesis. - s/p irrigation and debridement on 05/12/2019 by Dr. Percell Miller.    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  -Will need to resume anticoagulation in am -concern for some blood noted in the stool and urine in the ER  -If Ct of hip is ok and if no further surgical intervention is planned then probably can resume anticoagulation in the a.m.  Patient did mention that he took his Xarelto this morning.  Acute kidney injury on chronic kidney disease stage IIIb,POA -We will closely monitor BMP. -Avoid nephrotoxic agents -Monitor input and output -Consult nephrology if renal function worsening  Chronic anemia likely from renal disease -Monitor CBC closely transfuse as necessary.    BPH -cont flomax  Hypothyroidism -on Synthroid, continue.  Diabetes mellitus type 2 -Continue sliding scale insulin, Accu-Cheks  diabetic diet.  Nonobstructive CAD -Stable   DVT prophylaxis: Patient is already on Xarelto. Please resume Xarelto in AM If Ct of hip is ok and if no further  surgical intervention is planned then probably can resume anticoagulation in the a.m. Code Status: Full code Family Communication: No family at the bedside Disposition Plan: Might need skilled nursing facility placement Consults called: Orthopedics Dr. Percell Miller and infectious disease Dr. Baxter Flattery informed by ER Admission status: Inpatient, MedSurg  Severity of Illness: The appropriate patient status for this patient is INPATIENT. Inpatient status is judged to be reasonable and necessary in order to provide the required intensity of service to ensure the patient's safety. The patient's presenting symptoms, physical exam findings, and initial radiographic and laboratory data in the context of their chronic comorbidities is felt to place them at high risk for further clinical deterioration. Furthermore, it is not anticipated that the patient will be medically stable for discharge from the hospital within 2 midnights of admission. The following factors support the patient status of inpatient.   " The patient's presenting symptoms include fevers,chills,diarrhea,hypoglycemia. " The worrisome physical exam findings include possible left hip infection. " The initial radiographic and laboratory data are worrisome because of elevated lactic acid, possible infection/gas noted on the xray of the hip. " The chronic co-morbidities include diabetes mellitus type 2, bladder cancer, hypertension, CVA.   * I certify that at the point of admission it is my clinical judgment that the patient will require inpatient hospital care spanning beyond 2 midnights from the point of admission due to high intensity of service, high risk for further deterioration and high frequency of surveillance required.Jenkins Rouge MD [facility]  How to contact the [facility] Attending or Consulting provider [place] or covering provider during after hours [place], for this patient?   1. Check the care team in [facility] and look for a)  attending/consulting [facility] provider listed and b) the [facility] team listed 2. Log into [email or web address] and use [place]'s universal password to access. If you do not have the password, please contact the hospital operator. 3. Locate the [facility] provider you are looking for under [facility] and page to a number that you can be directly reached. 4. If you still have difficulty reaching the provider, please page the [name] (Director on Call) for the Hospitalists listed on amion for assistance.  05/29/2019, 12:29 AM

## 2019-05-29 NOTE — ED Notes (Signed)
Patient given meal tray. Per MD patient may eat and NPO after midnight.   Patient repositioned in bed and feeding self.   Call bell within reach.

## 2019-05-29 NOTE — ED Notes (Signed)
Patient placed to hospital bed for comfort.

## 2019-05-29 NOTE — Progress Notes (Signed)
PROGRESS NOTE    Casey [name]  [id number] DOB: 1929-07-03 DOA: 05/28/2019 PCP: Glenda Chroman, MD   Chief Complaint  Patient presents with  . Hypoglycemia  . Fever    Brief Narrative:  HPI per Dr. Nils Pyle Natarajan is a 86 y.o. male with medical history significant of diabetes mellitus, hypothyroidism, hypertension, chronic kidney disease stage III, anemia. Patient has a hip surgery with left hip replacement in November 2020.  Patient was found to have multiloculated joint effusion concerning for septic arthritis and was admitted on 05/09/2019 to [facility], was seen in consultation by Dr. Percell Miller and Dr. 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  Patient states that he got his second dose of COVID-19 vaccine last Thursday, moderna vaccine. Patient has been experiencing intermittent fevers with a T-max at home of 102.9 Fahrenheit. His highest temperature here in the ER was 100.9 Fahrenheit. He has been on IV penicillin at home. He states he has been compliant with this. He denies any new hip pain.   Patient did mention that he has been having diarrhea 2-3 episodes, loose stools every day since the last for 5 days.  Also did complain of hypoglycemia with his blood glucose level dropping down to 20s, he stopped his oral medications and it has improved since then.  Patient complains of chills.  Patient denies any chest pain.  Denies any shortness of breath.  He does complain of some coughing with occasional sputum production which is clear.  Denies any abdominal pain, nausea or any vomitings.  Denies any hip pain.  Denies any discharge from the left hip site.  While the patient was in the ER he did have a bowel movement which was loose, not watery, there was some bright red blood in the bedside  commode.  Patient mentions that blood came out from his bowels and also some from his urine.  Patient did mention that he took his Xarelto this morning.   ED Course:  Haywood Pao spoke to Dr. Percell Miller regarding patient status, and also spoke with Dr Baxter Flattery ID.  Urinalysis was checked and no source of any infection was seen, chest x-ray showed no evidence of any pneumonia.  Patient was treated with broad-spectrum antibiotics with vancomycin, cefepime, Flagyl.  Assessment & Plan:   Active Problems:   Sepsis ([place])   Fever   Hematuria, gross  1 sepsis, possibly likely secondary to septic arthritis of the hip POA/recent septic arthritis of the left hip prosthesis status post irrigation and debridement 05/12/2019 per Dr. Percell Miller Patient presenting and meeting criteria for sepsis with a fever with a temp of 102, lactic acid level of 1.9, elevated CRP, tachycardia.  Patient had presented with fever and chills prior to admission.  Initial concern for left hip infection.  CT of the left hip with concern for multiloculated abscess within the anterior hip, interval resolution of posterior multilocular collection seen on prior CT, hyperdense probable blood products within the posterior bladder with markedly distended bladder.  Orthopedics consulted and orthopedics recommending IR aspiration of fluid collection and then further recommendations pending results.  Continue IV vancomycin.  Discontinue IV cefepime and IV Flagyl.  Placed on IV Rocephin.  Consult with ID for antibiotic recommendations and further evaluation.  Orthopedics consulted.  2.  Johney Maine  hematuria/urinary retention Patient noted to have a gross hematuria and noted to have some urinary retention overnight.  Foley catheter placed with 1 L of bloody urine immediately coming out per RN.  Anticoagulation on hold.  Case discussed with urology who recommended CT of the abdomen and pelvis for further evaluation.  Urology will formally consult and make further  recommendations.  Continue to hold anticoagulation for now.  3.  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  Urology consulted.  Urology to determine when okay to resume anticoagulation.  Orthopedics assessing to see whether patient may need surgery and as such we will hold off for now.  Follow.  4.  Acute kidney injury on chronic kidney disease stage IIIb, POA Avoid nephrotoxic agents.  Renal function improving with hydration.  Follow.  5.  Chronic anemia Likely secondary to chronic kidney disease.  Continue bicarb tablets.  Follow H&H.  6.  BPH Flomax.  7.  Hypothyroidism Continue Synthroid.  8.  Diabetes mellitus type 2 Hemoglobin A1c 7.6 (05/10/2019).  CBG of 91 this morning.  Place patient on D5 normal saline as patient noted to be n.p.o. this morning in anticipation of possible procedure.  Continue to hold oral hypoglycemic agents.  Sliding scale insulin.  9.  Nonobstructive coronary artery disease Stable.  Continue ACE inhibitor, beta-blocker, Lovaza.  10.  Diarrhea C. difficile PCR negative.  Continue probiotics.  Imodium as needed.   DVT prophylaxis: SCDs/patient with gross hematuria. Code Status: Full Family Communication: Updated patient.  No family at bedside. Disposition:   Status is: Inpatient    Dispo: The patient is from: Home              Anticipated d/c is to: To be determined however likely home with home health.              Anticipated d/c date is: To be determined              Patient currently admitted with probable septic hip, currently on IV antibiotics, also with gross hematuria, was on chronic anticoagulation for LV thrombus however currently on hold.  Consultations with orthopedics, ID and urology pending.        Consultants:   Orthopedics: Dr. Percell Miller pending  ID: Dr. Graylon Good pending  Urology: Dr. Louis Meckel pending  Procedures:     CT abdomen and pelvis 05/29/2019 pending  CT left hip 05/29/2019  Chest x-ray 05/28/2019  Plain films of the left hip 05/28/2019  Antimicrobials:   IV vancomycin 05/28/2019  IV cefepime 05/28/2019>>>> 05/29/2019  IV Flagyl 05/28/2019>>>> 05/29/2019  IV Rocephin 05/29/2019   Subjective: Foley catheter in place with hematuria noted.  Patient denies any chest pain or shortness of breath.  Patient complained of left hip pain states is about a 2-3/10.  Objective: Vitals:   05/29/19 1702 05/29/19 1730 05/29/19 1800 05/29/19 1830  BP: 139/65 (!) 121/55 (!) 128/59 (!) 159/63  Pulse: 93 83 89 87  Resp: (!) 26 (!) 22 20 20   Temp: 98.5 F (36.9 C)     TempSrc: Oral     SpO2: 98% 93% 97% 97%  Weight:        Intake/Output Summary (Last 24 hours) at 05/29/2019 1834 Last data filed at 05/29/2019 1717 Gross per 24 hour  Intake 3882.77 ml  Output 2500 ml  Net 1382.77 ml   Filed Weights   05/29/19 0144  Weight: 73.5 kg  Examination:  General exam: Appears calm and comfortable  Respiratory system: Clear to auscultation. Respiratory effort normal. Cardiovascular system: S1 & S2 heard, RRR. No JVD, murmurs, rubs, gallops or clicks. No pedal edema. Gastrointestinal system: Abdomen is nondistended, soft and nontender. No organomegaly or masses felt. Normal bowel sounds heard. Central nervous system: Alert and oriented. No focal neurological deficits. Extremities: Symmetric 5 x 5 power. Skin: No rashes, lesions or ulcers Psychiatry: Judgement and insight appear normal. Mood & affect appropriate.     Data Reviewed: I have personally reviewed following labs and imaging studies  CBC: Recent Labs  Lab 05/28/19 1300 05/29/19 0540  WBC 5.4 6.2  NEUTROABS 3.1  --   HGB 9.1* 8.0*  HCT 29.5* 25.8*  MCV 86.8 88.4  PLT 311 [id number]    Basic Metabolic Panel: Recent Labs  Lab 05/28/19 1300 05/29/19 0540  NA 132* 133*  K 4.8 4.2  CL 101 106  CO2 21* 19*  GLUCOSE 111* 99  BUN 30*  26*  CREATININE 1.62* 1.49*  CALCIUM 10.0 9.3    GFR: Estimated Creatinine Clearance: 31.5 mL/min (A) (by C-G formula based on SCr of 1.49 mg/dL (H)).  Liver Function Tests: Recent Labs  Lab 05/28/19 1300  AST 22  ALT 12  ALKPHOS 33*  BILITOT 0.5  PROT 6.3*  ALBUMIN 2.5*    CBG: Recent Labs  Lab 05/28/19 1125 05/29/19 0808 05/29/19 1226 05/29/19 1630  GLUCAP 84 91 95 152*     Recent Results (from the past 240 hour(s))  Respiratory Panel by RT PCR (Flu A&B, Covid) - Nasopharyngeal Swab     Status: None   Collection Time: 05/28/19  1:00 PM   Specimen: Nasopharyngeal Swab  Result Value Ref Range Status   SARS Coronavirus 2 by RT PCR NEGATIVE NEGATIVE Final    Comment: (NOTE) SARS-CoV-2 target nucleic acids are NOT DETECTED. The SARS-CoV-2 RNA is generally detectable in upper respiratoy specimens during the acute phase of infection. The lowest concentration of SARS-CoV-2 viral copies this assay can detect is 131 copies/mL. A negative result does not preclude SARS-Cov-2 infection and should not be used as the sole basis for treatment or other patient management decisions. A negative result may occur with  improper specimen collection/handling, submission of specimen other than nasopharyngeal swab, presence of viral mutation(s) within the areas targeted by this assay, and inadequate number of viral copies (<131 copies/mL). A negative result must be combined with clinical observations, patient history, and epidemiological information. The expected result is Negative. Fact Sheet for Patients:  PinkCheek.be Fact Sheet for Healthcare Providers:  GravelBags.it This test is not yet ap proved or cleared by the [place] FDA and  has been authorized for detection and/or diagnosis of SARS-CoV-2 by FDA under an Emergency Use Authorization (EUA). This EUA will remain  in effect (meaning this test can be used) for  the duration of the COVID-19 declaration under Section 564(b)(1) of the Act, 21 U.S.C. section 360bbb-3(b)(1), unless the authorization is terminated or revoked sooner.    Influenza A by PCR NEGATIVE NEGATIVE Final   Influenza B by PCR NEGATIVE NEGATIVE Final    Comment: (NOTE) The Xpert Xpress SARS-CoV-2/FLU/RSV assay is intended as an aid in  the diagnosis of influenza from Nasopharyngeal swab specimens and  should not be used as a sole basis for treatment. Nasal washings and  aspirates are unacceptable for Xpert Xpress SARS-CoV-2/FLU/RSV  testing. Fact Sheet for Patients: PinkCheek.be Fact Sheet for Healthcare Providers: GravelBags.it This test is not  yet approved or cleared by the [place] and  has been authorized for detection and/or diagnosis of SARS-CoV-2 by  FDA under an Emergency Use Authorization (EUA). This EUA will remain  in effect (meaning this test can be used) for the duration of the  Covid-19 declaration under Section 564(b)(1) of the Act, 21  U.S.C. section 360bbb-3(b)(1), unless the authorization is  terminated or revoked. Performed at [facility], [place] [address], [place], [place] [address]   Blood culture (routine x 2)     Status: None (Preliminary result)   Collection Time: 05/28/19  1:00 PM   Specimen: BLOOD  Result Value Ref Range Status   Specimen Description   Final    BLOOD RIGHT ANTECUBITAL Performed at [facility] [address], [place], [place] [address]    Special Requests   Final    BOTTLES DRAWN AEROBIC AND ANAEROBIC Blood Culture results may not be optimal due to an excessive volume of blood received in culture bottles Performed at [facility] [address] [place], [place] [address]    Culture   Final    NO GROWTH < 24 HOURS Performed at [facility] [address], [place], [place] [address]    Report  Status PENDING  Incomplete  Blood culture (routine x 2)     Status: None (Preliminary result)   Collection Time: 05/28/19  1:20 PM   Specimen: BLOOD  Result Value Ref Range Status   Specimen Description   Final    BLOOD LEFT ANTECUBITAL Performed at [facility] [address] [place], [place] [address]    Special Requests   Final    BOTTLES DRAWN AEROBIC AND ANAEROBIC Blood Culture adequate volume Performed at [facility] [address] [place], [place] [address]    Culture   Final    NO GROWTH < 24 HOURS Performed at [facility] [address], [place], [place] [address]    Report Status PENDING  Incomplete  Urine culture     Status: None   Collection Time: 05/28/19  4:27 PM   Specimen: In/Out Cath Urine  Result Value Ref Range Status   Specimen Description   Final    IN/OUT CATH URINE Performed at [facility] [address], [place], [place] [address]    Special Requests   Final    NONE Performed at [facility], [place] [address] [place], [place] [address]    Culture   Final    NO GROWTH Performed at [facility], [place] [address] [place], [place] [address]    Report Status 05/29/2019 FINAL  Final  C Difficile Quick Screen w PCR reflex     Status: None   Collection Time: 05/29/19 12:34 AM   Specimen: STOOL  Result Value Ref Range Status   C Diff antigen NEGATIVE NEGATIVE Final   C Diff toxin NEGATIVE NEGATIVE Final   C Diff interpretation No C. difficile detected.  Final    Comment: Performed at [facility], [place] [address] [place], [place] [address]  Body fluid culture     Status: None (Preliminary result)   Collection Time: 05/29/19  4:25 PM   Specimen: Abscess; Body Fluid  Result Value Ref Range Status   Specimen Description ABSCESS  Final   Special Requests NONE  Final   Gram Stain   Final    ABUNDANT WBC PRESENT,BOTH  PMN AND MONONUCLEAR NO  ORGANISMS SEEN Gram Stain Report Called to,Read Back By and Verified With: C.FRANKLIN AT 1736 ON 05/29/19 BY N.Jenessa Gillingham Performed at [facility], [place] [address], [place],  [address]    Culture PENDING  Incomplete   Report Status PENDING  Incomplete         Radiology Studies: CT ABDOMEN PELVIS WO CONTRAST  Result Date: 05/29/2019 CLINICAL DATA:  Hematuria EXAM: CT ABDOMEN AND PELVIS WITHOUT CONTRAST TECHNIQUE: Multidetector CT imaging of the abdomen and pelvis was performed following the standard protocol without IV contrast. COMPARISON:  None FINDINGS: Lower chest: Trace pleural effusions and bibasilar atelectasis. Hepatobiliary: No focal liver abnormality is seen. Status post cholecystectomy. No biliary dilatation. Pancreas: Unremarkable. Spleen: Unremarkable. Adrenals/Urinary Tract: Hyperdensity within the bladder likely reflecting hemorrhage. Air is present secondary to Foley catheter placement. Portions of the bladder are obscured by artifact. Left greater than right renal cortical scarring. No hydronephrosis. Stomach/Bowel: Small hiatal hernia. Stomach is otherwise unremarkable. Bowel is normal in caliber. Vascular/Lymphatic: Aortic atherosclerosis. No enlarged abdominal or pelvic lymph nodes. Reproductive: Obscured by artifact Other: No ascites. Musculoskeletal: Partially imaged collection of the anterior left hip better evaluated on concurrent dedicated imaging. Degenerative changes of the lumbar spine. Bilateral total hip arthroplasties. IMPRESSION: Moderate hemorrhage within the bladder.  Foley catheter is present. Electronically Signed   By: Macy Mis M.D.   On: 05/29/2019 11:55   CT HIP LEFT WO CONTRAST  Result Date: 05/29/2019 CLINICAL DATA:  Hip replacement, question of infection, high fevers EXAM: CT OF THE LEFT HIP WITHOUT CONTRAST TECHNIQUE: Multidetector CT imaging of the left hip was performed according to the standard protocol. Multiplanar CT image  reconstructions were also generated. COMPARISON:  May 28, 2019 radiograph, CT May 08, 2019 FINDINGS: Bones/Joint/Cartilage The patient is status post left total hip arthroplasty. No periprosthetic lucency or fracture is seen. No hip joint effusion is noted. The femoral head component is well seated within the acetabular component. Ligaments Suboptimally assessed by CT. Muscles and Tendons Head are within or adjacent to the tensor fascia lata there is a multilocular fluid collection containing foci of air measuring approximately 4.7 x 5.3 by 9.7 cm extending along the anterolateral leg. There is mild fatty atrophy of the remainder of the muscles surrounding the hip. Limited visualization of the tendons is seen. The previously seen multilocular collection along the posterior hip is no longer identified. Soft tissues There appears to be a hyperdense material layering in the posterior bladder which could represent layering blood products. The bladder appears to be markedly distended. Surgical suture seen along the lower left anterior inguinal wall. There is subcutaneous edema along the lateral aspect of the hip. IMPRESSION: 1. There remains a multilocular complex air and fluid collection within the anterior hip measuring approximately 4.7 x 5.3 x 9.7 cm extending along the anterior thigh, concerning for multilocular abscess. 2. Interval resolution of the posterior multilocular collection seen on prior CT of 4 9 2021. 3. Hyperdense probable blood products within the posterior bladder with a markedly distended bladder. Electronically Signed   By: Prudencio Pair M.D.   On: 05/29/2019 02:21   DG Chest Portable 1 View  Result Date: 05/28/2019 CLINICAL DATA:  Fever. EXAM: PORTABLE CHEST 1 VIEW COMPARISON:  None. FINDINGS: The heart size and mediastinal contours are within normal limits. Both lungs are clear. The visualized skeletal structures are unremarkable. IMPRESSION: No active disease. Electronically Signed   By:  Marijo Conception M.D.   On:  05/28/2019 13:22   DG Hip Unilat W or Wo Pelvis 2-3 Views Left  Result Date: 05/28/2019 CLINICAL DATA:  Postoperative left hip infection. EXAM: DG HIP (WITH OR WITHOUT PELVIS) 2-3V LEFT COMPARISON:  Fluoroscopic images of May 12, 2019. FINDINGS: The left femoral and acetabular components appear to be well situated. No fracture or dislocation is noted. Gas is noted in the surrounding soft tissues concerning for postoperative change or possibly infection. IMPRESSION: Status post left total hip arthroplasty. Gas is noted in surrounding soft tissues which may represent postoperative change, but infection cannot be excluded. Electronically Signed   By: Marijo Conception M.D.   On: 05/28/2019 13:23        Scheduled Meds: . cholecalciferol  5,000 Units Oral Daily  . fenofibrate  160 mg Oral Daily  . finasteride  5 mg Oral Daily  . heparin  5,000 Units Subcutaneous Q8H  . insulin aspart  0-9 Units Subcutaneous TID WC  . levothyroxine  25 mcg Oral QAC breakfast  . lisinopril  2.5 mg Oral Daily  . metoprolol succinate  25 mg Oral Daily  . multivitamin with minerals  1 tablet Oral Daily  . multivitamins with iron  1 tablet Oral Daily  . omega-3 acid ethyl esters  1 capsule Oral Daily  . sodium bicarbonate  650 mg Oral TID  . tamsulosin  0.4 mg Oral QPC supper   Continuous Infusions: . cefTRIAXone (ROCEPHIN)  IV    . dextrose 5 % and 0.9% NaCl 75 mL/hr at 05/29/19 1717  . vancomycin Stopped (05/29/19 1222)     LOS: 1 day    Time spent: 40 minutes    Irine Seal, MD [facility]   To contact the attending provider between 7A-7P or the covering provider during after hours 7P-7A, please log into the web site [email or web address] and access using universal [place] password for that web site. If you do not have the password, please call the hospital operator.  05/29/2019, 6:34 PM

## 2019-05-29 NOTE — ED Notes (Signed)
Irrigated patients foley with 60 mLs with only 25 return. Foley flowing. 600 mLs output after irrigation. Urine red.

## 2019-05-29 NOTE — Telephone Encounter (Signed)
I am seeing him in hospital

## 2019-05-29 NOTE — Consult Note (Signed)
I have been asked to see the patient by Dr. Milly Jakob, for evaluation and management of  hematuria.  History of present illness:  86 year old Casey [name] who presented to the emergency department today with hip pain.  He was found to have an abscess in his hip.  This is a recurring problem.  He had his hip replaced in November of 2020.  The patient is on anticoagulation because of a thrombus in his atrium.    The patient has a history of urinary retention and required of the catheter for short period of time after having his hip replaced initially last winter.  He was followed up in our clinic and passed his voiding trial.  He has an enlarged prostate with associated obstructive voiding symptoms baseline.    The patient was voiding relatively well leading up in to today, the CT scan demonstrated a large bladder.  A Foley catheter was placed and clot and bleeding was noted.  As such we were consulted.  The patient does have a long history of having had bladder cancer.  His cancer was treated in 2008  And had subsequent follow-up with no evidence of recurrence.    A CT scan was obtained in the emergency department today which demonstrated no evidence of kidney stones or large renal masses.  It was a noncontrast CT scan and as such limited.  There was evidence of clot within the patient's bladder.    Today currently the patient has  Pink tinged urine.  Review of systems: A 12 point comprehensive review of systems was obtained and is negative unless otherwise stated in the history of present illness.  Patient Active Problem List   Diagnosis Date Noted  . Fever 05/29/2019  . Hematuria, gross 05/29/2019  . Sepsis ([place]) 05/28/2019  . 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 Pressure injury of skin 05/11/2019  . Septic arthritis ([place]) 05/09/2019  . LV (left ventricular) mural thrombus 05/09/2019  . CKD (chronic kidney disease), stage III 05/09/2019  . Anemia 05/09/2019  . Primary  osteoarthritis of left hip 12/03/2018  . Guaiac + stool 06/28/2017  . Diarrhea 08/11/2012  . CAD (coronary artery disease) of artery bypass graft 08/11/2012  . Diabetes ([place]) 08/11/2012  . High cholesterol 08/11/2012    No current facility-administered medications on file prior to encounter.   Current Outpatient Medications on File Prior to Encounter  Medication Sig Dispense Refill  . acetaminophen (TYLENOL) 500 MG tablet Take 1,000 mg by mouth every 6 (six) hours as needed for moderate pain.    . Cholecalciferol (VITAMIN D3) 125 MCG (5000 UT) CAPS Take 1 capsule by mouth daily.    . Choline Fenofibrate (FENOFIBRIC ACID) 135 MG CPDR Take 135 mg by mouth daily.     Marland Kitchen Fe Bisgly-Vit C-Vit B12-FA (GENTLE IRON PO) Take 1 capsule by mouth daily.    . finasteride (PROSCAR) 5 MG tablet Take 5 mg by mouth daily.    Marland Kitchen glimepiride (AMARYL) 4 MG tablet Take 4 mg by mouth in the morning and at bedtime. Prescribed twice daily, patient may take once daily based on his BS level.    Marland Kitchen HYDROcodone-acetaminophen (NORCO) 5-325 MG tablet Take 1-2 tablets by mouth every 6 (six) hours as needed for severe pain (Use tylenol for mild and moderate pain.). 30 tablet 0  . levothyroxine (SYNTHROID) 25 MCG tablet Take 25 mcg by mouth daily before breakfast.    . lisinopril (ZESTRIL) 2.5 MG tablet Take 2.5 mg by mouth  daily.     . loratadine (CLARITIN) 10 MG tablet Take 10 mg by mouth daily as needed for allergies.    . metoprolol succinate (TOPROL-XL) 25 MG 24 hr tablet Take 25 mg by mouth daily.     . Omega-3 Fatty Acids (FISH OIL) 1000 MG CAPS Take 1 capsule by mouth daily.    . penicillin G IVPB Inject 24 Million Units into the vein continuous. 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 rivaroxaban (XARELTO) 10 MG  TABS tablet Take 10 mg by mouth daily.    . sodium bicarbonate 650 MG tablet Take 650 mg by mouth 3 (three) times daily.    . Tamsulosin HCl (FLOMAX) 0.4 MG CAPS Take 0.4 mg by mouth in the morning and at bedtime.     . baclofen (LIORESAL) 10 MG tablet Take 1 tablet (10 mg total) by mouth 2 (two) times daily. (Patient not taking: Reported on 05/28/2019) 20 each 0  . docusate sodium (COLACE) 100 MG capsule Take 1 capsule (100 mg total) by mouth 2 (two) times daily. To prevent constipation while taking pain medication. (Patient not taking: Reported on 05/11/2019) 20 capsule 0  . methocarbamol (ROBAXIN) 500 MG tablet Take 1 tablet (500 mg total) by mouth every 6 (six) hours as needed for muscle spasms. (Patient not taking: Reported on 05/28/2019) 20 tablet 0  . Multiple Vitamin (MULTIVITAMIN) tablet Take 1 tablet by mouth daily.    . ondansetron (ZOFRAN) 4 MG tablet Take 1 tablet (4 mg total) by mouth every 8 (eight) hours as needed for nausea or vomiting. (Patient not taking: Reported on 05/11/2019) 20 tablet 0    Past Medical History:  Diagnosis Date  . Anemia   . Arthritis    "all over" all joints  . Bladder cancer (H[facility])    bladder  . Blood transfusion 2008  . Chronic kidney disease    bladder ca in 2008;Kid. failure stage 3  . Complication of anesthesia    [place] post op  . Coronary artery disease   . CVA (cerebral vascular accident) ([place]) 03/04/2019  . Diabetes mellitus   . DJD (degenerative joint disease)   . Guaiac + stool 06/28/2017  . Heart murmur   . Hypothyroidism    hyper thyroidism recently  . Ileus ([place]) 2001   post hip replacement  . Metabolic acidosis   . Metabolic bone disease   . Myocardial infarction [facility]) age 71    Past Surgical History:  Procedure Laterality Date  . BLADDER SURGERY  2008  . CATARACT EXTRACTION W/ INTRAOCULAR LENS  IMPLANT, BILATERAL Bilateral   . CHOLECYSTECTOMY    . COLONOSCOPY  09/07/2010   Procedure: COLONOSCOPY;  Surgeon: Rogene Houston, MD;   Location: [facility]ITE;  Service: Endoscopy;  Laterality: N/A;  . CORONARY ANGIOPLASTY    . INGUINAL HERNIA REPAIR  2005  . TOTAL HIP ARTHROPLASTY Left 12/23/2018   Procedure: TOTAL HIP ARTHROPLASTY ANTERIOR APPROACH;  Surgeon: Renette Butters, MD;  Location: [facility] ORS;  Service: Orthopedics;  Laterality: Left;  . TOTAL HIP ARTHROPLASTY Left 05/12/2019   Procedure: irrigation and debridement septic hip headliner exchange;  Surgeon: Renette Butters, MD;  Location: [facility] ORS;  Service: Orthopedics;  Laterality: Left;  wants hana table  Social History   Tobacco Use  . Smoking status: Former Smoker    Quit date: 09/07/1990    Years since quitting: 28.7  . Smokeless tobacco: Never Used  Substance Use Topics  . Alcohol use: Yes    Comment: occasional  . Drug use: No    Family History  Problem Relation Age of Onset  . CAD Mother   . CAD Father     PE: Vitals:   05/29/19 1145 05/29/19 1201 05/29/19 1202 05/29/19 1203  BP:      Pulse: (!) 58 (!) 58 74 60  Resp: '17 18 Casey Casey  ' Temp:      TempSrc:      SpO2: 97% 95% 95% 96%  Weight:       Patient appears to be in no acute distress  patient is alert and oriented x3 Atraumatic normocephalic head No cervical or supraclavicular lymphadenopathy appreciated No increased work of breathing, no audible wheezes/rhonchi Regular sinus rhythm/rate Abdomen is soft, nontender, nondistended, no CVA or suprapubic tenderness Lower extremities are symmetric without appreciable edema Grossly neurologically intact No identifiable skin lesions  Recent Labs    05/28/19 1300 05/29/19 0540  WBC 5.4 6.2  HGB 9.1* 8.0*  HCT 29.5* 25.8*   Recent Labs    05/28/19 1300 05/29/19 0540  NA 132* 133*  K 4.8 4.2  CL 101 106  CO2 21* Casey*  GLUCOSE 111* 99  BUN 30* 26*  CREATININE 1.62* 1.49*  CALCIUM 10.0 9.3   Recent Labs    05/28/19 1300  INR 2.1*   No results for input(s): LABURIN in the last 72 hours. Results for orders placed or  performed during the hospital encounter of 05/28/19  Respiratory Panel by RT PCR (Flu A&B, Covid) - Nasopharyngeal Swab     Status: None   Collection Time: 05/28/19  1:00 PM   Specimen: Nasopharyngeal Swab  Result Value Ref Range Status   SARS Coronavirus 2 by RT PCR NEGATIVE NEGATIVE Final    Comment: (NOTE) SARS-CoV-2 target nucleic acids are NOT DETECTED. The SARS-CoV-2 RNA is generally detectable in upper respiratoy specimens during the acute phase of infection. The lowest concentration of SARS-CoV-2 viral copies this assay can detect is 131 copies/mL. A negative result does not preclude SARS-Cov-2 infection and should not be used as the sole basis for treatment or other patient management decisions. A negative result may occur with  improper specimen collection/handling, submission of specimen other than nasopharyngeal swab, presence of viral mutation(s) within the areas targeted by this assay, and inadequate number of viral copies (<131 copies/mL). A negative result must be combined with clinical observations, patient history, and epidemiological information. The expected result is Negative. Fact Sheet for Patients:  PinkCheek.be Fact Sheet for Healthcare Providers:  GravelBags.it This test is not yet ap proved or cleared by the [place] FDA and  has been authorized for detection and/or diagnosis of SARS-CoV-2 by FDA under an Emergency Use Authorization (EUA). This EUA will remain  in effect (meaning this test can be used) for the duration of the COVID-Casey declaration under Section 564(b)(1) of the Act, 21 U.S.C. section 360bbb-3(b)(1), unless the authorization is terminated or revoked sooner.    Influenza A by PCR NEGATIVE NEGATIVE Final   Influenza B by PCR NEGATIVE NEGATIVE Final    Comment: (NOTE) The Xpert Xpress SARS-CoV-2/FLU/RSV assay is intended as an aid in  the diagnosis of influenza from Nasopharyngeal  swab specimens and  should not be used as a sole  basis for treatment. Nasal washings and  aspirates are unacceptable for Xpert Xpress SARS-CoV-2/FLU/RSV  testing. Fact Sheet for Patients: PinkCheek.be Fact Sheet for Healthcare Providers: GravelBags.it This test is not yet approved or cleared by the [place] FDA and  has been authorized for detection and/or diagnosis of SARS-CoV-2 by  FDA under an Emergency Use Authorization (EUA). This EUA will remain  in effect (meaning this test can be used) for the duration of the  Covid-Casey declaration under Section 564(b)(1) of the Act, 21  U.S.C. section 360bbb-3(b)(1), unless the authorization is  terminated or revoked. Performed at [facility], [place] [address] [place], [place] [address]   C Difficile Quick Screen w PCR reflex     Status: None   Collection Time: 05/29/19 12:34 AM   Specimen: STOOL  Result Value Ref Range Status   C Diff antigen NEGATIVE NEGATIVE Final   C Diff toxin NEGATIVE NEGATIVE Final   C Diff interpretation No C. difficile detected.  Final    Comment: Performed at [facility], [place] [address] [place], [place] [address]    Imaging:   I reviewed the patient's CT scan which demonstrates some clot within the patient's bladder.  Otherwise largely unremarkable from a urologic perspective.  Imp:  The patient's hematuria is likely related to his traumatic catheterization while on anticoagulation.  His urine is now clearing.  He has a very large prostate and history of urinary retention.  Recommendations:  I would recommend that the patient keep his catheter in the short term and then follow up with [place] as an outpatient for Foley catheter removal.  At that time we can also perform cystoscopy to complete his hematuria evaluation.   Thank you for involving me in this patient's care, we will continue to follow along.   Ardis Hughs

## 2019-05-29 NOTE — ED Notes (Signed)
Grandville Silos, MD made aware of patient red, bloody urine and amount of output.  Awaiting urology consult.  No new orders at this time.

## 2019-05-29 NOTE — ED Notes (Signed)
Patient transported to CT 

## 2019-05-29 NOTE — ED Notes (Signed)
Per Grandville Silos, MD patient NPO except with meds. Patient to see ortho for possible infection in hip.   Per MD irrigate cath as needed.

## 2019-05-29 NOTE — Procedures (Signed)
I performed bedside ultrasound guided  aspiration of the left thigh abscess. 10 cc of blood tingled cloudy fluid obtained and sent for culture.  The aspiration was performed with sterile technique and local anesthesia (1% lidocaine). Mr. Casey [name] tolerated the procedure well. No immediate complications.

## 2019-05-29 NOTE — ED Notes (Signed)
Patient back from CT.

## 2019-05-29 NOTE — Consult Note (Signed)
[facility]  Total days of antibiotics 15               Reason for Consult:fevers while being treated for group b strep left hip pji and left thigh abscess    Referring Physician: Grandville Silos  Active Problems:   Sepsis ([place])   Fever   Hematuria, gross    HPI: Casey [name] is a 86 y.o. 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 He underwent aspiration of large fluid collection on 4/10 that isolated GBS, then taken to the OR for I x D and headliner exchange by dr Percell Miller on 4/15. He was discharged on continuous IV PCN. He returns to the ED on 4/29 after having multiple days of fevers and hyperglycemia. Temp of 102F and hypoglycemic to 29. Tachycardia. He was admitted for sepsis concern for ongoing hip infection vs picc line infection. Imaging of chest no pneumonia. ua does not appear consistent iwht uti. He had cdiff rule out. CT of thing shows large 5 x 5 x 10 cm multiloculated flid collection in anterolateral leg. Also has layering fluid collection in bladder possibly blood product. He was also noted to have hematuria, while on anticoagulation for stroke. Labs reveal increased inflammatory markers  Past Medical History:  Diagnosis Date  . Anemia   . Arthritis    "all over" all joints  . Bladder cancer (HCC)    bladder  . Blood transfusion 2008  . Chronic kidney disease    bladder ca in 2008;Kid. failure stage 3  . Complication of anesthesia    Ilios post op  . Coronary artery disease   . CVA (cerebral vascular accident) ([place]) 03/04/2019  . Diabetes mellitus   . DJD (degenerative joint disease)   . Guaiac + stool 06/28/2017  . Heart murmur   . Hypothyroidism    hyper thyroidism recently  . Ileus ([place]) 2001   post hip replacement  . Metabolic acidosis   . Metabolic bone disease   . Myocardial infarction [facility]) age 54    Allergies:  Allergies    Allergen Reactions  . Sulfa Antibiotics     unknown     MEDICATIONS: . cholecalciferol  5,000 Units Oral Daily  . fenofibrate  160 mg Oral Daily  . finasteride  5 mg Oral Daily  . heparin  5,000 Units Subcutaneous Q8H  . insulin aspart  0-9 Units Subcutaneous TID WC  . levothyroxine  25 mcg Oral QAC breakfast  . lisinopril  2.5 mg Oral Daily  . metoprolol succinate  25 mg Oral Daily  . multivitamin with minerals  1 tablet Oral Daily  . multivitamins with iron  1 tablet Oral Daily  . omega-3 acid ethyl esters  1 capsule Oral Daily  . sodium bicarbonate  650 mg Oral TID  . tamsulosin  0.4 mg Oral QPC supper    Social History   Tobacco Use  . Smoking status: Former Smoker    Quit date: 09/07/1990    Years since quitting: 28.7  . Smokeless tobacco: Never Used  Substance Use Topics  . Alcohol use: Yes    Comment: occasional  . Drug use: No    Family History  Problem Relation Age of Onset  . CAD Mother   . CAD Father     Review of Systems  Constitutional: positive for fever, chills, diaphoresis, activity change,  appetite change, fatigue and unexpected weight change.  HENT: Negative for congestion, sore throat, rhinorrhea, sneezing, trouble swallowing and sinus pressure.  Eyes: Negative for photophobia and visual disturbance.  Respiratory: Negative for cough, chest tightness, shortness of breath, wheezing and stridor.  Cardiovascular: Negative for chest pain, palpitations and leg swelling.  Gastrointestinal: Negative for nausea, vomiting, abdominal pain, diarrhea, constipation, blood in stool, abdominal distention and anal bleeding.  Genitourinary: Negative for dysuria, hematuria, flank pain and difficulty urinating.  Musculoskeletal: Negative for myalgias, back pain, joint swelling, arthralgias and gait problem.  Skin: Negative for color change, pallor, rash and wound.  Neurological: Negative for dizziness, tremors, weakness and light-headedness.  Hematological: Negative  for adenopathy. Does not bruise/bleed easily.  Psychiatric/Behavioral: Negative for behavioral problems, confusion, sleep disturbance, dysphoric mood, decreased concentration and agitation.   OBJECTIVE: Temp:  [99.1 F (37.3 C)] 99.1 F (37.3 C) (04/29 2000) Pulse Rate:  [57-116] 62 (04/30 1500) Resp:  [14-23] 17 (04/30 1500) BP: (108-167)/(52-108) 147/64 (04/30 1500) SpO2:  [91 %-100 %] 96 % (04/30 1500) Weight:  [73.5 kg] 73.5 kg (04/30 0144) Physical Exam  Constitutional: He is oriented to person, place, and time. He appears well-developed and well-nourished. No distress.  HENT:  Mouth/Throat: Oropharynx is clear and moist. No oropharyngeal exudate.  Cardiovascular: Normal rate, regular rhythm and normal heart sounds. Exam reveals no gallop and no friction rub.  No murmur heard.  Pulmonary/Chest: Effort normal and breath sounds normal. No respiratory distress. He has no wheezes.  Abdominal: Soft. Bowel sounds are normal. He exhibits no distension. There is no tenderness.  Ext: left hip incision, no signs of erythema or wound dehiscence. Some firmness below incision line, mild tenderness on palpation Neurological: He is alert and oriented to person, place, and time.  Skin: Skin is warm and dry. No rash noted. No erythema.  Psychiatric: He has a normal mood and affect. His behavior is normal.    LABS: Results for orders placed or performed during the hospital encounter of 05/28/19 (from the past 48 hour(s))  CBG monitoring, ED     Status: None   Collection Time: 05/28/19 11:25 AM  Result Value Ref Range   Glucose-Capillary 84 70 - 99 mg/dL    Comment: Glucose reference range applies only to samples taken after fasting for at least 8 hours.  CBC with Differential     Status: Abnormal   Collection Time: 05/28/19  1:00 PM  Result Value Ref Range   WBC 5.4 4.0 - 10.5 K/uL   RBC 3.40 (L) 4.22 - 5.81 MIL/uL   Hemoglobin 9.1 (L) 13.0 - 17.0 g/dL   HCT 29.5 (L) 39.0 - 52.0 %   MCV  86.8 80.0 - 100.0 fL   MCH 26.8 26.0 - 34.0 pg   MCHC 30.8 30.0 - 36.0 g/dL   RDW 15.9 (H) 11.5 - 15.5 %   Platelets 311 150 - 400 K/uL   nRBC 0.0 0.0 - 0.2 %   Neutrophils Relative % 56 %   Neutro Abs 3.1 1.7 - 7.7 K/uL   Lymphocytes Relative 24 %   Lymphs Abs 1.3 0.7 - 4.0 K/uL   Monocytes Relative 10 %   Monocytes Absolute 0.5 0.1 - 1.0 K/uL   Eosinophils Relative 7 %   Eosinophils Absolute 0.4 0.0 - 0.5 K/uL   Basophils Relative 1 %   Basophils Absolute 0.0 0.0 - 0.1 K/uL   Immature Granulocytes 2 %   Abs Immature Granulocytes 0.11 (H) 0.00 - 0.07 K/uL  Comment: Performed at [facility], [place] [address], [place], [place] [address]  Comprehensive metabolic panel     Status: Abnormal   Collection Time: 05/28/19  1:00 PM  Result Value Ref Range   Sodium 132 (L) 135 - 145 mmol/L   Potassium 4.8 3.5 - 5.1 mmol/L   Chloride 101 98 - 111 mmol/L   CO2 21 (L) 22 - 32 mmol/L   Glucose, Bld 111 (H) 70 - 99 mg/dL    Comment: Glucose reference range applies only to samples taken after fasting for at least 8 hours.   BUN 30 (H) 8 - 23 mg/dL   Creatinine, Ser 1.62 (H) 0.61 - 1.24 mg/dL   Calcium 10.0 8.9 - 10.3 mg/dL   Total Protein 6.3 (L) 6.5 - 8.1 g/dL   Albumin 2.5 (L) 3.5 - 5.0 g/dL   AST 22 15 - 41 U/L   ALT 12 0 - 44 U/L   Alkaline Phosphatase 33 (L) 38 - 126 U/L   Total Bilirubin 0.5 0.3 - 1.2 mg/dL   GFR calc non Af Amer 38 (L) >60 mL/min   GFR calc Af Amer 44 (L) >60 mL/min   Anion gap 10 5 - 15    Comment: Performed at [facility], [place] [address], [place], [place] [address]  Lipase, blood     Status: Abnormal   Collection Time: 05/28/19  1:00 PM  Result Value Ref Range   Lipase 64 (H) 11 - 51 U/L    Comment: Performed at [facility], [place] [address] [place], [place] [address]  Lactic acid, plasma     Status: None   Collection Time: 05/28/19  1:00 PM  Result Value Ref Range   Lactic Acid, Venous 1.9 0.5 - 1.9  mmol/L    Comment: Performed at [facility], [place] [address], [place], [place] [address]  Respiratory Panel by RT PCR (Flu A&B, Covid) - Nasopharyngeal Swab     Status: None   Collection Time: 05/28/19  1:00 PM   Specimen: Nasopharyngeal Swab  Result Value Ref Range   SARS Coronavirus 2 by RT PCR NEGATIVE NEGATIVE    Comment: (NOTE) SARS-CoV-2 target nucleic acids are NOT DETECTED. The SARS-CoV-2 RNA is generally detectable in upper respiratoy specimens during the acute phase of infection. The lowest concentration of SARS-CoV-2 viral copies this assay can detect is 131 copies/mL. A negative result does not preclude SARS-Cov-2 infection and should not be used as the sole basis for treatment or other patient management decisions. A negative result may occur with  improper specimen collection/handling, submission of specimen other than nasopharyngeal swab, presence of viral mutation(s) within the areas targeted by this assay, and inadequate number of viral copies (<131 copies/mL). A negative result must be combined with clinical observations, patient history, and epidemiological information. The expected result is Negative. Fact Sheet for Patients:  PinkCheek.be Fact Sheet for Healthcare Providers:  GravelBags.it This test is not yet ap proved or cleared by the [place] FDA and  has been authorized for detection and/or diagnosis of SARS-CoV-2 by FDA under an Emergency Use Authorization (EUA). This EUA will remain  in effect (meaning this test can be used) for the duration of the COVID-19 declaration under Section 564(b)(1) of the Act, 21 U.S.C. section 360bbb-3(b)(1), unless the authorization is terminated or revoked sooner.    Influenza A by PCR NEGATIVE NEGATIVE   Influenza B by PCR NEGATIVE NEGATIVE    Comment: (NOTE) The  Xpert Xpress SARS-CoV-2/FLU/RSV assay is intended as an aid in  the diagnosis  of influenza from Nasopharyngeal swab specimens and  should not be used as a sole basis for treatment. Nasal washings and  aspirates are unacceptable for Xpert Xpress SARS-CoV-2/FLU/RSV  testing. Fact Sheet for Patients: PinkCheek.be Fact Sheet for Healthcare Providers: GravelBags.it This test is not yet approved or cleared by the [place] FDA and  has been authorized for detection and/or diagnosis of SARS-CoV-2 by  FDA under an Emergency Use Authorization (EUA). This EUA will remain  in effect (meaning this test can be used) for the duration of the  Covid-19 declaration under Section 564(b)(1) of the Act, 21  U.S.C. section 360bbb-3(b)(1), unless the authorization is  terminated or revoked. Performed at [facility], [place] [address], [place], [place] [address]   Blood culture (routine x 2)     Status: None (Preliminary result)   Collection Time: 05/28/19  1:00 PM   Specimen: BLOOD  Result Value Ref Range   Specimen Description      BLOOD RIGHT ANTECUBITAL Performed at [facility] [address] [place], [place] [address]    Special Requests      BOTTLES DRAWN AEROBIC AND ANAEROBIC Blood Culture results may not be optimal due to an excessive volume of blood received in culture bottles Performed at [facility] [address], [place], [place] [address]    Culture      NO GROWTH < 24 HOURS Performed at [facility] [address], [place], [place] [address]    Report Status PENDING   Sedimentation rate     Status: Abnormal   Collection Time: 05/28/19  1:00 PM  Result Value Ref Range   Sed Rate 75 (H) 0 - 16 mm/hr    Comment: Performed at [facility], [place] [address] [place], [place] [address]  C-reactive protein     Status: Abnormal   Collection Time: 05/28/19  1:00 PM  Result Value Ref Range   CRP 13.2 (H) <1.0 mg/dL     Comment: Performed at [facility], [place] [address] [place], [place] [address]  APTT     Status: Abnormal   Collection Time: 05/28/19  1:00 PM  Result Value Ref Range   aPTT 67 (H) 24 - 36 seconds    Comment:        IF BASELINE aPTT IS ELEVATED, SUGGEST PATIENT RISK ASSESSMENT BE USED TO DETERMINE APPROPRIATE ANTICOAGULANT THERAPY. Performed at [facility], [place] [address] [place], [place] [address]   Protime-INR     Status: Abnormal   Collection Time: 05/28/19  1:00 PM  Result Value Ref Range   Prothrombin Time 22.6 (H) 11.4 - 15.2 seconds   INR 2.1 (H) 0.8 - 1.2    Comment: (NOTE) INR goal varies based on device and disease states. Performed at [facility], [place] [address] [place], [place] [address]   Blood culture (routine x 2)     Status: None (Preliminary result)   Collection Time: 05/28/19  1:20 PM   Specimen: BLOOD  Result Value Ref Range   Specimen Description      BLOOD LEFT ANTECUBITAL Performed at [facility] [address] [place], [place] [address]    Special Requests      BOTTLES DRAWN AEROBIC AND ANAEROBIC Blood Culture adequate volume Performed at [place] [place], [place]  [address]    Culture      NO GROWTH < 24 HOURS Performed at [facility] [address], [place], [place] [address]    Report Status PENDING   Urinalysis, Routine w reflex microscopic     Status: Abnormal   Collection Time: 05/28/19  4:27 PM  Result Value Ref Range   Color, Urine YELLOW YELLOW   APPearance CLEAR CLEAR   Specific Gravity, Urine 1.012 1.005 - 1.030   pH 7.0 5.0 - 8.0   Glucose, UA NEGATIVE NEGATIVE mg/dL   Hgb urine dipstick MODERATE (A) NEGATIVE   Bilirubin Urine NEGATIVE NEGATIVE   Ketones, ur NEGATIVE NEGATIVE mg/dL   Protein, ur NEGATIVE NEGATIVE mg/dL   Nitrite NEGATIVE NEGATIVE   Leukocytes,Ua NEGATIVE NEGATIVE   RBC / HPF >50 (H) 0  - 5 RBC/hpf   WBC, UA 0-5 0 - 5 WBC/hpf   Bacteria, UA NONE SEEN NONE SEEN    Comment: Performed at [facility], [place] [address] [place], [place] [address]  C Difficile Quick Screen w PCR reflex     Status: None   Collection Time: 05/29/19 12:34 AM   Specimen: STOOL  Result Value Ref Range   C Diff antigen NEGATIVE NEGATIVE   C Diff toxin NEGATIVE NEGATIVE   C Diff interpretation No C. difficile detected.     Comment: Performed at [facility], [place] [address], [place], [place] [id number]  Basic metabolic panel     Status: Abnormal   Collection Time: 05/29/19  5:40 AM  Result Value Ref Range   Sodium 133 (L) 135 - 145 mmol/L   Potassium 4.2 3.5 - 5.1 mmol/L   Chloride 106 98 - 111 mmol/L   CO2 19 (L) 22 - 32 mmol/L   Glucose, Bld 99 70 - 99 mg/dL    Comment: Glucose reference range applies only to samples taken after fasting for at least 8 hours.   BUN 26 (H) 8 - 23 mg/dL   Creatinine, Ser 1.49 (H) 0.61 - 1.24 mg/dL   Calcium 9.3 8.9 - 10.3 mg/dL   GFR calc non Af Amer 42 (L) >60 mL/min   GFR calc Af Amer 48 (L) >60 mL/min   Anion gap 8 5 - 15    Comment: Performed at [facility], [place] [address] [place], [place] [address]  CBC     Status: Abnormal   Collection Time: 05/29/19  5:40 AM  Result Value Ref Range   WBC 6.2 4.0 - 10.5 K/uL   RBC 2.92 (L) 4.22 - 5.81 MIL/uL   Hemoglobin 8.0 (L) 13.0 - 17.0 g/dL   HCT 25.8 (L) 39.0 - 52.0 %   MCV 88.4 80.0 - 100.0 fL   MCH 27.4 26.0 - 34.0 pg   MCHC 31.0 30.0 - 36.0 g/dL   RDW 15.9 (H) 11.5 - 15.5 %   Platelets 279 150 - 400 K/uL   nRBC 0.0 0.0 - 0.2 %    Comment: Performed at [facility], [place] [address], [place],  [address]  CBG monitoring, ED     Status: None   Collection Time: 05/29/19  8:08 AM  Result Value Ref Range   Glucose-Capillary 91 70 - 99 mg/dL    Comment: Glucose reference range applies only to samples taken after fasting for at  least 8 hours.  CBG monitoring, ED     Status: None   Collection Time: 05/29/19 12:26 PM  Result Value  Ref Range   Glucose-Capillary 95 70 - 99 mg/dL    Comment: Glucose reference range applies only to samples taken after fasting for at least 8 hours.    MICRO: 4/29 blood cx  Pending 4/29 urine cx pending IMAGING: CT ABDOMEN PELVIS WO CONTRAST  Result Date: 05/29/2019 CLINICAL DATA:  Hematuria EXAM: CT ABDOMEN AND PELVIS WITHOUT CONTRAST TECHNIQUE: Multidetector CT imaging of the abdomen and pelvis was performed following the standard protocol without IV contrast. COMPARISON:  None FINDINGS: Lower chest: Trace pleural effusions and bibasilar atelectasis. Hepatobiliary: No focal liver abnormality is seen. Status post cholecystectomy. No biliary dilatation. Pancreas: Unremarkable. Spleen: Unremarkable. Adrenals/Urinary Tract: Hyperdensity within the bladder likely reflecting hemorrhage. Air is present secondary to Foley catheter placement. Portions of the bladder are obscured by artifact. Left greater than right renal cortical scarring. No hydronephrosis. Stomach/Bowel: Small hiatal hernia. Stomach is otherwise unremarkable. Bowel is normal in caliber. Vascular/Lymphatic: Aortic atherosclerosis. No enlarged abdominal or pelvic lymph nodes. Reproductive: Obscured by artifact Other: No ascites. Musculoskeletal: Partially imaged collection of the anterior left hip better evaluated on concurrent dedicated imaging. Degenerative changes of the lumbar spine. Bilateral total hip arthroplasties. IMPRESSION: Moderate hemorrhage within the bladder.  Foley catheter is present. Electronically Signed   By: Macy Mis M.D.   On: 05/29/2019 11:55   CT HIP LEFT WO CONTRAST  Result Date: 05/29/2019 CLINICAL DATA:  Hip replacement, question of infection, high fevers EXAM: CT OF THE LEFT HIP WITHOUT CONTRAST TECHNIQUE: Multidetector CT imaging of the left hip was performed according to the standard protocol.  Multiplanar CT image reconstructions were also generated. COMPARISON:  May 28, 2019 radiograph, CT May 08, 2019 FINDINGS: Bones/Joint/Cartilage The patient is status post left total hip arthroplasty. No periprosthetic lucency or fracture is seen. No hip joint effusion is noted. The femoral head component is well seated within the acetabular component. Ligaments Suboptimally assessed by CT. Muscles and Tendons Head are within or adjacent to the tensor fascia lata there is a multilocular fluid collection containing foci of air measuring approximately 4.7 x 5.3 by 9.7 cm extending along the anterolateral leg. There is mild fatty atrophy of the remainder of the muscles surrounding the hip. Limited visualization of the tendons is seen. The previously seen multilocular collection along the posterior hip is no longer identified. Soft tissues There appears to be a hyperdense material layering in the posterior bladder which could represent layering blood products. The bladder appears to be markedly distended. Surgical suture seen along the lower left anterior inguinal wall. There is subcutaneous edema along the lateral aspect of the hip. IMPRESSION: 1. There remains a multilocular complex air and fluid collection within the anterior hip measuring approximately 4.7 x 5.3 x 9.7 cm extending along the anterior thigh, concerning for multilocular abscess. 2. Interval resolution of the posterior multilocular collection seen on prior CT of 4 9 2021. 3. Hyperdense probable blood products within the posterior bladder with a markedly distended bladder. Electronically Signed   By: Prudencio Pair M.D.   On: 05/29/2019 02:21   DG Chest Portable 1 View  Result Date: 05/28/2019 CLINICAL DATA:  Fever. EXAM: PORTABLE CHEST 1 VIEW COMPARISON:  None. FINDINGS: The heart size and mediastinal contours are within normal limits. Both lungs are clear. The visualized skeletal structures are unremarkable. IMPRESSION: No active disease.  Electronically Signed   By: Marijo Conception M.D.   On: 05/28/2019 13:22   DG Hip Unilat W or Wo Pelvis 2-3 Views Left  Result Date: 05/28/2019  CLINICAL DATA:  Postoperative left hip infection. EXAM: DG HIP (WITH OR WITHOUT PELVIS) 2-3V LEFT COMPARISON:  Fluoroscopic images of May 12, 2019. FINDINGS: The left femoral and acetabular components appear to be well situated. No fracture or dislocation is noted. Gas is noted in the surrounding soft tissues concerning for postoperative change or possibly infection. IMPRESSION: Status post left total hip arthroplasty. Gas is noted in surrounding soft tissues which may represent postoperative change, but infection cannot be excluded. Electronically Signed   By: Marijo Conception M.D.   On: 05/28/2019 13:23   Assessment/Plan:  86yo M with left hip streptococcal prosthetic joint infection and left thigh abscess in mid April currently on penicillin IV but in the last week having high fevers and hyper/hypoglycemia concerning for ongoing fevers. Imaging suggests large fluid collection to anterior hip unclear if abscess vs infected hematoma vs. Hematoma. I have strong suspicion for infection, not having source control  Please get IR to place drain and/or sample fluid  In anterior hip. Send for aerobic/anaerobic culture Can keep on current regimen, which will do vancomycin and ceftriaxone, until culture results return, then narrow appropriately If ant hip fluid collection is c/w abscess, then he will need I and d; still may need drainage of large fluid collection since it can be nidus for infection To be evaluated by dr Alvan Dame tomorrow If blood cx are positive, he will need picc line removed  More recs over the weekend. Dr Lucianne Lei dam to see.  Elzie Rings [facility] [phone]

## 2019-05-29 NOTE — Consult Note (Addendum)
ORTHOPAEDIC CONSULTATION  REQUESTING PHYSICIAN: Eugenie Filler, MD  Chief Complaint: Fever, sepsis in the setting of group B strep PJI left hip, 2 weeks status post irrigation, debridement, polyexchange by Dr. Percell Miller.  HPI: Casey [name] is a 86 y.o. male who complains of fever over the past week.  He has been compliant with antibiotic therapy via IV per ID recommendations.  He denies significant left hip pain.  He has been ambulating and bearing weight until admission.  Sepsis work-up initiated has not shown sign of UA, pneumonia, or C. difficile.  CT scan showed multilocular complex air and fluid collection within the anterior hip measuring approximately 4.7 x 5.3 x 9.7 cm extending along the anterior thigh, concerning for multilocular abscess.  Orthopedics was consulted for evaluation.     Past Medical History:  Diagnosis Date  . Anemia   . Arthritis    "all over" all joints  . Bladder cancer (HCC)    bladder  . Blood transfusion 2008  . Chronic kidney disease    bladder ca in 2008;Kid. failure stage 3  . Complication of anesthesia    Ilios post op  . Coronary artery disease   . CVA (cerebral vascular accident) ([place]) 03/04/2019  . Diabetes mellitus   . DJD (degenerative joint disease)   . Guaiac + stool 06/28/2017  . Heart murmur   . Hypothyroidism    hyper thyroidism recently  . Ileus ([place]) 2001   post hip replacement  . Metabolic acidosis   . Metabolic bone disease   . Myocardial infarction [facility]) age 44   Past Surgical History:  Procedure Laterality Date  . BLADDER SURGERY  2008  . CATARACT EXTRACTION W/ INTRAOCULAR LENS  IMPLANT, BILATERAL Bilateral   . CHOLECYSTECTOMY    . COLONOSCOPY  09/07/2010   Procedure: COLONOSCOPY;  Surgeon: Rogene Houston, MD;  Location: [facility];  Service: Endoscopy;  Laterality: N/A;  . CORONARY ANGIOPLASTY    . INGUINAL HERNIA REPAIR  2005  . TOTAL HIP ARTHROPLASTY Left 12/23/2018   Procedure: TOTAL HIP ARTHROPLASTY  ANTERIOR APPROACH;  Surgeon: Renette Butters, MD;  Location: [facility] OR[facility];  Service: Orthopedics;  Laterality: Left;  . TOTAL HIP ARTHROPLASTY Left 05/12/2019   Procedure: irrigation and debridement septic hip headliner exchange;  Surgeon: Renette Butters, MD;  Location: [facility] ORS;  Service: Orthopedics;  Laterality: Left;  wants hana table   Social History   Socioeconomic History  . Marital status: Widowed    Spouse name: Not on file  . Number of children: Not on file  . Years of education: Not on file  . Highest education level: Not on file  Occupational History  . Not on file  Tobacco Use  . Smoking status: Former Smoker    Quit date: 09/07/1990    Years since quitting: 28.7  . Smokeless tobacco: Never Used  Substance and Sexual Activity  . Alcohol use: Yes    Comment: occasional  . Drug use: No  . Sexual activity: Not on file  Other Topics Concern  . Not on file  Social History Narrative  . Not on file   Social Determinants of Health   Financial Resource Strain:   . Difficulty of Paying Living Expenses:   Food Insecurity:   . Worried About Charity fundraiser in the Last Year:   . Arboriculturist in the Last Year:   Transportation Needs:   . Film/video editor (Medical):   Marland Kitchen  Lack of Transportation (Non-Medical):   Physical Activity:   . Days of Exercise per Week:   . Minutes of Exercise per Session:   Stress:   . Feeling of Stress :   Social Connections:   . Frequency of Communication with Friends and Family:   . Frequency of Social Gatherings with Friends and Family:   . Attends Religious Services:   . Active Member of Clubs or Organizations:   . Attends Archivist Meetings:   Marland Kitchen Marital Status:    Family History  Problem Relation Age of Onset  . CAD Mother   . CAD Father    Allergies  Allergen Reactions  . Sulfa Antibiotics     unknown   Prior to Admission medications   Medication Sig Start Date End Date Taking? Authorizing Provider    acetaminophen (TYLENOL) 500 MG tablet Take 1,000 mg by mouth every 6 (six) hours as needed for moderate pain.   Yes [provider]  Cholecalciferol (VITAMIN D3) 125 MCG (5000 UT) CAPS Take 1 capsule by mouth daily.   Yes [provider]  Choline Fenofibrate (FENOFIBRIC ACID) 135 MG CPDR Take 135 mg by mouth daily.    Yes [provider]  Fe Bisgly-Vit C-Vit B12-FA (GENTLE IRON PO) Take 1 capsule by mouth daily.   Yes [provider]  finasteride (PROSCAR) 5 MG tablet Take 5 mg by mouth daily. 11/18/18  Yes [provider]  glimepiride (AMARYL) 4 MG tablet Take 4 mg by mouth in the morning and at bedtime. Prescribed twice daily, patient may take once daily based on his BS level.   Yes [provider]  HYDROcodone-acetaminophen (NORCO) 5-325 MG tablet Take 1-2 tablets by mouth every 6 (six) hours as needed for severe pain (Use tylenol for mild and moderate pain.). 05/12/19  Yes Martensen, Charna Elizabeth III, PA-C  levothyroxine (SYNTHROID) 25 MCG tablet Take 25 mcg by mouth daily before breakfast.   Yes [provider]  lisinopril (ZESTRIL) 2.5 MG tablet Take 2.5 mg by mouth daily.    Yes [provider]  loratadine (CLARITIN) 10 MG tablet Take 10 mg by mouth daily as needed for allergies.   Yes [provider]  metoprolol succinate (TOPROL-XL) 25 MG 24 hr tablet Take 25 mg by mouth daily.    Yes [provider]  Omega-3 Fatty Acids (FISH OIL) 1000 MG CAPS Take 1 capsule by mouth daily.   Yes [provider]  penicillin G IVPB Inject 24 Million Units into the vein continuous. 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   Yes [provider]  sodium bicarbonate 650 MG tablet Take 650 mg by mouth 3 (three) times daily.   Yes [provider]  Tamsulosin HCl (FLOMAX) 0.4 MG CAPS Take 0.4 mg by mouth in the morning and at bedtime.    Yes [provider]  baclofen (LIORESAL) 10 MG tablet Take 1 tablet (10 mg total) by mouth 2 (two) times daily. Patient not taking: Reported on 05/28/2019 05/12/19   Prudencio Burly III, PA-C  docusate sodium (COLACE) 100 MG capsule Take 1 capsule (100 mg total) by mouth 2 (two) times daily. To  prevent constipation while taking pain medication. Patient not taking: Reported on 05/11/2019 12/23/18   Prudencio Burly III, PA-C  methocarbamol (ROBAXIN) 500 MG tablet Take 1 tablet (500 mg total) by mouth every 6 (six) hours as needed for muscle spasms. Patient not taking: Reported on 05/28/2019 05/14/19   Flora Lipps, MD  Multiple Vitamin (MULTIVITAMIN) tablet Take 1 tablet by mouth daily.    [provider]  ondansetron (ZOFRAN) 4 MG tablet Take 1 tablet (4 mg total) by mouth every 8 (eight) hours as needed for nausea or vomiting. Patient not taking: Reported on 05/11/2019 12/23/18   Prudencio Burly III, PA-C   CT ABDOMEN PELVIS WO CONTRAST  Result Date: 05/29/2019 CLINICAL DATA:  Hematuria EXAM: CT ABDOMEN AND PELVIS WITHOUT CONTRAST TECHNIQUE: Multidetector CT imaging of the abdomen and pelvis was performed following the standard protocol without IV contrast. COMPARISON:  None FINDINGS: Lower chest: Trace pleural effusions and bibasilar atelectasis. Hepatobiliary: No focal liver abnormality is seen. Status post cholecystectomy. No biliary dilatation. Pancreas: Unremarkable. Spleen: Unremarkable. Adrenals/Urinary Tract: Hyperdensity within the bladder likely reflecting hemorrhage. Air is present secondary to Foley catheter placement. Portions of the bladder are obscured by artifact. Left greater than right renal cortical scarring.  No hydronephrosis. Stomach/Bowel: Small hiatal hernia. Stomach is otherwise unremarkable. Bowel is normal in caliber. Vascular/Lymphatic: Aortic atherosclerosis. No enlarged abdominal or pelvic lymph nodes. Reproductive: Obscured by artifact Other: No ascites. Musculoskeletal: Partially imaged collection of the anterior left hip better evaluated on concurrent dedicated imaging. Degenerative changes of the lumbar spine. Bilateral total hip arthroplasties. IMPRESSION: Moderate hemorrhage within the bladder.  Foley catheter is present. Electronically Signed   By: Macy Mis M.D.   On: 05/29/2019 11:55   CT HIP LEFT WO CONTRAST  Result Date: 05/29/2019 CLINICAL DATA:  Hip replacement, question of infection, high fevers EXAM: CT OF THE LEFT HIP WITHOUT CONTRAST TECHNIQUE: Multidetector CT imaging of the left hip was performed according to the standard protocol. Multiplanar CT image reconstructions were also generated. COMPARISON:  May 28, 2019 radiograph, CT May 08, 2019 FINDINGS: Bones/Joint/Cartilage The patient is status post left total hip arthroplasty. No periprosthetic lucency or fracture is seen. No hip joint effusion is noted. The femoral head component is well seated within the acetabular component. Ligaments Suboptimally assessed by CT. Muscles and Tendons Head are within or adjacent to the tensor fascia lata there is a multilocular fluid collection containing foci of air measuring approximately 4.7 x 5.3 by 9.7 cm extending along the anterolateral leg. There is mild fatty atrophy of the remainder of the muscles surrounding the hip. Limited visualization of the tendons is seen. The previously seen multilocular collection along the posterior hip is no longer identified. Soft tissues There appears to be a hyperdense material layering in the posterior bladder which could represent layering blood products. The bladder appears to be markedly distended. Surgical suture seen along the lower left anterior  inguinal wall. There is subcutaneous edema along the lateral aspect of the hip. IMPRESSION: 1. There remains a multilocular complex air and fluid collection within the anterior hip measuring approximately 4.7 x 5.3 x 9.7 cm extending along the anterior thigh, concerning for multilocular abscess. 2. Interval resolution of the posterior multilocular collection seen on prior CT of 4 9 2021. 3. Hyperdense probable blood products within the posterior bladder with a markedly distended bladder. Electronically Signed   By: Prudencio Pair M.D.   On: 05/29/2019 02:21   DG Chest Portable 1 View  Result Date:  05/28/2019 CLINICAL DATA:  Fever. EXAM: PORTABLE CHEST 1 VIEW COMPARISON:  None. FINDINGS: The heart size and mediastinal contours are within normal limits. Both lungs are clear. The visualized skeletal structures are unremarkable. IMPRESSION: No active disease. Electronically Signed   By: Marijo Conception M.D.   On: 05/28/2019 13:22   DG Hip Unilat W or Wo Pelvis 2-3 Views Left  Result Date: 05/28/2019 CLINICAL DATA:  Postoperative left hip infection. EXAM: DG HIP (WITH OR WITHOUT PELVIS) 2-3V LEFT COMPARISON:  Fluoroscopic images of May 12, 2019. FINDINGS: The left femoral and acetabular components appear to be well situated. No fracture or dislocation is noted. Gas is noted in the surrounding soft tissues concerning for postoperative change or possibly infection. IMPRESSION: Status post left total hip arthroplasty. Gas is noted in surrounding soft tissues which may represent postoperative change, but infection cannot be excluded. Electronically Signed   By: Marijo Conception M.D.   On: 05/28/2019 13:23    Positive ROS: All other systems have been reviewed and were otherwise negative with the exception of those mentioned in the HPI and as above.  Objective: Labs cbc Recent Labs    05/28/19 1300 05/29/19 0540  WBC 5.4 6.2  HGB 9.1* 8.0*  HCT 29.5* 25.8*  PLT 311 279    Labs inflam Recent Labs     05/28/19 1300  CRP 13.2*    Labs coag Recent Labs    05/28/19 1300  INR 2.1*    Recent Labs    05/28/19 1300 05/29/19 0540  NA 132* 133*  K 4.8 4.2  CL 101 106  CO2 21* 19*  GLUCOSE 111* 99  BUN 30* 26*  CREATININE 1.62* 1.49*  CALCIUM 10.0 9.3    Physical Exam: Vitals:   05/29/19 1400 05/29/19 1430  BP: (!) 142/57 (!) 148/76  Pulse: 60 (!) 59  Resp: 19 15  Temp:    SpO2: 96% 99%   General: Alert, no acute distress.  Supine in bed.  Calm, conversant. Mental status: Alert and Oriented x3 Neurologic: Speech Clear and organized, no gross focal findings or movement disorder appreciated. Respiratory: No cyanosis, no use of accessory musculature Cardiovascular: No pedal edema GI: Abdomen is soft and non-tender, non-distended. Skin: Warm and dry.   Extremities: Warm and well perfused w/o edema Psychiatric: Patient is competent for consent with normal mood and affect  MUSCULOSKELETAL:  LLE: Patient tolerates active and passive range of motion.  Slight lateral hip soreness with external rotation of the hip.  Surgical incision anterior lateral left thigh well-healing without sign of infection.  There are some mild tenderness posterior to the greater trochanter.  No erythema or outward sign of infection.  EHL, FHL, dorsiflexion, plantarflexion intact.  Sensation intact distally. Other extremities are atraumatic with painless ROM and NVI.  Assessment / Plan: Active Problems:   Sepsis ([place])   Fever   Hematuria, gross  Fluid collection left hip  S/P Left Total Hip Arthroplasty with Group b streptococcus left hip PJI 2 weeks status post irrigation, debridement, polyexchange  Sepsis on arrival without evidence of UA or pneumonia.  C. difficile negative.  Blood cultures pending.  Concern for recurrent/persistent left hip infection.  Recommend ultrasound aspiration of fluid collection with analysis.  If fluid collection is suggests infection, he may need additional  irrigation and debridement and possibly staged surgery/explant.    Dr. Percell Miller has discussed case with Mardelle Matte who will be on-call the weekend if patient needs urgent I&D for sepsis.  Due  to the complex nature of this problem and possible need for staged surgery/explant, Dr. Percell Miller has also discussed case with Dr. Alvan Dame.   N.p.o. after midnight pending ultrasound aspiration results.  Xarelto held dt possible surgery.  Continue care for comorbid conditions per medicine team  Continue antibiotic therapy per ID.  Weightbearing: WBAT LLE Insicional and dressing care: open to air Showering: okay VTE prophylaxis: Xarelto held pending possible surgical intervention.  Details of this assessment along with proposed plan were discussed at length with the patient today.  He verbalizes understanding of this and agrees.  Percell Miller will also discuss with patient's son at the patient's request.   Prudencio Burly III PA-C 05/29/2019 2:58 PM

## 2019-05-29 NOTE — ED Notes (Signed)
[place]. MD at bedside for aspiration of abscess of left hip.

## 2019-05-30 DIAGNOSIS — M009 Pyogenic arthritis, unspecified: Secondary | ICD-10-CM | POA: Diagnosis not present

## 2019-05-30 DIAGNOSIS — D62 Acute posthemorrhagic anemia: Secondary | ICD-10-CM

## 2019-05-30 DIAGNOSIS — T8452XA Infection and inflammatory reaction due to internal left hip prosthesis, initial encounter: Secondary | ICD-10-CM | POA: Diagnosis not present

## 2019-05-30 DIAGNOSIS — Z9889 Other specified postprocedural states: Secondary | ICD-10-CM

## 2019-05-30 LAB — MAGNESIUM: Magnesium: 1.7 mg/dL (ref 1.7–2.4)

## 2019-05-30 LAB — GLUCOSE, CAPILLARY
Glucose-Capillary: 128 mg/dL — ABNORMAL HIGH (ref 70–99)
Glucose-Capillary: 142 mg/dL — ABNORMAL HIGH (ref 70–99)
Glucose-Capillary: 210 mg/dL — ABNORMAL HIGH (ref 70–99)
Glucose-Capillary: 139 mg/dL — ABNORMAL HIGH (ref 70–99)

## 2019-05-30 LAB — CBC
HCT: 23.9 % — ABNORMAL LOW (ref 39.0–52.0)
Hemoglobin: 7.4 g/dL — ABNORMAL LOW (ref 13.0–17.0)
MCH: 27 pg (ref 26.0–34.0)
MCHC: 31 g/dL (ref 30.0–36.0)
MCV: 87.2 fL (ref 80.0–100.0)
Platelets: 292 10*3/uL (ref 150–400)
RBC: 2.74 MIL/uL — ABNORMAL LOW (ref 4.22–5.81)
RDW: 15.9 % — ABNORMAL HIGH (ref 11.5–15.5)
WBC: 4.7 10*3/uL (ref 4.0–10.5)
nRBC: 0 % (ref 0.0–0.2)

## 2019-05-30 LAB — BASIC METABOLIC PANEL
Anion gap: 8 (ref 5–15)
BUN: 24 mg/dL — ABNORMAL HIGH (ref 8–23)
CO2: 20 mmol/L — ABNORMAL LOW (ref 22–32)
Calcium: 9 mg/dL (ref 8.9–10.3)
Chloride: 107 mmol/L (ref 98–111)
Creatinine, Ser: 1.36 mg/dL — ABNORMAL HIGH (ref 0.61–1.24)
GFR calc Af Amer: 54 mL/min — ABNORMAL LOW (ref >60)
GFR calc non Af Amer: 46 mL/min — ABNORMAL LOW (ref >60)
Glucose, Bld: 140 mg/dL — ABNORMAL HIGH (ref 70–99)
Potassium: 3.9 mmol/L (ref 3.5–5.1)
Sodium: 135 mmol/L (ref 135–145)

## 2019-05-30 LAB — VITAMIN B12: Vitamin B-12: 266 pg/mL (ref 180–914)

## 2019-05-30 LAB — FERRITIN: Ferritin: 296 ng/mL (ref 24–336)

## 2019-05-30 LAB — IRON AND TIBC
Iron: 39 ug/dL — ABNORMAL LOW (ref 45–182)
Saturation Ratios: 17 % — ABNORMAL LOW (ref 17.9–39.5)
TIBC: 232 ug/dL — ABNORMAL LOW (ref 250–450)
UIBC: 193 ug/dL

## 2019-05-30 LAB — FOLATE: Folate: 12.6 ng/mL (ref >5.9)

## 2019-05-30 LAB — HEMOGLOBIN AND HEMATOCRIT, BLOOD
HCT: 24.2 % — ABNORMAL LOW (ref 39.0–52.0)
Hemoglobin: 7.4 g/dL — ABNORMAL LOW (ref 13.0–17.0)

## 2019-05-30 MED ORDER — SODIUM CHLORIDE 0.9% FLUSH
10.0000 mL | INTRAVENOUS | Status: DC | PRN
  Administered 2019-06-01: 10 mL

## 2019-05-30 MED ORDER — SODIUM CHLORIDE 0.9% FLUSH
10.0000 mL | Freq: Two times a day (BID) | INTRAVENOUS | Status: DC
  Administered 2019-05-30: 10 mL

## 2019-05-30 MED ORDER — TAMSULOSIN HCL 0.4 MG PO CAPS: 0.4000 mg | ORAL_CAPSULE | Freq: Two times a day (BID) | ORAL | Status: DC

## 2019-05-30 MED ORDER — TAMSULOSIN HCL 0.4 MG PO CAPS
0.4000 mg | ORAL_CAPSULE | Freq: Every day | ORAL | Status: DC
  Administered 2019-05-30 – 2019-06-04 (×5): 0.4 mg via ORAL
  Filled 2019-05-30 (×5): qty 1

## 2019-05-30 MED ORDER — MAGNESIUM SULFATE 4 GM/100ML IV SOLN
4.0000 g | Freq: Once | INTRAVENOUS | Status: AC
  Administered 2019-05-30: 4 g via INTRAVENOUS
  Filled 2019-05-30: qty 100

## 2019-05-30 NOTE — Progress Notes (Addendum)
Urology Consult Progress Note   Subjective: Reports that he is overall doing well.  Reports he has not had any issues overnight.  His catheter was irrigated twice for color, not for symptomatic pain.  Reports he is hungry.  No penile or bladder pain.  Objective: Vital signs in last 24 hours: Temp:  [98.2 F (36.8 C)-99.4 F (37.4 C)] 98.2 F (36.8 C) (05/01 0612) Pulse Rate:  [57-93] 91 (05/01 1050) Resp:  [15-26] 17 (05/01 0612) BP: (121-159)/(54-76) 136/59 (05/01 1050) SpO2:  [92 %-100 %] 94 % (05/01 0612) Weight:  [73.3 kg] 73.3 kg (05/01 0321)  Intake/Output from previous day: 04/30 0701 - 05/01 0700 In: 3026.1 [P.O.:300; I.V.:2083.8; IV Piggyback:642.3] Out: [id number] [Urine:3425] Intake/Output this shift: No intake/output data recorded.  Physical Exam:  Abdomen: Soft, no SP pain.  GU: Foley in place draining merlot colored urine. No large clots.   Lab Results: Recent Labs    05/28/19 1300 05/29/19 0540 05/30/19 0529  HGB 9.1* 8.0* 7.4*  HCT 29.5* 25.8* 23.9*   Recent Labs    05/29/19 0540 05/30/19 0529  NA 133* 135  K 4.2 3.9  CL 106 107  CO2 19* 20*  GLUCOSE 99 140*  BUN 26* 24*  CREATININE 1.49* 1.36*  CALCIUM 9.3 9.0    Studies/Results: CT ABDOMEN PELVIS WO CONTRAST  Result Date: 05/29/2019 CLINICAL DATA:  Hematuria EXAM: CT ABDOMEN AND PELVIS WITHOUT CONTRAST TECHNIQUE: Multidetector CT imaging of the abdomen and pelvis was performed following the standard protocol without IV contrast. COMPARISON:  None FINDINGS: Lower chest: Trace pleural effusions and bibasilar atelectasis. Hepatobiliary: No focal liver abnormality is seen. Status post cholecystectomy. No biliary dilatation. Pancreas: Unremarkable. Spleen: Unremarkable. Adrenals/Urinary Tract: Hyperdensity within the bladder likely reflecting hemorrhage. Air is present secondary to Foley catheter placement. Portions of the bladder are obscured by artifact. Left greater than right renal cortical scarring.  No hydronephrosis. Stomach/Bowel: Small hiatal hernia. Stomach is otherwise unremarkable. Bowel is normal in caliber. Vascular/Lymphatic: Aortic atherosclerosis. No enlarged abdominal or pelvic lymph nodes. Reproductive: Obscured by artifact Other: No ascites. Musculoskeletal: Partially imaged collection of the anterior left hip better evaluated on concurrent dedicated imaging. Degenerative changes of the lumbar spine. Bilateral total hip arthroplasties. IMPRESSION: Moderate hemorrhage within the bladder.  Foley catheter is present. Electronically Signed   By: Macy Mis M.D.   On: 05/29/2019 11:55   CT HIP LEFT WO CONTRAST  Result Date: 05/29/2019 CLINICAL DATA:  Hip replacement, question of infection, high fevers EXAM: CT OF THE LEFT HIP WITHOUT CONTRAST TECHNIQUE: Multidetector CT imaging of the left hip was performed according to the standard protocol. Multiplanar CT image reconstructions were also generated. COMPARISON:  May 28, 2019 radiograph, CT May 08, 2019 FINDINGS: Bones/Joint/Cartilage The patient is status post left total hip arthroplasty. No periprosthetic lucency or fracture is seen. No hip joint effusion is noted. The femoral head component is well seated within the acetabular component. Ligaments Suboptimally assessed by CT. Muscles and Tendons Head are within or adjacent to the tensor fascia lata there is a multilocular fluid collection containing foci of air measuring approximately 4.7 x 5.3 by 9.7 cm extending along the anterolateral leg. There is mild fatty atrophy of the remainder of the muscles surrounding the hip. Limited visualization of the tendons is seen. The previously seen multilocular collection along the posterior hip is no longer identified. Soft tissues There appears to be a hyperdense material layering in the posterior bladder which could represent layering blood products. The bladder appears to  be markedly distended. Surgical suture seen along the lower left anterior  inguinal wall. There is subcutaneous edema along the lateral aspect of the hip. IMPRESSION: 1. There remains a multilocular complex air and fluid collection within the anterior hip measuring approximately 4.7 x 5.3 x 9.7 cm extending along the anterior thigh, concerning for multilocular abscess. 2. Interval resolution of the posterior multilocular collection seen on prior CT of 4 9 2021. 3. Hyperdense probable blood products within the posterior bladder with a markedly distended bladder. Electronically Signed   By: Prudencio Pair M.D.   On: 05/29/2019 02:21   DG Chest Portable 1 View  Result Date: 05/28/2019 CLINICAL DATA:  Fever. EXAM: PORTABLE CHEST 1 VIEW COMPARISON:  None. FINDINGS: The heart size and mediastinal contours are within normal limits. Both lungs are clear. The visualized skeletal structures are unremarkable. IMPRESSION: No active disease. Electronically Signed   By: Marijo Conception M.D.   On: 05/28/2019 13:22   DG Hip Unilat W or Wo Pelvis 2-3 Views Left  Result Date: 05/28/2019 CLINICAL DATA:  Postoperative left hip infection. EXAM: DG HIP (WITH OR WITHOUT PELVIS) 2-3V LEFT COMPARISON:  Fluoroscopic images of May 12, 2019. FINDINGS: The left femoral and acetabular components appear to be well situated. No fracture or dislocation is noted. Gas is noted in the surrounding soft tissues concerning for postoperative change or possibly infection. IMPRESSION: Status post left total hip arthroplasty. Gas is noted in surrounding soft tissues which may represent postoperative change, but infection cannot be excluded. Electronically Signed   By: Marijo Conception M.D.   On: 05/28/2019 13:23    Assessment/Plan:  86 y.o. 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  Urology was consulted for urinary retention.    Acute urinary retention in the setting of gross hematuria He has a history of  urinary retention requiring catheter placements.  He has followed up in clinic and has passed trial void in the past.  He has lower urinary tract symptoms and a known enlarged prostate.  He is on chronic anticoagulation for thrombus.  CT scan demonstrated intravesical clot.  Foley catheter was placed without issues and has been draining well. Urine culture was without growth. Likely etiology is traumatic catheterization while on anticoagulation.  -Continue Foley catheter drainage -Nurse to flush as needed for symptoms/obstruction -Notify urology if unable to irrigate free, may need to consider placement of larger catheter with bedside hand irrigation/CBI if symptoms become more problematic -Follow-up as an outpatient for Foley catheter removal -Will require outpatient hematuria evaluation  History of bladder cancer Reported history of bladder cancer in the mid 2000s; unclear stage.  Reports that he underwent routine cystoscopies for several years which were normal.  CT scan (noncontrasted) from 05/29/2019 with no lymphadenopathy but with large intravesical clot, cannot exclude bladder mass.  -Follow-up for outpatient evaluation   LOS: 2 days

## 2019-05-30 NOTE — Progress Notes (Signed)
PROGRESS NOTE    Casey [name]  [id number] DOB: 1929-05-08 DOA: 05/28/2019 PCP: Glenda Chroman, MD   Chief Complaint  Patient presents with  . Hypoglycemia  . Fever    Brief Narrative:  HPI per Dr. Nils Pyle [name] is a 86 y.o. male with medical history significant of diabetes mellitus, hypothyroidism, hypertension, chronic kidney disease stage III, anemia. Patient has a hip surgery with left hip replacement in November 2020.  Patient was found to have multiloculated joint effusion concerning for septic arthritis and was admitted on 05/09/2019 to [facility], was seen in consultation by Dr. Percell Miller and Dr. 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  Patient states that he got his second dose of COVID-19 vaccine last Thursday, moderna vaccine. Patient has been experiencing intermittent fevers with a T-max at home of 102.9 Fahrenheit. His highest temperature here in the ER was 100.9 Fahrenheit. He has been on IV penicillin at home. He states he has been compliant with this. He denies any new hip pain.   Patient did mention that he has been having diarrhea 2-3 episodes, loose stools every day since the last for 5 days.  Also did complain of hypoglycemia with his blood glucose level dropping down to 20s, he stopped his oral medications and it has improved since then.  Patient complains of chills.  Patient denies any chest pain.  Denies any shortness of breath.  He does complain of some coughing with occasional sputum production which is clear.  Denies any abdominal pain, nausea or any vomitings.  Denies any hip pain.  Denies any discharge from the left hip site.  While the patient was in the ER he did have a bowel movement which was loose, not watery, there was some bright red blood in the bedside  commode.  Patient mentions that blood came out from his bowels and also some from his urine.  Patient did mention that he took his Xarelto this morning.   ED Course:  Haywood Pao spoke to Dr. Percell Miller regarding patient status, and also spoke with Dr Baxter Flattery ID.  Urinalysis was checked and no source of any infection was seen, chest x-ray showed no evidence of any pneumonia.  Patient was treated with broad-spectrum antibiotics with vancomycin, cefepime, Flagyl.  Assessment & Plan:   Active Problems:   Sepsis ([place])   Fever   Hematuria, gross  1 sepsis, possibly likely secondary to septic arthritis of the hip POA/recent septic arthritis of the left hip prosthesis status post irrigation and debridement 05/12/2019 per Dr. Percell Miller Patient presenting and meeting criteria for sepsis with a fever with a temp of 102, lactic acid level of 1.9, elevated CRP, tachycardia.  Patient had presented with fever and chills prior to admission.  Initial concern for left hip infection.  CT of the left hip with concern for multiloculated abscess within the anterior hip, interval resolution of posterior multilocular collection seen on prior CT, hyperdense probable blood products within the posterior bladder with markedly distended bladder.  Orthopedics consulted and orthopedics recommending IR aspiration of fluid collection which was done on 05/29/2019 with cultures pending.  Continue IV vancomycin and IV Rocephin.  ID and urology following.    2.  Gross hematuria/urinary retention Patient noted to have a gross hematuria and noted to have  some urinary retention overnight.  Foley catheter placed with 1 L of bloody urine immediately coming out per RN.  Anticoagulation on hold.  Case discussed with urology who recommended CT of the abdomen and pelvis which showed moderate hemorrhage within the bladder.  Foley catheter present.  Patient seen by urology who recommended flushing Foley as needed for symptoms/obstruction and recommending  continue Foley catheter on discharge per urology with outpatient follow-up.   3.  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  Urology consulted.  Urology to determine when okay to resume anticoagulation.  Orthopedics assessing to see whether patient may need surgery and as such we will hold off for now.  Follow.  4.  Acute kidney injury on chronic kidney disease stage IIIb, POA Avoid nephrotoxic agents.  Renal function improved with hydration.  Currently at baseline with a creatinine of 1.36.  Follow.  5.  Chronic anemia Likely secondary to chronic kidney disease.  Continue bicarb tablets.  Follow H&H.  6.  BPH Flomax.  7.  Hypothyroidism Continue Synthroid.  8.  Diabetes mellitus type 2 Hemoglobin A1c 7.6 (05/10/2019).  CBG of 128 this morning.  Patient made n.p.o. earlier this morning pending orthopedic determination of whether patient is going to have a procedure today or not.  Continue to hold oral hypoglycemic agents.  Sliding scale insulin.   9.  Nonobstructive coronary artery disease Stable.  Continue ACE inhibitor, beta-blocker, Lovaza.   10.  Diarrhea C. difficile PCR negative.  Continue probiotics.  Imodium as needed.  11.  Acute blood loss anemia Secondary to gross hematuria.  Follow H&H.  Transfusion threshold hemoglobin < 7.   DVT prophylaxis: SCDs/patient with gross hematuria. Code Status: Full Family Communication: Updated patient and son at bedside.. Disposition:   Status is: Inpatient    Dispo: The patient is from: Home              Anticipated d/c is to: To be determined however likely home with home health.              Anticipated d/c date is: To be determined              Patient currently admitted with probable septic hip, currently on IV antibiotics, also with gross hematuria, was on chronic anticoagulation for LV thrombus however currently  on hold.  Consultations with orthopedics, ID and urology ff        Consultants:   Orthopedics: Dr. Percell Miller 05/29/2019  ID: Dr. Baxter Flattery 05/29/2019  Urology: Dr. Louis Meckel 05/29/2019  Procedures:   CT abdomen and pelvis 05/29/2019  CT left hip 05/29/2019  Chest x-ray 05/28/2019  Plain films of the left hip 05/28/2019  Antimicrobials:   IV vancomycin 05/28/2019  IV cefepime 05/28/2019>>>> 05/29/2019  IV Flagyl 05/28/2019>>>> 05/29/2019  IV Rocephin 05/29/2019   Subjective: Laying in bed.  Denies any chest pain or shortness of breath.  Foley catheter with hematuria noted.  Patient asking whether he is going to have surgery today.   Objective: Vitals:   05/29/19 2128 05/30/19 0155 05/30/19 0321 05/30/19 0612  BP: (!) 122/57 (!) 132/58  (!) 134/54  Pulse: (!) 57 83  88  Resp: 17 18  17   Temp: 98.9 F (37.2 C) 98.2 F (36.8 C)  98.2 F (36.8 C)  TempSrc: Oral Oral  Oral  SpO2: 95% 97%  94%  Weight:   73.3 kg   Height:   5\' 6"  (  1.676 m)     Intake/Output Summary (Last 24 hours) at 05/30/2019 1025 Last data filed at 05/30/2019 0614 Gross per 24 hour  Intake 2168.03 ml  Output 2425 ml  Net -256.97 ml   Filed Weights   05/29/19 0144 05/30/19 0321  Weight: 73.5 kg 73.3 kg    Examination:  General exam: NAD Respiratory system: Clear to auscultation bilaterally anterior lung fields.  No wheezing, no crackles, no rhonchi.  Normal respiratory effort. Cardiovascular system: Regular rate rhythm no murmurs rubs or gallops.  No JVD.  No lower extremity edema.  Gastrointestinal system: Abdomen is soft, nontender, nondistended, positive bowel sounds.  No rebound.  No guarding.   Central nervous system: Alert and oriented. No focal neurological deficits. Extremities: Symmetric 5 x 5 power. Skin: No rashes, lesions or ulcers Psychiatry: Judgement and insight appear normal. Mood & affect appropriate.     Data Reviewed: I have personally reviewed following labs and imaging  studies  CBC: Recent Labs  Lab 05/28/19 1300 05/29/19 0540 05/30/19 0529  WBC 5.4 6.2 4.7  NEUTROABS 3.1  --   --   HGB 9.1* 8.0* 7.4*  HCT 29.5* 25.8* 23.9*  MCV 86.8 88.4 87.2  PLT 311 279 [id number]    Basic Metabolic Panel: Recent Labs  Lab 05/28/19 1300 05/29/19 0540 05/30/19 0529  NA 132* 133* 135  K 4.8 4.2 3.9  CL 101 106 107  CO2 21* 19* 20*  GLUCOSE 111* 99 140*  BUN 30* 26* 24*  CREATININE 1.62* 1.49* 1.36*  CALCIUM 10.0 9.3 9.0  MG  --   --  1.7    GFR: Estimated Creatinine Clearance: 34.5 mL/min (A) (by C-G formula based on SCr of 1.36 mg/dL (H)).  Liver Function Tests: Recent Labs  Lab 05/28/19 1300  AST 22  ALT 12  ALKPHOS 33*  BILITOT 0.5  PROT 6.3*  ALBUMIN 2.5*    CBG: Recent Labs  Lab 05/29/19 0808 05/29/19 1226 05/29/19 1630 05/29/19 2126 05/30/19 0729  GLUCAP 91 95 152* 148* 128*     Recent Results (from the past 240 hour(s))  Respiratory Panel by RT PCR (Flu A&B, Covid) - Nasopharyngeal Swab     Status: None   Collection Time: 05/28/19  1:00 PM   Specimen: Nasopharyngeal Swab  Result Value Ref Range Status   SARS Coronavirus 2 by RT PCR NEGATIVE NEGATIVE Final    Comment: (NOTE) SARS-CoV-2 target nucleic acids are NOT DETECTED. The SARS-CoV-2 RNA is generally detectable in upper respiratoy specimens during the acute phase of infection. The lowest concentration of SARS-CoV-2 viral copies this assay can detect is 131 copies/mL. A negative result does not preclude SARS-Cov-2 infection and should not be used as the sole basis for treatment or other patient management decisions. A negative result may occur with  improper specimen collection/handling, submission of specimen other than nasopharyngeal swab, presence of viral mutation(s) within the areas targeted by this assay, and inadequate number of viral copies (<131 copies/mL). A negative result must be combined with clinical observations, patient history, and epidemiological  information. The expected result is Negative. Fact Sheet for Patients:  PinkCheek.be Fact Sheet for Healthcare Providers:  GravelBags.it This test is not yet ap proved or cleared by the [place] FDA and  has been authorized for detection and/or diagnosis of SARS-CoV-2 by FDA under an Emergency Use Authorization (EUA). This EUA will remain  in effect (meaning this test can be used) for the duration of the COVID-19 declaration under Section 564(b)(1) of  the Act, 21 U.S.C. section 360bbb-3(b)(1), unless the authorization is terminated or revoked sooner.    Influenza A by PCR NEGATIVE NEGATIVE Final   Influenza B by PCR NEGATIVE NEGATIVE Final    Comment: (NOTE) The Xpert Xpress SARS-CoV-2/FLU/RSV assay is intended as an aid in  the diagnosis of influenza from Nasopharyngeal swab specimens and  should not be used as a sole basis for treatment. Nasal washings and  aspirates are unacceptable for Xpert Xpress SARS-CoV-2/FLU/RSV  testing. Fact Sheet for Patients: PinkCheek.be Fact Sheet for Healthcare Providers: GravelBags.it This test is not yet approved or cleared by the [place] FDA and  has been authorized for detection and/or diagnosis of SARS-CoV-2 by  FDA under an Emergency Use Authorization (EUA). This EUA will remain  in effect (meaning this test can be used) for the duration of the  Covid-19 declaration under Section 564(b)(1) of the Act, 21  U.S.C. section 360bbb-3(b)(1), unless the authorization is  terminated or revoked. Performed at [facility], [place] [address] [place], [place] [address]   Blood culture (routine x 2)     Status: None (Preliminary result)   Collection Time: 05/28/19  1:00 PM   Specimen: BLOOD  Result Value Ref Range Status   Specimen Description   Final    BLOOD RIGHT ANTECUBITAL Performed at [place] [address] [place], [place] [address]    Special Requests   Final    BOTTLES DRAWN AEROBIC AND ANAEROBIC Blood Culture results may not be optimal due to an excessive volume of blood received in culture bottles Performed at [place] [address] [place], [place] [address]    Culture   Final    NO GROWTH 2 DAYS Performed at [place] [address] [place], [place] [address]    Report Status PENDING  Incomplete  Blood culture (routine x 2)     Status: None (Preliminary result)   Collection Time: 05/28/19  1:20 PM   Specimen: BLOOD  Result Value Ref Range Status   Specimen Description   Final    BLOOD LEFT ANTECUBITAL Performed at [place] [address] [place], [place] [address]    Special Requests   Final    BOTTLES DRAWN AEROBIC AND ANAEROBIC Blood Culture adequate volume Performed at [place] [address] [place], [place] [address]    Culture   Final    NO GROWTH 2 DAYS Performed at [place] [address], [place], [place] [address]    Report Status PENDING  Incomplete  Urine culture     Status: None   Collection Time: 05/28/19  4:27 PM   Specimen: In/Out Cath Urine  Result Value Ref Range Status   Specimen Description   Final    IN/OUT CATH URINE Performed at [place] [address] [place], [place] [address]    Special Requests   Final    NONE Performed at [facility], [place] [address], [place], [place] [address]    Culture   Final    NO GROWTH Performed at [facility], [place] [address], [place], [place] [address]    Report Status 05/29/2019 FINAL  Final  C Difficile Quick Screen w PCR reflex     Status: None   Collection Time: 05/29/19 12:34 AM   Specimen: STOOL  Result Value Ref Range Status   C Diff antigen NEGATIVE NEGATIVE  Final   C Diff toxin NEGATIVE NEGATIVE Final   C Diff  interpretation No C. difficile detected.  Final    Comment: Performed at [facility], [place] [address] [place], [place] [address]  Body fluid culture     Status: None (Preliminary result)   Collection Time: 05/29/19  4:29 PM   Specimen: Synovium; Body Fluid  Result Value Ref Range Status   Specimen Description   Final    SYNOVIAL LEFT HIP Performed at [place] [address], [place], [place] [address]    Special Requests   Final    NONE Performed at [facility], [place] [address] [place], [place] [address]    Gram Stain   Final    ABUNDANT WBC PRESENT,BOTH PMN AND MONONUCLEAR NO ORGANISMS SEEN Gram Stain Report Called to,Read Back By and Verified With: C.FRANKLIN AT 1736 ON 05/29/19 BY N.Laurna Shetley Performed at [facility], [place] [address] [place], [place] [address]    Culture   Final    NO GROWTH < 24 HOURS Performed at [place] [address], [place], [place] [address]    Report Status PENDING  Incomplete  Anaerobic culture     Status: None (Preliminary result)   Collection Time: 05/29/19  4:29 PM   Specimen: Synovium; Synovial Fluid  Result Value Ref Range Status   Specimen Description   Final    SYNOVIAL LEFT HIP Performed at [place] [address], [place], [place] [address]    Special Requests   Final    NONE Performed at [facility], [place] [address] [place], [place] [address]    Gram Stain   Final    ABUNDANT WBC PRESENT,BOTH PMN AND MONONUCLEAR NO ORGANISMS SEEN Performed at [facility], [place] [address] [place], [place] [address]    Culture PENDING  Incomplete   Report Status PENDING  Incomplete         Radiology Studies: CT ABDOMEN PELVIS WO CONTRAST  Result Date: 05/29/2019 CLINICAL DATA:  Hematuria EXAM: CT ABDOMEN AND PELVIS WITHOUT CONTRAST TECHNIQUE: Multidetector CT imaging of the abdomen and pelvis was  performed following the standard protocol without IV contrast. COMPARISON:  None FINDINGS: Lower chest: Trace pleural effusions and bibasilar atelectasis. Hepatobiliary: No focal liver abnormality is seen. Status post cholecystectomy. No biliary dilatation. Pancreas: Unremarkable. Spleen: Unremarkable. Adrenals/Urinary Tract: Hyperdensity within the bladder likely reflecting hemorrhage. Air is present secondary to Foley catheter placement. Portions of the bladder are obscured by artifact. Left greater than right renal cortical scarring. No hydronephrosis. Stomach/Bowel: Small hiatal hernia. Stomach is otherwise unremarkable. Bowel is normal in caliber. Vascular/Lymphatic: Aortic atherosclerosis. No enlarged abdominal or pelvic lymph nodes. Reproductive: Obscured by artifact Other: No ascites. Musculoskeletal: Partially imaged collection of the anterior left hip better evaluated on concurrent dedicated imaging. Degenerative changes of the lumbar spine. Bilateral total hip arthroplasties. IMPRESSION: Moderate hemorrhage within the bladder.  Foley catheter is present. Electronically Signed   By: Macy Mis M.D.   On: 05/29/2019 11:55   CT HIP LEFT WO CONTRAST  Result Date: 05/29/2019 CLINICAL DATA:  Hip replacement, question of infection, high fevers EXAM: CT OF THE LEFT HIP WITHOUT CONTRAST TECHNIQUE: Multidetector CT imaging of the left hip was performed according to the standard protocol. Multiplanar CT image reconstructions were also generated. COMPARISON:  May 28, 2019 radiograph, CT May 08, 2019 FINDINGS: Bones/Joint/Cartilage The patient is status post left total hip arthroplasty.  No periprosthetic lucency or fracture is seen. No hip joint effusion is noted. The femoral head component is well seated within the acetabular component. Ligaments Suboptimally assessed by CT. Muscles and Tendons Head are within or adjacent to the tensor fascia lata there is a multilocular fluid collection containing foci of  air measuring approximately 4.7 x 5.3 by 9.7 cm extending along the anterolateral leg. There is mild fatty atrophy of the remainder of the muscles surrounding the hip. Limited visualization of the tendons is seen. The previously seen multilocular collection along the posterior hip is no longer identified. Soft tissues There appears to be a hyperdense material layering in the posterior bladder which could represent layering blood products. The bladder appears to be markedly distended. Surgical suture seen along the lower left anterior inguinal wall. There is subcutaneous edema along the lateral aspect of the hip. IMPRESSION: 1. There remains a multilocular complex air and fluid collection within the anterior hip measuring approximately 4.7 x 5.3 x 9.7 cm extending along the anterior thigh, concerning for multilocular abscess. 2. Interval resolution of the posterior multilocular collection seen on prior CT of 4 9 2021. 3. Hyperdense probable blood products within the posterior bladder with a markedly distended bladder. Electronically Signed   By: Prudencio Pair M.D.   On: 05/29/2019 02:21   DG Chest Portable 1 View  Result Date: 05/28/2019 CLINICAL DATA:  Fever. EXAM: PORTABLE CHEST 1 VIEW COMPARISON:  None. FINDINGS: The heart size and mediastinal contours are within normal limits. Both lungs are clear. The visualized skeletal structures are unremarkable. IMPRESSION: No active disease. Electronically Signed   By: Marijo Conception M.D.   On: 05/28/2019 13:22   DG Hip Unilat W or Wo Pelvis 2-3 Views Left  Result Date: 05/28/2019 CLINICAL DATA:  Postoperative left hip infection. EXAM: DG HIP (WITH OR WITHOUT PELVIS) 2-3V LEFT COMPARISON:  Fluoroscopic images of May 12, 2019. FINDINGS: The left femoral and acetabular components appear to be well situated. No fracture or dislocation is noted. Gas is noted in the surrounding soft tissues concerning for postoperative change or possibly infection. IMPRESSION: Status  post left total hip arthroplasty. Gas is noted in surrounding soft tissues which may represent postoperative change, but infection cannot be excluded. Electronically Signed   By: Marijo Conception M.D.   On: 05/28/2019 13:23        Scheduled Meds: . Chlorhexidine Gluconate Cloth  6 each Topical Daily  . cholecalciferol  5,000 Units Oral Daily  . feeding supplement (ENSURE ENLIVE)  237 mL Oral BID BM  . fenofibrate  160 mg Oral Daily  . finasteride  5 mg Oral Daily  . heparin  5,000 Units Subcutaneous Q8H  . insulin aspart  0-9 Units Subcutaneous TID WC  . levothyroxine  25 mcg Oral QAC breakfast  . lisinopril  2.5 mg Oral Daily  . metoprolol succinate  25 mg Oral Daily  . multivitamin with minerals  1 tablet Oral Daily  . multivitamins with iron  1 tablet Oral Daily  . omega-3 acid ethyl esters  1 capsule Oral Daily  . sodium bicarbonate  650 mg Oral TID  . sodium chloride flush  10-40 mL Intracatheter Q12H  . tamsulosin  0.4 mg Oral QPC supper   Continuous Infusions: . cefTRIAXone (ROCEPHIN)  IV Stopped (05/29/19 2248)  . dextrose 5 % and 0.9% NaCl 75 mL/hr at 05/30/19 0300  . magnesium sulfate bolus IVPB    . vancomycin Stopped (05/29/19 1222)  LOS: 2 days    Time spent: 40 minutes    Irine Seal, MD [facility]   To contact the attending provider between 7A-7P or the covering provider during after hours 7P-7A, please log into the web site [email or web address] and access using universal [place] password for that web site. If you do not have the password, please call the hospital operator.  05/30/2019, 10:25 AM

## 2019-05-30 NOTE — Progress Notes (Signed)
Subjective:  Patient is alert, oriented, sitting comfortably in bed. Reports pain is a 1/10. Denies chest pain, SOB, Calf pain. No nausea/vomiting. Did have 1 incident of diarrhea about an hour ago. Otherwise no complaints.    Objective:  PE: VITALS:   Vitals:   05/30/19 0155 05/30/19 0321 05/30/19 0612 05/30/19 1050  BP: (!) 132/58  (!) 134/54 (!) 136/59  Pulse: 83  88 91  Resp: 18  17   Temp: 98.2 F (36.8 C)  98.2 F (36.8 C)   TempSrc: Oral  Oral   SpO2: 97%  94%   Weight:  73.3 kg    Height:  5\' 6"  (1.676 m)     General: alert, in no acute distress. Sitting comfortably in bed. GI: abdomen soft, non-tender MSK: No pain with AROM or PROM of left hip, knee, or ankle. TTP to lateral left hip posterior to greater trochanter, no TTP near incision. No erythema or drainage noted today. EHL and FHL intact. Distal sensation intact. 2+DP pulse.   LABS  Results for orders placed or performed during the hospital encounter of 05/28/19 (from the past 24 hour(s))  Synovial cell count + diff, w/ crystals     Status: Abnormal   Collection Time: 05/29/19  4:28 PM  Result Value Ref Range   Color, Synovial RED (A) YELLOW   Appearance-Synovial TURBID (A) CLEAR   Crystals, Fluid NO CRYSTALS SEEN    WBC, Synovial 20,500 (H) 0 - 200 /cu mm   Neutrophil, Synovial 91 (H) 0 - 25 %   Lymphocytes-Synovial Fld 1 0 - 20 %   Monocyte-Macrophage-Synovial Fluid 8 (L) 50 - 90 %   Eosinophils-Synovial 0 0 - 1 %  Body fluid culture     Status: None (Preliminary result)   Collection Time: 05/29/19  4:29 PM   Specimen: Synovium; Body Fluid  Result Value Ref Range   Specimen Description      SYNOVIAL LEFT HIP Performed at [facility] [address], [place], [place] [address]    Special Requests      NONE Performed at [facility], [place] [address], [place], [place] [address]    Gram Stain      ABUNDANT WBC PRESENT,BOTH PMN AND MONONUCLEAR NO ORGANISMS  SEEN Gram Stain Report Called to,Read Back By and Verified With: C.FRANKLIN AT 1736 ON 05/29/19 BY N.THOMPSON Performed at [facility], [place] [address] [place], [place] [address]    Culture      NO GROWTH < 24 HOURS Performed at [facility] [address], [place], [place] [address]    Report Status PENDING   Anaerobic culture     Status: None (Preliminary result)   Collection Time: 05/29/19  4:29 PM   Specimen: Synovium; Synovial Fluid  Result Value Ref Range   Specimen Description      SYNOVIAL LEFT HIP Performed at [facility] [address], [place], [place] [address]    Special Requests      NONE Performed at [facility], [place] [address], [place], [place] [address]    Gram Stain      ABUNDANT WBC PRESENT,BOTH PMN AND MONONUCLEAR NO ORGANISMS SEEN Performed at [facility], [place] [address] [place], [place] [address]    Culture PENDING    Report Status PENDING   CBG monitoring, ED     Status: Abnormal   Collection Time: 05/29/19  4:30 PM  Result Value Ref Range   Glucose-Capillary 152 (H) 70 - 99 mg/dL  Glucose, capillary     Status: Abnormal   Collection Time: 05/29/19  9:26 PM  Result Value Ref Range   Glucose-Capillary 148 (H) 70 - 99 mg/dL  Vitamin B12     Status: None   Collection Time: 05/30/19  5:24 AM  Result Value Ref Range   Vitamin B-12 266 180 - 914 pg/mL  Folate     Status: None   Collection Time: 05/30/19  5:24 AM  Result Value Ref Range   Folate 12.6 >5.9 ng/mL  Iron and TIBC     Status: Abnormal   Collection Time: 05/30/19  5:24 AM  Result Value Ref Range   Iron 39 (L) 45 - 182 ug/dL   TIBC 232 (L) 250 - 450 ug/dL   Saturation Ratios 17 (L) 17.9 - 39.5 %   UIBC 193 ug/dL  Ferritin     Status: None   Collection Time: 05/30/19  5:24 AM  Result Value Ref Range   Ferritin 296 24 - 336 ng/mL  Basic metabolic panel     Status: Abnormal   Collection Time: 05/30/19  5:29 AM    Result Value Ref Range   Sodium 135 135 - 145 mmol/L   Potassium 3.9 3.5 - 5.1 mmol/L   Chloride 107 98 - 111 mmol/L   CO2 20 (L) 22 - 32 mmol/L   Glucose, Bld 140 (H) 70 - 99 mg/dL   BUN 24 (H) 8 - 23 mg/dL   Creatinine, Ser 1.36 (H) 0.61 - 1.24 mg/dL   Calcium 9.0 8.9 - 10.3 mg/dL   GFR calc non Af Amer 46 (L) >60 mL/min   GFR calc Af Amer 54 (L) >60 mL/min   Anion gap 8 5 - 15  CBC     Status: Abnormal   Collection Time: 05/30/19  5:29 AM  Result Value Ref Range   WBC 4.7 4.0 - 10.5 K/uL   RBC 2.74 (L) 4.22 - 5.81 MIL/uL   Hemoglobin 7.4 (L) 13.0 - 17.0 g/dL   HCT 23.9 (L) 39.0 - 52.0 %   MCV 87.2 80.0 - 100.0 fL   MCH 27.0 26.0 - 34.0 pg   MCHC 31.0 30.0 - 36.0 g/dL   RDW 15.9 (H) 11.5 - 15.5 %   Platelets 292 150 - 400 K/uL   nRBC 0.0 0.0 - 0.2 %  Magnesium     Status: None   Collection Time: 05/30/19  5:29 AM  Result Value Ref Range   Magnesium 1.7 1.7 - 2.4 mg/dL  Glucose, capillary     Status: Abnormal   Collection Time: 05/30/19  7:29 AM  Result Value Ref Range   Glucose-Capillary 128 (H) 70 - 99 mg/dL  Glucose, capillary     Status: Abnormal   Collection Time: 05/30/19 11:16 AM  Result Value Ref Range   Glucose-Capillary 142 (H) 70 - 99 mg/dL    CT ABDOMEN PELVIS WO CONTRAST  Result Date: 05/29/2019 CLINICAL DATA:  Hematuria EXAM: CT ABDOMEN AND PELVIS WITHOUT CONTRAST TECHNIQUE: Multidetector CT imaging of the abdomen and pelvis was performed following the standard protocol without IV contrast. COMPARISON:  None FINDINGS: Lower chest: Trace pleural effusions and bibasilar atelectasis. Hepatobiliary: No focal liver abnormality is seen. Status post cholecystectomy. No biliary dilatation. Pancreas: Unremarkable. Spleen: Unremarkable. Adrenals/Urinary Tract: Hyperdensity within the bladder likely reflecting hemorrhage. Air is present secondary to Foley catheter placement. Portions of the bladder are obscured by  artifact. Left greater than right renal cortical  scarring. No hydronephrosis. Stomach/Bowel: Small hiatal hernia. Stomach is otherwise unremarkable. Bowel is normal in caliber. Vascular/Lymphatic: Aortic atherosclerosis. No enlarged abdominal or pelvic lymph nodes. Reproductive: Obscured by artifact Other: No ascites. Musculoskeletal: Partially imaged collection of the anterior left hip better evaluated on concurrent dedicated imaging. Degenerative changes of the lumbar spine. Bilateral total hip arthroplasties. IMPRESSION: Moderate hemorrhage within the bladder.  Foley catheter is present. Electronically Signed   By: Macy Mis M.D.   On: 05/29/2019 11:55   CT HIP LEFT WO CONTRAST  Result Date: 05/29/2019 CLINICAL DATA:  Hip replacement, question of infection, high fevers EXAM: CT OF THE LEFT HIP WITHOUT CONTRAST TECHNIQUE: Multidetector CT imaging of the left hip was performed according to the standard protocol. Multiplanar CT image reconstructions were also generated. COMPARISON:  May 28, 2019 radiograph, CT May 08, 2019 FINDINGS: Bones/Joint/Cartilage The patient is status post left total hip arthroplasty. No periprosthetic lucency or fracture is seen. No hip joint effusion is noted. The femoral head component is well seated within the acetabular component. Ligaments Suboptimally assessed by CT. Muscles and Tendons Head are within or adjacent to the tensor fascia lata there is a multilocular fluid collection containing foci of air measuring approximately 4.7 x 5.3 by 9.7 cm extending along the anterolateral leg. There is mild fatty atrophy of the remainder of the muscles surrounding the hip. Limited visualization of the tendons is seen. The previously seen multilocular collection along the posterior hip is no longer identified. Soft tissues There appears to be a hyperdense material layering in the posterior bladder which could represent layering blood products. The bladder appears to be markedly distended. Surgical suture seen along the lower left  anterior inguinal wall. There is subcutaneous edema along the lateral aspect of the hip. IMPRESSION: 1. There remains a multilocular complex air and fluid collection within the anterior hip measuring approximately 4.7 x 5.3 x 9.7 cm extending along the anterior thigh, concerning for multilocular abscess. 2. Interval resolution of the posterior multilocular collection seen on prior CT of 4 9 2021. 3. Hyperdense probable blood products within the posterior bladder with a markedly distended bladder. Electronically Signed   By: Prudencio Pair M.D.   On: 05/29/2019 02:21    Assessment/Plan: Active Problems:   Sepsis ([place])   Fever   Hematuria, gross  Fluid collection left hip, s/p left total hip arthoplasty with group b strep - patient underwent I&D of left hip prosthesis with Dr. Percell Miller on 4/13 and was discharged on 6 weeks antibiotics - sepsis on arrival at ED, urine culture negative, C. Diff negative, blood cultures pending but so far no growth - IR aspiration of left hip performed yesterday, 10 cc's of fluid were sent for culture, so far no growth, will continue to watch closely - urgent I&D not indicated at this time, patient returned to normal diet and Xarelto can be restarted  Comorbid care per medicine, antibiotics per ID  Weightbearing: WBAT LLE Insicional and dressing care: open to air Showering: okay VTE prophylaxis: No surgery indicated at this time, ok to restart Xarelto.  Pain control: tylenol  Contact information:   Weekdays 8-5 Merlene Pulling, PA-C [phone] A fter hours and holidays please check [email or web address] for group call information for [facility] 05/30/2019, 2:20 PM

## 2019-05-30 NOTE — Progress Notes (Signed)
Patient with recurrent left hip fluid, along with clinical systemic septic picture, status post surgical irrigation and debridement.  May need further surgical intervention, Dr. Paralee Cancel is planning on seeing the patient tomorrow to review the case, and determine the next most appropriate plan.  The fluid pulled from the hip joint did not apparently appear grossly purulent, although it was described as "cloudy".  The cultures may be inhibited by current antibiotics.  I would hold off on anticoagulation until Dr. Aurea Graff evaluation.  Marchia Bond, MD

## 2019-05-30 NOTE — Progress Notes (Signed)
Subjective: No new complaints   Antibiotics:  Anti-infectives (From admission, onward)   Start     Dose/Rate Route Frequency Ordered Stop   05/29/19 2200  cefTRIAXone (ROCEPHIN) 2 g in sodium chloride 0.9 % 100 mL IVPB     2 g 200 mL/hr over 30 Minutes Intravenous Every 24 hours 05/29/19 1749     05/29/19 1400  ceFEPIme (MAXIPIME) 2 g in sodium chloride 0.9 % 100 mL IVPB  Status:  Discontinued     2 g 200 mL/hr over 30 Minutes Intravenous Every 24 hours 05/29/19 0152 05/29/19 1748   05/29/19 1200  vancomycin (VANCOREADY) IVPB 750 mg/150 mL     750 mg 150 mL/hr over 60 Minutes Intravenous Every 24 hours 05/29/19 0152     05/29/19 0030  metroNIDAZOLE (FLAGYL) IVPB 500 mg  Status:  Discontinued     500 mg 100 mL/hr over 60 Minutes Intravenous Every 8 hours 05/29/19 0024 05/29/19 1748   05/28/19 1445  ceFEPIme (MAXIPIME) 2 g in sodium chloride 0.9 % 100 mL IVPB     2 g 200 mL/hr over 30 Minutes Intravenous  Once 05/28/19 1444 05/28/19 1635   05/28/19 1445  metroNIDAZOLE (FLAGYL) IVPB 500 mg     500 mg 100 mL/hr over 60 Minutes Intravenous  Once 05/28/19 1444 05/28/19 1635   05/28/19 1445  vancomycin (VANCOCIN) IVPB 1000 mg/200 mL premix     1,000 mg 200 mL/hr over 60 Minutes Intravenous  Once 05/28/19 1444 05/28/19 1635      Medications: Scheduled Meds: . Chlorhexidine Gluconate Cloth  6 each Topical Daily  . cholecalciferol  5,000 Units Oral Daily  . feeding supplement (ENSURE ENLIVE)  237 mL Oral BID BM  . fenofibrate  160 mg Oral Daily  . finasteride  5 mg Oral Daily  . heparin  5,000 Units Subcutaneous Q8H  . insulin aspart  0-9 Units Subcutaneous TID WC  . levothyroxine  25 mcg Oral QAC breakfast  . lisinopril  2.5 mg Oral Daily  . metoprolol succinate  25 mg Oral Daily  . multivitamin with minerals  1 tablet Oral Daily  . multivitamins with iron  1 tablet Oral Daily  . omega-3 acid ethyl esters  1 capsule Oral Daily  . sodium bicarbonate  650 mg Oral TID    . sodium chloride flush  10-40 mL Intracatheter Q12H  . tamsulosin  0.4 mg Oral QPC supper   Continuous Infusions: . cefTRIAXone (ROCEPHIN)  IV Stopped (05/29/19 2248)  . dextrose 5 % and 0.9% NaCl 75 mL/hr at 05/30/19 1332  . vancomycin 750 mg (05/30/19 1349)   PRN Meds:.acetaminophen **OR** acetaminophen, loperamide, methocarbamol, ondansetron **OR** ondansetron (ZOFRAN) IV, senna-docusate, sodium chloride flush    Objective: Weight change: -0.227 kg  Intake/Output Summary (Last 24 hours) at 05/30/2019 1635 Last data filed at 05/30/2019 1558 Gross per 24 hour  Intake 1784.26 ml  Output 1475 ml  Net 309.26 ml   Blood pressure (!) 124/56, pulse 83, temperature 98.6 F (37 C), temperature source Oral, resp. rate 19, height 5\' 6"  (1.676 m), weight 73.3 kg, SpO2 98 %. Temp:  [98.2 F (36.8 C)-99.4 F (37.4 C)] 98.6 F (37 C) (05/01 1424) Pulse Rate:  [57-93] 83 (05/01 1424) Resp:  [17-26] 19 (05/01 1424) BP: (121-159)/(54-65) 124/56 (05/01 1424) SpO2:  [93 %-98 %] 98 % (05/01 1424) Weight:  [73.3 kg] 73.3 kg (05/01 0321)  Physical Exam: General: Alert and awake, oriented x3, not in any  acute distress. HEENT: anicteric sclera, EOMI CVS regular rate, normal  Chest: , no wheezing, no respiratory distress Abdomen: soft non-distended,   Neuro: nonfocal  CBC:    BMET Recent Labs    05/29/19 0540 05/30/19 0529  NA 133* 135  K 4.2 3.9  CL 106 107  CO2 19* 20*  GLUCOSE 99 140*  BUN 26* 24*  CREATININE 1.49* 1.36*  CALCIUM 9.3 9.0     Liver Panel  Recent Labs    05/28/19 1300  PROT 6.3*  ALBUMIN 2.5*  AST 22  ALT 12  ALKPHOS 33*  BILITOT 0.5       Sedimentation Rate Recent Labs    05/28/19 1300  ESRSEDRATE 75*   C-Reactive Protein Recent Labs    05/28/19 1300  CRP 13.2*    Micro Results: Recent Results (from the past 720 hour(s))  Surgical pcr screen     Status: None   Collection Time: 05/09/19  3:21 AM   Specimen: Nasal Mucosa; Nasal  Swab  Result Value Ref Range Status   MRSA, PCR NEGATIVE NEGATIVE Final   Staphylococcus aureus NEGATIVE NEGATIVE Final    Comment: (NOTE) The Xpert SA Assay (FDA approved for NASAL specimens in patients 36 years of age and older), is one component of a comprehensive surveillance program. It is not intended to diagnose infection nor to guide or monitor treatment. Performed at [facility], [place] [address], [place], [place] [address]   Culture, blood (routine x 2)     Status: None   Collection Time: 05/09/19  4:33 AM   Specimen: BLOOD  Result Value Ref Range Status   Specimen Description BLOOD LEFT FOREARM  Final   Special Requests   Final    BOTTLES DRAWN AEROBIC AND ANAEROBIC Blood Culture adequate volume   Culture   Final    NO GROWTH 5 DAYS Performed at [facility], [place] [address], [place], [place] [address]    Report Status 05/14/2019 FINAL  Final  Culture, blood (routine x 2)     Status: None   Collection Time: 05/09/19  4:43 AM   Specimen: BLOOD  Result Value Ref Range Status   Specimen Description BLOOD LEFT HAND  Final   Special Requests   Final    BOTTLES DRAWN AEROBIC ONLY Blood Culture adequate volume   Culture   Final    NO GROWTH 5 DAYS Performed at [facility], [place] [address] [place], [place] [address]    Report Status 05/14/2019 FINAL  Final  Aerobic/Anaerobic Culture (surgical/deep wound)     Status: None   Collection Time: 05/09/19  5:06 PM   Specimen: Wound; Abscess  Result Value Ref Range Status   Specimen Description WOUND LEFT THIGH US ASPIRATION  Final   Special Requests NONE  Final   Gram Stain   Final    ABUNDANT WBC PRESENT, PREDOMINANTLY PMN NO ORGANISMS SEEN    Culture   Final    RARE GROUP B STREP(S.AGALACTIAE)ISOLATED TESTING AGAINST S. AGALACTIAE NOT ROUTINELY PERFORMED DUE TO PREDICTABILITY OF AMP/PEN/VAN SUSCEPTIBILITY. NO ANAEROBES ISOLATED Performed at [facility], [place] [address], [place], [place]  [address]    Report Status 05/15/2019 FINAL  Final  SARS CORONAVIRUS 2 (TAT 6-24 HRS) Nasopharyngeal Nasopharyngeal Swab     Status: None   Collection Time: 05/11/19  2:48 PM   Specimen: Nasopharyngeal Swab  Result Value Ref Range Status   SARS Coronavirus 2 NEGATIVE NEGATIVE Final  Comment: (NOTE) SARS-CoV-2 target nucleic acids are NOT DETECTED. The SARS-CoV-2 RNA is generally detectable in upper and lower respiratory specimens during the acute phase of infection. Negative results do not preclude SARS-CoV-2 infection, do not rule out co-infections with other pathogens, and should not be used as the sole basis for treatment or other patient management decisions. Negative results must be combined with clinical observations, patient history, and epidemiological information. The expected result is Negative. Fact Sheet for Patients: SugarRoll.be Fact Sheet for Healthcare Providers: https://www.woods-mathews.com/ This test is not yet approved or cleared by the [place] FDA and  has been authorized for detection and/or diagnosis of SARS-CoV-2 by FDA under an Emergency Use Authorization (EUA). This EUA will remain  in effect (meaning this test can be used) for the duration of the COVID-19 declaration under Section 56 4(b)(1) of the Act, 21 U.S.C. section 360bbb-3(b)(1), unless the authorization is terminated or revoked sooner. Performed at [facility], [place] [address] [place], [place] [address]   Surgical pcr screen     Status: None   Collection Time: 05/12/19  8:14 AM   Specimen: Nasal Mucosa; Nasal Swab  Result Value Ref Range Status   MRSA, PCR NEGATIVE NEGATIVE Final   Staphylococcus aureus NEGATIVE NEGATIVE Final    Comment: (NOTE) The Xpert SA Assay (FDA approved for NASAL specimens in patients 52 years of age and older), is one component of a comprehensive surveillance program. It is not intended to diagnose infection nor  to guide or monitor treatment. Performed at [facility], [place] [address], [place],  [address]   Respiratory Panel by RT PCR (Flu A&B, Covid) - Nasopharyngeal Swab     Status: None   Collection Time: 05/28/19  1:00 PM   Specimen: Nasopharyngeal Swab  Result Value Ref Range Status   SARS Coronavirus 2 by RT PCR NEGATIVE NEGATIVE Final    Comment: (NOTE) SARS-CoV-2 target nucleic acids are NOT DETECTED. The SARS-CoV-2 RNA is generally detectable in upper respiratoy specimens during the acute phase of infection. The lowest concentration of SARS-CoV-2 viral copies this assay can detect is 131 copies/mL. A negative result does not preclude SARS-Cov-2 infection and should not be used as the sole basis for treatment or other patient management decisions. A negative result may occur with  improper specimen collection/handling, submission of specimen other than nasopharyngeal swab, presence of viral mutation(s) within the areas targeted by this assay, and inadequate number of viral copies (<131 copies/mL). A negative result must be combined with clinical observations, patient history, and epidemiological information. The expected result is Negative. Fact Sheet for Patients:  PinkCheek.be Fact Sheet for Healthcare Providers:  GravelBags.it This test is not yet ap proved or cleared by the [place] FDA and  has been authorized for detection and/or diagnosis of SARS-CoV-2 by FDA under an Emergency Use Authorization (EUA). This EUA will remain  in effect (meaning this test can be used) for the duration of the COVID-19 declaration under Section 564(b)(1) of the Act, 21 U.S.C. section 360bbb-3(b)(1), unless the authorization is terminated or revoked sooner.    Influenza A by PCR NEGATIVE NEGATIVE Final   Influenza B by PCR NEGATIVE NEGATIVE Final    Comment: (NOTE) The Xpert Xpress SARS-CoV-2/FLU/RSV assay  is intended as an aid in  the diagnosis of influenza from Nasopharyngeal swab specimens and  should not be used as a sole basis for treatment. Nasal washings and  aspirates are unacceptable for Xpert Xpress SARS-CoV-2/FLU/RSV  testing. Fact Sheet  for Patients: PinkCheek.be Fact Sheet for Healthcare Providers: GravelBags.it This test is not yet approved or cleared by the [place] FDA and  has been authorized for detection and/or diagnosis of SARS-CoV-2 by  FDA under an Emergency Use Authorization (EUA). This EUA will remain  in effect (meaning this test can be used) for the duration of the  Covid-19 declaration under Section 564(b)(1) of the Act, 21  U.S.C. section 360bbb-3(b)(1), unless the authorization is  terminated or revoked. Performed at [facility], [place] [address], [place], [place] [address]   Blood culture (routine x 2)     Status: None (Preliminary result)   Collection Time: 05/28/19  1:00 PM   Specimen: BLOOD  Result Value Ref Range Status   Specimen Description   Final    BLOOD RIGHT ANTECUBITAL Performed at [place] [address], [place], [place] [address]    Special Requests   Final    BOTTLES DRAWN AEROBIC AND ANAEROBIC Blood Culture results may not be optimal due to an excessive volume of blood received in culture bottles Performed at [place] [address] [place], [place] [address]    Culture   Final    NO GROWTH 2 DAYS Performed at [place] [address], [place], [place] [address]    Report Status PENDING  Incomplete  Blood culture (routine x 2)     Status: None (Preliminary result)   Collection Time: 05/28/19  1:20 PM   Specimen: BLOOD  Result Value Ref Range Status   Specimen Description   Final    BLOOD LEFT ANTECUBITAL Performed at [place] [address], [place], [place] [address]     Special Requests   Final    BOTTLES DRAWN AEROBIC AND ANAEROBIC Blood Culture adequate volume Performed at [place] [address] [place], [place] [address]    Culture   Final    NO GROWTH 2 DAYS Performed at [place] [address], [place], [place] [address]    Report Status PENDING  Incomplete  Urine culture     Status: None   Collection Time: 05/28/19  4:27 PM   Specimen: In/Out Cath Urine  Result Value Ref Range Status   Specimen Description   Final    IN/OUT CATH URINE Performed at [place] [address] [place], [place] [address]    Special Requests   Final    NONE Performed at [facility], [place] [address] [place], [place] [address]    Culture   Final    NO GROWTH Performed at [facility], [place] [address], [place], [place] [address]    Report Status 05/29/2019 FINAL  Final  C Difficile Quick Screen w PCR reflex     Status: None   Collection Time: 05/29/19 12:34 AM   Specimen: STOOL  Result Value Ref Range Status   C Diff antigen NEGATIVE NEGATIVE Final   C Diff toxin NEGATIVE NEGATIVE Final   C Diff interpretation No C. difficile detected.  Final    Comment: Performed at [facility], [place] [address] [place],  [address]  Body fluid culture     Status: None (Preliminary result)   Collection Time: 05/29/19  4:29 PM   Specimen: Synovium; Body Fluid  Result Value Ref Range Status   Specimen Description   Final    SYNOVIAL LEFT HIP Performed at [facility]  [place] [address] [place], [place] [address]    Special Requests   Final    NONE Performed at [facility], [place] [address], [place], [place] [address]    Gram Stain   Final    ABUNDANT WBC PRESENT,BOTH PMN AND MONONUCLEAR NO ORGANISMS SEEN Gram Stain Report Called to,Read Back By and Verified With: C.FRANKLIN AT 1736 ON 05/29/19 BY N.THOMPSON Performed at  [facility], [place] [address], [place], [place] [address]    Culture   Final    NO GROWTH < 24 HOURS Performed at [place] [address], [place], [place] [address]    Report Status PENDING  Incomplete  Anaerobic culture     Status: None (Preliminary result)   Collection Time: 05/29/19  4:29 PM   Specimen: Synovium; Synovial Fluid  Result Value Ref Range Status   Specimen Description   Final    SYNOVIAL LEFT HIP Performed at [place] [address], [place], [place] [address]    Special Requests   Final    NONE Performed at [facility], [place] [address] [place], [place] [address]    Gram Stain   Final    ABUNDANT WBC PRESENT,BOTH PMN AND MONONUCLEAR NO ORGANISMS SEEN Performed at [facility], [place] [address], [place], [place] [address]    Culture PENDING  Incomplete   Report Status PENDING  Incomplete    Studies/Results: CT ABDOMEN PELVIS WO CONTRAST  Result Date: 05/29/2019 CLINICAL DATA:  Hematuria EXAM: CT ABDOMEN AND PELVIS WITHOUT CONTRAST TECHNIQUE: Multidetector CT imaging of the abdomen and pelvis was performed following the standard protocol without IV contrast. COMPARISON:  None FINDINGS: Lower chest: Trace pleural effusions and bibasilar atelectasis. Hepatobiliary: No focal liver abnormality is seen. Status post cholecystectomy. No biliary dilatation. Pancreas: Unremarkable. Spleen: Unremarkable. Adrenals/Urinary Tract: Hyperdensity within the bladder likely reflecting hemorrhage. Air is present secondary to Foley catheter placement. Portions of the bladder are obscured by artifact. Left greater than right renal cortical scarring. No hydronephrosis. Stomach/Bowel: Small hiatal hernia. Stomach is otherwise unremarkable. Bowel is normal in caliber. Vascular/Lymphatic: Aortic atherosclerosis. No enlarged abdominal or pelvic lymph nodes. Reproductive: Obscured by artifact Other: No ascites.  Musculoskeletal: Partially imaged collection of the anterior left hip better evaluated on concurrent dedicated imaging. Degenerative changes of the lumbar spine. Bilateral total hip arthroplasties. IMPRESSION: Moderate hemorrhage within the bladder.  Foley catheter is present. Electronically Signed   By: Macy Mis M.D.   On: 05/29/2019 11:55   CT HIP LEFT WO CONTRAST  Result Date: 05/29/2019 CLINICAL DATA:  Hip replacement, question of infection, high fevers EXAM: CT OF THE LEFT HIP WITHOUT CONTRAST TECHNIQUE: Multidetector CT imaging of the left hip was performed according to the standard protocol. Multiplanar CT image reconstructions were also generated. COMPARISON:  May 28, 2019 radiograph, CT May 08, 2019 FINDINGS: Bones/Joint/Cartilage The patient is status post left total hip arthroplasty. No periprosthetic lucency or fracture is seen. No hip joint effusion is noted. The femoral head component is well seated within the acetabular component. Ligaments Suboptimally assessed by CT. Muscles and Tendons Head are within or adjacent to the tensor fascia lata there is a multilocular fluid collection containing foci of air measuring approximately 4.7 x 5.3 by 9.7 cm extending along the anterolateral leg. There is mild fatty atrophy of the remainder of the muscles surrounding the hip. Limited visualization of the tendons is seen. The previously seen multilocular collection  along the posterior hip is no longer identified. Soft tissues There appears to be a hyperdense material layering in the posterior bladder which could represent layering blood products. The bladder appears to be markedly distended. Surgical suture seen along the lower left anterior inguinal wall. There is subcutaneous edema along the lateral aspect of the hip. IMPRESSION: 1. There remains a multilocular complex air and fluid collection within the anterior hip measuring approximately 4.7 x 5.3 x 9.7 cm extending along the anterior thigh,  concerning for multilocular abscess. 2. Interval resolution of the posterior multilocular collection seen on prior CT of 4 9 2021. 3. Hyperdense probable blood products within the posterior bladder with a markedly distended bladder. Electronically Signed   By: Prudencio Pair M.D.   On: 05/29/2019 02:21      Assessment/Plan:  INTERVAL HISTORY: sp IR guided aspiration w 20K WBC   Active Problems:   Sepsis ([place])   Fever   Hematuria, gross    Casey [name] is a 86 y.o. male with  with left hip streptococcal prosthetic joint infection and left thigh abscess in mid April currently on penicillin IV but in the last week having high fevers and hyper/hypoglycemia concerning for ongoing infection Imaging suggests large fluid collection to anterior hip unclear if abscess vs infected hematoma vs. Hematoma.IR aspirate with 20500 WBC 91% PMNS (vs 274K WBC and 91% PMNs 20 days ago)  Certainly [id number] wbc with prosthetic joint is c/w infection  Given the total clinical picture I would think he DOES need repeat surgery on hip.  Yes the WBC in joint is improved but his fevers and other symptoms suggest this is not surgically controlled. Idoubt culture will be helfpul here since PCN should be able to stop organism growing in media  I will followup culture data tomorrow and Dr Baxter Flattery is back on Monday.   LOS: 2 days   Alcide Evener 05/30/2019, 4:35 PM

## 2019-05-30 NOTE — Progress Notes (Signed)
Initial Nutrition Assessment  RD working remotely.   DOCUMENTATION CODES:   Not applicable  INTERVENTION:  - continue Ensure Enlive BID, each supplement provides 350 kcal and 20 grams of protein.   NUTRITION DIAGNOSIS:   Increased nutrient needs related to acute illness as evidenced by estimated needs.  GOAL:   Patient will meet greater than or equal to 90% of their needs  MONITOR:   PO intake, Supplement acceptance, Labs, Weight trends  REASON FOR ASSESSMENT:   Malnutrition Screening Tool  ASSESSMENT:   86 y.o. male with medical history of DM, hypothyroidism, HTN, stage 3 CKD, anemia, and L hip replacement in 11/2018. He [facility] d/t concern for joint effusion and septic arthritis. I&D performed to L hip joint and he was discharged home on 6 week course of IV abx. He received the second dose of COVID vaccine on 4/22 and has since had intermittent fever, chills, and 5-day hx of 2-3 bouts of loose stools/day. No N/V or abdominal pain PTA.  Diet changed from Carb Modified to NPO at midnight; no intakes documented since admission. Ensure Enlive was ordered BID and patient to receive first supplement following diet advancement to at least FLD. He has not been seen by a [place] RD at any time in the past.  Per chart review, weight today is 161 lb and weight on 12/23/18 was 168 lb. This indicates 7 lb weight loss (4.2% body weight) in the past 5 months; not significant for time frame.   Per notes: - s/p bedside ultrasound-guided aspiration of L thigh abscess (10 ml) this AM - sepsis - AKI on stage 3 CKD - chronic anemia d/t CKD   Labs reviewed; CBG: 128 mg/dl, BUN: 24 mg/dl, creatinine: 1.36 mg/dl, GFR: 46 ml/min. Medications reviewed; sliding scale novolog, 25 mcg oral synthroid/day, 4 g IV Mg sulfate x1 run 5/1, daily multivitamin with minerals, 1 g lovaza/day, 650 mg oral sodium bicarb TID.  IVF; D5-NS @ 75 ml/hr (306 kcal).    NUTRITION -  FOCUSED PHYSICAL EXAM:  unable to complete at this time.   Diet Order:   Diet Order            Diet NPO time specified Except for: Sips with Meds  Diet effective midnight              EDUCATION NEEDS:   No education needs have been identified at this time  Skin:  Skin Assessment: Skin Integrity Issues: Skin Integrity Issues:: Stage II Stage II: coccyx  Last BM:  PTA/unknown  Height:   Ht Readings from Last 1 Encounters:  05/30/19 5\' 6"  (1.676 m)    Weight:   Wt Readings from Last 1 Encounters:  05/30/19 73.3 kg    Ideal Body Weight:  64.5 kg  BMI:  Body mass index is 26.07 kg/m.  Estimated Nutritional Needs:   Kcal:  1700-1900 kcal  Protein:  80-90 grams  Fluid:  >/= 1.8 L/day     Jarome Matin, MS, RD, LDN, CNSC Inpatient Clinical Dietitian RD pager # available in [facility]  After hours/weekend pager # available in [facility]

## 2019-05-31 DIAGNOSIS — T8452XA Infection and inflammatory reaction due to internal left hip prosthesis, initial encounter: Secondary | ICD-10-CM | POA: Diagnosis not present

## 2019-05-31 DIAGNOSIS — M009 Pyogenic arthritis, unspecified: Secondary | ICD-10-CM | POA: Diagnosis not present

## 2019-05-31 LAB — BASIC METABOLIC PANEL
Anion gap: 3 — ABNORMAL LOW (ref 5–15)
BUN: 19 mg/dL (ref 8–23)
CO2: 23 mmol/L (ref 22–32)
Calcium: 8.7 mg/dL — ABNORMAL LOW (ref 8.9–10.3)
Chloride: 107 mmol/L (ref 98–111)
Creatinine, Ser: 1.21 mg/dL (ref 0.61–1.24)
GFR calc Af Amer: >60 mL/min (ref >60)
GFR calc non Af Amer: 54 mL/min — ABNORMAL LOW (ref >60)
Glucose, Bld: 143 mg/dL — ABNORMAL HIGH (ref 70–99)
Potassium: 3.5 mmol/L (ref 3.5–5.1)
Sodium: 133 mmol/L — ABNORMAL LOW (ref 135–145)

## 2019-05-31 LAB — HEMOGLOBIN AND HEMATOCRIT, BLOOD
HCT: 28.2 % — ABNORMAL LOW (ref 39.0–52.0)
Hemoglobin: 9 g/dL — ABNORMAL LOW (ref 13.0–17.0)

## 2019-05-31 LAB — CBC
HCT: 22.7 % — ABNORMAL LOW (ref 39.0–52.0)
Hemoglobin: 6.9 g/dL — CL (ref 13.0–17.0)
MCH: 26.5 pg (ref 26.0–34.0)
MCHC: 30.4 g/dL (ref 30.0–36.0)
MCV: 87.3 fL (ref 80.0–100.0)
Platelets: 282 10*3/uL (ref 150–400)
RBC: 2.6 MIL/uL — ABNORMAL LOW (ref 4.22–5.81)
RDW: 15.7 % — ABNORMAL HIGH (ref 11.5–15.5)
WBC: 4.7 10*3/uL (ref 4.0–10.5)
nRBC: 0 % (ref 0.0–0.2)

## 2019-05-31 LAB — GLUCOSE, CAPILLARY
Glucose-Capillary: 136 mg/dL — ABNORMAL HIGH (ref 70–99)
Glucose-Capillary: 212 mg/dL — ABNORMAL HIGH (ref 70–99)
Glucose-Capillary: 164 mg/dL — ABNORMAL HIGH (ref 70–99)
Glucose-Capillary: 162 mg/dL — ABNORMAL HIGH (ref 70–99)

## 2019-05-31 LAB — PREPARE RBC (CROSSMATCH)

## 2019-05-31 LAB — MAGNESIUM: Magnesium: 2 mg/dL (ref 1.7–2.4)

## 2019-05-31 MED ORDER — DIPHENHYDRAMINE HCL 25 MG PO CAPS
25.0000 mg | ORAL_CAPSULE | Freq: Once | ORAL | Status: AC
  Administered 2019-05-31: 25 mg via ORAL
  Filled 2019-05-31: qty 1

## 2019-05-31 MED ORDER — SODIUM CHLORIDE 0.9% IV SOLUTION: Freq: Once | INTRAVENOUS | Status: AC

## 2019-05-31 MED ORDER — ACETAMINOPHEN 325 MG PO TABS
650.0000 mg | ORAL_TABLET | Freq: Once | ORAL | Status: AC
  Administered 2019-05-31: 650 mg via ORAL
  Filled 2019-05-31: qty 2

## 2019-05-31 MED ORDER — POTASSIUM CHLORIDE CRYS ER 20 MEQ PO TBCR
40.0000 meq | EXTENDED_RELEASE_TABLET | Freq: Once | ORAL | Status: AC
  Administered 2019-05-31: 40 meq via ORAL
  Filled 2019-05-31: qty 2

## 2019-05-31 MED ORDER — VANCOMYCIN HCL IN DEXTROSE 1-5 GM/200ML-% IV SOLN
1000.0000 mg | INTRAVENOUS | Status: DC
  Administered 2019-05-31 – 2019-06-04 (×5): 1000 mg via INTRAVENOUS
  Filled 2019-05-31 (×5): qty 200

## 2019-05-31 MED ORDER — FUROSEMIDE 10 MG/ML IJ SOLN
20.0000 mg | Freq: Once | INTRAMUSCULAR | Status: AC
  Administered 2019-05-31: 20 mg via INTRAVENOUS
  Filled 2019-05-31: qty 2

## 2019-05-31 MED ORDER — BELLADONNA ALKALOIDS-OPIUM 16.2-60 MG RE SUPP
1.0000 | Freq: Four times a day (QID) | RECTAL | Status: DC | PRN
  Administered 2019-05-31 – 2019-06-01 (×2): 1 via RECTAL
  Filled 2019-05-31 (×2): qty 1

## 2019-05-31 MED ORDER — HEPARIN (PORCINE) 25000 UT/250ML-% IV SOLN
1000.0000 [IU]/h | INTRAVENOUS | Status: DC
  Administered 2019-05-31: 1000 [IU]/h via INTRAVENOUS
  Filled 2019-05-31: qty 250

## 2019-05-31 NOTE — [place] FL2 (Signed)
[place] MEDICAID FL2 LEVEL OF CARE SCREENING TOOL     IDENTIFICATION  Patient Name: Casey [name] Birthdate: 12-Sep-1929 Sex: male Admission Date (Current Location): 05/28/2019  [facility] and [place] Number:  Herbalist and Address:  [facility] [address],  [place], [place]      Provider Number: [id number]  Attending Physician Name and Address:  Eugenie Filler, MD  Relative Name and Phone Number:  Melody Geralynn Ochs - daughter at [phone]    Current Level of Care: Hospital Recommended Level of Care: [place] Prior Approval Number:    Date Approved/Denied:   PASRR Number: [id number]  Discharge Plan: SNF    Current Diagnoses: Patient Active Problem List   Diagnosis Date Noted  . Status post hip surgery   . Fever 05/29/2019  . Hematuria, gross 05/29/2019  . Prosthetic joint infection of left hip ([place])   . Benign prostatic hyperplasia   . AKI (acute kidney injury) ([place])   . Acute urinary retention   . Sepsis ([place]) 05/28/2019  . Left hip prosthetic joint infection ([place]) 05/12/2019  . Pressure injury of skin 05/11/2019  . Septic arthritis ([place]) 05/09/2019  . LV (left ventricular) mural thrombus 05/09/2019  . CKD (chronic kidney disease), stage III 05/09/2019  . Anemia 05/09/2019  . Primary osteoarthritis of left hip 12/03/2018  . Guaiac + stool 06/28/2017  . Diarrhea 08/11/2012  . CAD (coronary artery disease) of artery bypass graft 08/11/2012  . Diabetes ([place]) 08/11/2012  . High cholesterol 08/11/2012    Orientation RESPIRATION BLADDER Height & Weight     Self, Time, Situation, Place  Normal(Room air) Continent Weight: 163 lb 6.4 oz (74.1 kg) Height:  5\' 6"  (167.6 cm)  BEHAVIORAL SYMPTOMS/MOOD NEUROLOGICAL BOWEL NUTRITION STATUS      Continent Diet  AMBULATORY STATUS COMMUNICATION OF NEEDS Skin   Total Care Verbally Normal                       Personal Care Assistance Level of Assistance  Bathing,  Dressing, Total care, Feeding Bathing Assistance: Maximum assistance Feeding assistance: Independent Dressing Assistance: Limited assistance Total Care Assistance: Maximum assistance   Functional Limitations Info  Speech, Hearing, Sight Sight Info: Adequate Hearing Info: Adequate Speech Info: Adequate    SPECIAL CARE FACTORS FREQUENCY  PT (By licensed PT), OT (By licensed OT)     PT Frequency: 5x per week OT Frequency: 5x per week            Contractures Contractures Info: Not present    Additional Factors Info  Allergies   Allergies Info: Sulfa Antibiotics           Current Medications (05/31/2019):  This is the current hospital active medication list Current Facility-Administered Medications  Medication Dose Route Frequency Provider Last Rate Last Admin  . 0.9 %  sodium chloride infusion (Manually program via Guardrails IV Fluids)   Intravenous Once Lovey Newcomer T, NP      . acetaminophen (TYLENOL) tablet 650 mg  650 mg Oral Q6H PRN Jenkins Rouge, MD       Or  . acetaminophen (TYLENOL) suppository 650 mg  650 mg Rectal Q6H PRN Rawla, Prashanth, MD      . cefTRIAXone (ROCEPHIN) 2 g in sodium chloride 0.9 % 100 mL IVPB  2 g Intravenous Q24H Carlyle Basques, MD 200 mL/hr at 05/30/19 2155 2 g at 05/30/19 2155  . Chlorhexidine Gluconate Cloth 2 % PADS 6 each  6 each Topical Daily Eugenie Filler, MD   6 each at 05/30/19 1100  . cholecalciferol (VITAMIN D) tablet 5,000 Units  5,000 Units Oral Daily Jenkins Rouge, MD   5,000 Units at 05/31/19 0814  . feeding supplement (ENSURE ENLIVE) (ENSURE ENLIVE) liquid 237 mL  237 mL Oral BID BM Eugenie Filler, MD   237 mL at 05/30/19 1600  . fenofibrate tablet 160 mg  160 mg Oral Daily Rawla, Ethelene Hal, MD   160 mg at 05/31/19 0813  . finasteride (PROSCAR) tablet 5 mg  5 mg Oral Daily Rawla, Prashanth, MD   5 mg at 05/31/19 0813  . furosemide (LASIX) injection 20 mg  20 mg Intravenous Once Eugenie Filler, MD      .  insulin aspart (novoLOG) injection 0-9 Units  0-9 Units Subcutaneous TID WC Jenkins Rouge, MD   1 Units at 05/31/19 0820  . levothyroxine (SYNTHROID) tablet 25 mcg  25 mcg Oral QAC breakfast Jenkins Rouge, MD   25 mcg at 05/31/19 0443  . lisinopril (ZESTRIL) tablet 2.5 mg  2.5 mg Oral Daily Rawla, Prashanth, MD   2.5 mg at 05/30/19 1051  . loperamide (IMODIUM) capsule 2 mg  2 mg Oral PRN Eugenie Filler, MD      . methocarbamol (ROBAXIN) tablet 500 mg  500 mg Oral Q6H PRN Jenkins Rouge, MD      . metoprolol succinate (TOPROL-XL) 24 hr tablet 25 mg  25 mg Oral Daily Rawla, Prashanth, MD   25 mg at 05/30/19 1055  . multivitamin with minerals tablet 1 tablet  1 tablet Oral Daily Jenkins Rouge, MD   1 tablet at 05/31/19 0814  . multivitamins with iron tablet 1 tablet  1 tablet Oral Daily Jenkins Rouge, MD   1 tablet at 05/31/19 0813  . omega-3 acid ethyl esters (LOVAZA) capsule 1 g  1 capsule Oral Daily Rawla, Prashanth, MD   1 g at 05/31/19 0814  . ondansetron (ZOFRAN) tablet 4 mg  4 mg Oral Q6H PRN Jenkins Rouge, MD       Or  . ondansetron (ZOFRAN) injection 4 mg  4 mg Intravenous Q6H PRN Rawla, Prashanth, MD      . senna-docusate (Senokot-S) tablet 1 tablet  1 tablet Oral QHS PRN Rawla, Prashanth, MD      . sodium bicarbonate tablet 650 mg  650 mg Oral TID Jenkins Rouge, MD   650 mg at 05/31/19 0814  . sodium chloride flush (NS) 0.9 % injection 10-40 mL  10-40 mL Intracatheter Q12H Eugenie Filler, MD   10 mL at 05/30/19 2149  . sodium chloride flush (NS) 0.9 % injection 10-40 mL  10-40 mL Intracatheter PRN Eugenie Filler, MD      . tamsulosin [facility]) capsule 0.4 mg  0.4 mg Oral QPC supper Eugenie Filler, MD   0.4 mg at 05/30/19 1714  . vancomycin (VANCOCIN) IVPB 1000 mg/200 mL premix  1,000 mg Intravenous Q24H Lynelle Doctor, RPH         Discharge Medications: Please see discharge summary for a list of discharge medications.    Greg Cutter, LCSW

## 2019-05-31 NOTE — Progress Notes (Signed)
Urology Consult Progress Note   Interval/Subjective: Reports that he is overall doing well. He reports he understands the plan for orthopedics. No suprapubic pain or bladder spasms overnight.  Thinks his catheter is draining well and is becoming more clear.    Hemoglobin down to 6.9 this morning. Creatinine continues to trend down to 1.2 from admission of 1.6 Urine culture was without growth.  Hand irrigated and aspirated the bedside with return of several small flecks of clot which would have easily passed in the catheter.  Irrigated with 1 L of normal saline until urine was light pink and translucent.  Objective: Vital signs in last 24 hours: Temp:  [97.8 F (36.6 C)-98.7 F (37.1 C)] 97.8 F (36.6 C) (05/02 0955) Pulse Rate:  [55-89] 67 (05/02 1201) Resp:  [16-19] 16 (05/02 0955) BP: (110-144)/(43-63) 131/63 (05/02 1201) SpO2:  [96 %-98 %] 96 % (05/02 0955) Weight:  [74.1 kg] 74.1 kg (05/02 0438)  Intake/Output from previous day: 05/01 0701 - 05/02 0700 In: 2310.2 [P.O.:640; I.V.:1529.5; IV Piggyback:140.7] Out: 1550 [Urine:1550] Intake/Output this shift: Total I/O In: 360 [P.O.:360] Out: 375 [Urine:375]  Physical Exam:  Abdomen: Soft, no SP pain.  GU: Foley in place draining clear pink colored urine. No clots.   Lab Results: Recent Labs    05/30/19 0529 05/30/19 1455 05/31/19 0527  HGB 7.4* 7.4* 6.9*  HCT 23.9* 24.2* 22.7*   Recent Labs    05/30/19 0529 05/31/19 0527  NA 135 133*  K 3.9 3.5  CL 107 107  CO2 20* 23  GLUCOSE 140* 143*  BUN 24* 19  CREATININE 1.36* 1.21  CALCIUM 9.0 8.7*    Studies/Results: No results found.  Assessment/Plan:  86 y.o. 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  Urology was consulted for urinary retention.    Acute urinary retention in the setting of gross hematuria He has a history of urinary retention  requiring catheter placements.  He has followed up in clinic and has passed trial void in the past.  He has lower urinary tract symptoms and a known enlarged prostate.  He is on chronic anticoagulation for thrombus.  CT scan demonstrated intravesical clot.  Foley catheter was placed without issues and has been draining well. Urine culture was without growth. Likely etiology is traumatic catheterization while on anticoagulation.  5/2 Hand irrigated and aspirated the bedside after Hgb of 6.9 with return of several small flecks of clot which would have easily passed in the catheter.  Irrigated with 1 L of normal saline until urine was light pink and translucent.  No not suspect active GU bleeding.   -Continue Foley catheter drainage. Slightly positional, if not flowing, Nurse to flush as needed for symptoms/obstruction, if fails flushes would replace with 20Fr coude catheter.  -Notify urology if unable to irrigate free if new catheter is placed, may need to consider bedside hand irrigation/CBI if symptoms become more problematic -Follow-up as an outpatient for Foley catheter removal -Will require outpatient hematuria evaluation  History of bladder cancer Reported history of bladder cancer in the mid 2000s; unclear stage.  Reports that he underwent routine cystoscopies for several years which were normal.  CT scan (noncontrasted) from 05/29/2019 with no lymphadenopathy but with large intravesical clot, cannot exclude bladder mass.  -Follow-up for outpatient evaluation   LOS: 3 days

## 2019-05-31 NOTE — Progress Notes (Signed)
ANTICOAGULATION CONSULT NOTE - Initial Consult  Pharmacy Consult for IV heparin (note on Xarelto 10mg  daily PTA) Indication: LV thrombus  Allergies  Allergen Reactions  . Sulfa Antibiotics     unknown    Patient Measurements: Height: 5\' 6"  (167.6 cm) Weight: 74.1 kg (163 lb 6.4 oz) IBW/kg (Calculated) : 63.8  Heparin dosing weight: 74kg  Vital Signs: Temp: 98.2 F (36.8 C) (05/02 1447) Temp Source: Oral (05/02 1447) BP: 134/58 (05/02 1447) Pulse Rate: 70 (05/02 1447)  Labs: Recent Labs    05/29/19 0540 05/29/19 0540 05/30/19 0529 05/30/19 0529 05/30/19 1455 05/31/19 0527  HGB 8.0*   < > 7.4*   < > 7.4* 6.9*  HCT 25.8*   < > 23.9*  --  24.2* 22.7*  PLT 279  --  292  --   --  282  CREATININE 1.49*  --  1.36*  --   --  1.21   < > = values in this interval not displayed.    Estimated Creatinine Clearance: 38.8 mL/min (by C-G formula based on SCr of 1.21 mg/dL).   Medical History: Past Medical History:  Diagnosis Date  . Anemia   . Arthritis    "all over" all joints  . Bladder cancer (H[facility])    bladder  . Blood transfusion 2008  . Chronic kidney disease    bladder ca in 2008;Kid. failure stage 3  . Complication of anesthesia    I[place] post op  . Coronary artery disease   . CVA (cerebral vascular accident) ([place]) 03/04/2019  . Diabetes mellitus   . DJD (degenerative joint disease)   . Guaiac + stool 06/28/2017  . Heart murmur   . Hypothyroidism    hyper thyroidism recently  . Ileus ([place]) 2001   post hip replacement  . Metabolic acidosis   . Metabolic bone disease   . Myocardial infarction [facility]) age 17    Medications:  Scheduled:  . Chlorhexidine Gluconate Cloth  6 each Topical Daily  . cholecalciferol  5,000 Units Oral Daily  . feeding supplement (ENSURE ENLIVE)  237 mL Oral BID BM  . fenofibrate  160 mg Oral Daily  . finasteride  5 mg Oral Daily  . insulin aspart  0-9 Units Subcutaneous TID WC  . levothyroxine  25 mcg Oral QAC breakfast  .  lisinopril  2.5 mg Oral Daily  . metoprolol succinate  25 mg Oral Daily  . multivitamin with minerals  1 tablet Oral Daily  . multivitamins with iron  1 tablet Oral Daily  . omega-3 acid ethyl esters  1 capsule Oral Daily  . sodium bicarbonate  650 mg Oral TID  . sodium chloride flush  10-40 mL Intracatheter Q12H  . tamsulosin  0.4 mg Oral QPC supper   Infusions:  . cefTRIAXone (ROCEPHIN)  IV 2 g (05/30/19 2155)  . vancomycin 1,000 mg (05/31/19 1318)    Assessment: 86 yo male on Xarelto 10mg  once daily for LV thrombus prior to admission that was placed on hold per urology due to hematuria. Per Md, urology states that pt with no more hematuria so safe to restart some anticoagulation. Will dose IV Heparin per pharmacy with no bolus per orders. Of note, patient also with PJI with plan for ortho procedure tomorrow 5/3 in PM so timing of when to hold the IV heparin will need to be established tomorrow. Hgb 6.9 and Plts 282. Note that patient had SQ heparin dose this AM at 0443.   Goal of Therapy:  Heparin level 0.3-0.7 units/ml Monitor platelets by anticoagulation protocol: Yes   Plan:   NO IV heparin bolus the  IV heparin rate of 1000 units/hr  Check heparin level 8 hours after start of IV heparin  Check closely for signs/sx's bleeding due to hematuria this admission  Kara Mead 05/31/2019,4:25 PM

## 2019-05-31 NOTE — Progress Notes (Signed)
PROGRESS NOTE    Casey [name]  [id number] DOB: [birth year] DOA: 05/28/2019 PCP: Casey Chroman, MD   Chief Complaint  Patient presents with   Hypoglycemia   Fever    Brief Narrative:  HPI per Dr. Nils Pyle Casey [name] is a 86 y.o. male with medical history significant of diabetes mellitus, hypothyroidism, hypertension, chronic kidney disease stage III, anemia. Patient has a hip surgery with left hip replacement in November 2020.  Patient was found to have multiloculated joint effusion concerning for septic arthritis and was admitted on 05/09/2019 to [facility], was seen in consultation by Dr. Percell Miller and Dr. 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  Patient states that he got his second dose of COVID-19 vaccine last Thursday, moderna vaccine. Patient has been experiencing intermittent fevers with a T-max at home of 102.9 Fahrenheit. His highest temperature here in the ER was 100.9 Fahrenheit. He has been on IV penicillin at home. He states he has been compliant with this. He denies any new hip pain.   Patient did mention that he has been having diarrhea 2-3 episodes, loose stools every day since the last for 5 days.  Also did complain of hypoglycemia with his blood glucose level dropping down to 20s, he stopped his oral medications and it has improved since then.  Patient complains of chills.  Patient denies any chest pain.  Denies any shortness of breath.  He does complain of some coughing with occasional sputum production which is clear.  Denies any abdominal pain, nausea or any vomitings.  Denies any hip pain.  Denies any discharge from the left hip site.  While the patient was in the ER he did have a bowel movement which was loose, not watery, there was some bright red blood in the bedside  commode.  Patient mentions that blood came out from his bowels and also some from his urine.  Patient did mention that he took his Xarelto this morning.   ED Course:  Haywood Pao spoke to Dr. Percell Miller regarding patient status, and also spoke with Dr Baxter Flattery ID.  Urinalysis was checked and no source of any infection was seen, chest x-ray showed no evidence of any pneumonia.  Patient was treated with broad-spectrum antibiotics with vancomycin, cefepime, Flagyl.  Assessment & Plan:   Active Problems:   Sepsis ([place])   Fever   Hematuria, gross   Status post hip surgery  1 sepsis, possibly likely secondary to septic arthritis of the hip POA/recent septic arthritis of the left hip prosthesis status post irrigation and debridement 05/12/2019 per Dr. Percell Miller Patient presenting and meeting criteria for sepsis with a fever with a temp of 102, lactic acid level of 1.9, elevated CRP, tachycardia.  Patient had presented with fever and chills prior to admission.  Initial concern for left hip infection.  CT of the left hip with concern for multiloculated abscess within the anterior hip, interval resolution of posterior multilocular collection seen on prior CT, hyperdense probable blood products within the posterior bladder with markedly distended bladder.  Orthopedics consulted and orthopedics recommended IR aspiration of fluid collection which was done on 05/29/2019 with cultures pending.  Aspirate noted to be turbid with elevated WBCs with neutrophils.  Continue IV vancomycin and IV Rocephin. Orthopedics recommending that left total hip replacement  should be resected with plans for two-stage treatment of his infected left total hip with removal of implant and placement of a antibiotic spacer with concurrent IV antibiotics of 6 weeks starting from time of surgery.  Orthopedics planning to take patient to the OR on Monday afternoon to perform resection.  Per orthopedics.  Per ID.  2.  Gross hematuria/urinary  retention Patient noted to have a gross hematuria and noted to have some urinary retention overnight.  Foley catheter placed with 1 L of bloody urine immediately coming out per RN.  Anticoagulation on hold.  Case discussed with urology who recommended CT of the abdomen and pelvis which showed moderate hemorrhage within the bladder.  Foley catheter present.  Patient seen by urology who recommended flushing Foley as needed for symptoms/obstruction and recommending continue Foley catheter on discharge per urology with outpatient follow-up.  Due to drop in hemoglobin down to 6.9 urology reassessed the patient and irrigated and aspirated at bedside with return of several small flecks of clot which per urology could easily passed in the catheter and irrigated until urine was light pink and translucent.  Per urology they do not suspect patient has an active ongoing GU bleeding.  Follow H&H.  Transfusion threshold hemoglobin < 7.   3.  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  Urology consulted.  Urology to determine when okay to resume anticoagulation.  Orthopedics assessing to see whether patient may need surgery.  Spoke with urology feel patient does not have any active ongoing GI bleed at this time and okay with starting patient on heparin without a bolus.   4.  Acute kidney injury on chronic kidney disease stage IIIb, POA Avoid nephrotoxic agents.  Renal function improved with hydration.  Currently at baseline with a creatinine of 1.21.  Saline lock IV fluids.  Follow.  5.  Chronic anemia Likely secondary to chronic kidney disease.  Follow H&H.  6.  BPH Continue Flomax.  Patient now with indwelling Foley catheter which patient will be discharged home on with outpatient follow-up with urology.  7.  Hypothyroidism Continue Synthroid.  8.  Diabetes mellitus type 2 Hemoglobin A1c 7.6  (05/10/2019).  CBG of 136 this morning.  Continue to hold oral hypoglycemic agents.  Sliding scale insulin.    9.  Nonobstructive coronary artery disease Continue ACE inhibitor, beta-blocker, Lovaza.    10.  Diarrhea C. difficile PCR negative.  Continue probiotics.  Imodium as needed.  11.  Acute blood loss anemia Secondary to gross hematuria.  Hemoglobin currently at 6.9.  Transfused 2 units packed red blood cells.  Due to drop in hemoglobin and ongoing gross hematuria urology reassessed patient today and feel the patient's hematuria is secondary to traumatic catheterization while on anticoagulation.  Urology and irrigated and aspirated at bedside with return of several small flecks of clot which per urology could easily passed in the catheter and irrigated until urine was light pink and translucent.  Per urology they do not suspect patient has an active GU bleeding.  Follow H&H.  Transfusion threshold hemoglobin < 7.   DVT prophylaxis: SCDs/patient with gross hematuria. Code Status: Full Family Communication: Updated patient and son at bedside.. Disposition:   Status is: Inpatient    Dispo: The patient is from: Home              Anticipated d/c is to: To be determined however likely home with home health.  Anticipated d/c date is: To be determined              Patient currently admitted with probable septic hip, currently on IV antibiotics, also with gross hematuria, was on chronic anticoagulation for LV thrombus however currently on hold secondary to hematuria and possibility of hip surgery.  Consultations with orthopedics, ID and urology ff        Consultants:   Orthopedics: Dr. Percell Miller 05/29/2019  ID: Dr. Baxter Flattery 05/29/2019  Urology: Dr. Louis Meckel 05/29/2019  Procedures:   CT abdomen and pelvis 05/29/2019  CT left hip 05/29/2019  Chest x-ray 05/28/2019  Plain films of the left hip 05/28/2019  Transfuse 2 units packed red blood cells 05/31/2019  Antimicrobials:    IV vancomycin 05/28/2019  IV cefepime 05/28/2019>>>> 05/29/2019  IV Flagyl 05/28/2019>>>> 05/29/2019  IV Rocephin 05/29/2019   Subjective: Patient in bed.  Son at bedside.  Denies any chest pain or shortness of breath.  States Foley catheter was changed by urology and he feels hematuria slowly improving.  Dark red blood noted in Foley bag.    Objective: Vitals:   05/31/19 1201 05/31/19 1307 05/31/19 1432 05/31/19 1447  BP: 131/63 (!) 128/56 134/63 (!) 134/58  Pulse: 67 82 82 70  Resp:  19 20 20   Temp:  98.1 F (36.7 C) 98.4 F (36.9 C) 98.2 F (36.8 C)  TempSrc:  Oral Oral Oral  SpO2:  98% 99% 99%  Weight:      Height:        Intake/Output Summary (Last 24 hours) at 05/31/2019 1559 Last data filed at 05/31/2019 1448 Gross per 24 hour  Intake 2702.1 ml  Output 1950 ml  Net 752.1 ml   Filed Weights   05/29/19 0144 05/30/19 0321 05/31/19 0438  Weight: 73.5 kg 73.3 kg 74.1 kg    Examination:  General exam: NAD Respiratory system: Lungs clear to auscultation bilaterally anterior lung fields.  No wheezing, no crackles, no rhonchi.  Normal respiratory effort.  Cardiovascular system: RRR no murmurs rubs or gallops.  No JVD.  No lower extremity edema.  Gastrointestinal system: Abdomen is nontender, nondistended, soft, positive bowel sounds.  No rebound.  No guarding. Central nervous system: Alert and oriented. No focal neurological deficits. Extremities: Symmetric 5 x 5 power. Skin: No rashes, lesions or ulcers Psychiatry: Judgement and insight appear normal. Mood & affect appropriate.     Data Reviewed: I have personally reviewed following labs and imaging studies  CBC: Recent Labs  Lab 05/28/19 1300 05/29/19 0540 05/30/19 0529 05/30/19 1455 05/31/19 0527  WBC 5.4 6.2 4.7  --  4.7  NEUTROABS 3.1  --   --   --   --   HGB 9.1* 8.0* 7.4* 7.4* 6.9*  HCT 29.5* 25.8* 23.9* 24.2* 22.7*  MCV 86.8 88.4 87.2  --  87.3  PLT 311 279 292  --  [id number]    Basic Metabolic  Panel: Recent Labs  Lab 05/28/19 1300 05/29/19 0540 05/30/19 0529 05/31/19 0527  NA 132* 133* 135 133*  K 4.8 4.2 3.9 3.5  CL 101 106 107 107  CO2 21* 19* 20* 23  GLUCOSE 111* 99 140* 143*  BUN 30* 26* 24* 19  CREATININE 1.62* 1.49* 1.36* 1.21  CALCIUM 10.0 9.3 9.0 8.7*  MG  --   --  1.7 2.0    GFR: Estimated Creatinine Clearance: 38.8 mL/min (by C-G formula based on SCr of 1.21 mg/dL).  Liver Function Tests: Recent Labs  Lab 05/28/19 1300  AST  22  ALT 12  ALKPHOS 33*  BILITOT 0.5  PROT 6.3*  ALBUMIN 2.5*    CBG: Recent Labs  Lab 05/30/19 1116 05/30/19 1706 05/30/19 2237 05/31/19 0743 05/31/19 1207  GLUCAP 142* 210* 139* 136* 212*     Recent Results (from the past 240 hour(s))  Respiratory Panel by RT PCR (Flu A&B, Covid) - Nasopharyngeal Swab     Status: None   Collection Time: 05/28/19  1:00 PM   Specimen: Nasopharyngeal Swab  Result Value Ref Range Status   SARS Coronavirus 2 by RT PCR NEGATIVE NEGATIVE Final    Comment: (NOTE) SARS-CoV-2 target nucleic acids are NOT DETECTED. The SARS-CoV-2 RNA is generally detectable in upper respiratoy specimens during the acute phase of infection. The lowest concentration of SARS-CoV-2 viral copies this assay can detect is 131 copies/mL. A negative result does not preclude SARS-Cov-2 infection and should not be used as the sole basis for treatment or other patient management decisions. A negative result may occur with  improper specimen collection/handling, submission of specimen other than nasopharyngeal swab, presence of viral mutation(s) within the areas targeted by this assay, and inadequate number of viral copies (<131 copies/mL). A negative result must be combined with clinical observations, patient history, and epidemiological information. The expected result is Negative. Fact Sheet for Patients:  PinkCheek.be Fact Sheet for Healthcare Providers:   GravelBags.it This test is not yet ap proved or cleared by the [place] FDA and  has been authorized for detection and/or diagnosis of SARS-CoV-2 by FDA under an Emergency Use Authorization (EUA). This EUA will remain  in effect (meaning this test can be used) for the duration of the COVID-19 declaration under Section 564(b)(1) of the Act, 21 U.S.C. section 360bbb-3(b)(1), unless the authorization is terminated or revoked sooner.    Influenza A by PCR NEGATIVE NEGATIVE Final   Influenza B by PCR NEGATIVE NEGATIVE Final    Comment: (NOTE) The Xpert Xpress SARS-CoV-2/FLU/RSV assay is intended as an aid in  the diagnosis of influenza from Nasopharyngeal swab specimens and  should not be used as a sole basis for treatment. Nasal washings and  aspirates are unacceptable for Xpert Xpress SARS-CoV-2/FLU/RSV  testing. Fact Sheet for Patients: PinkCheek.be Fact Sheet for Healthcare Providers: GravelBags.it This test is not yet approved or cleared by the [place] FDA and  has been authorized for detection and/or diagnosis of SARS-CoV-2 by  FDA under an Emergency Use Authorization (EUA). This EUA will remain  in effect (meaning this test can be used) for the duration of the  Covid-19 declaration under Section 564(b)(1) of the Act, 21  U.S.C. section 360bbb-3(b)(1), unless the authorization is  terminated or revoked. Performed at [facility], [place] [address], [place], [place] [address]   Blood culture (routine x 2)     Status: None (Preliminary result)   Collection Time: 05/28/19  1:00 PM   Specimen: BLOOD  Result Value Ref Range Status   Specimen Description   Final    BLOOD RIGHT ANTECUBITAL Performed at [place] [address], [place], [place] [address]    Special Requests   Final    BOTTLES DRAWN AEROBIC AND ANAEROBIC Blood Culture results may  not be optimal due to an excessive volume of blood received in culture bottles Performed at [place] [address], [place], [place] [address]    Culture   Final    NO GROWTH 3 DAYS Performed at [facility], [address] [place], [place] [address]    Report Status PENDING  Incomplete  Blood culture (routine x 2)     Status: None (Preliminary result)   Collection Time: 05/28/19  1:20 PM   Specimen: BLOOD  Result Value Ref Range Status   Specimen Description   Final    BLOOD LEFT ANTECUBITAL Performed at [place] [address] [place], [place] [address]    Special Requests   Final    BOTTLES DRAWN AEROBIC AND ANAEROBIC Blood Culture adequate volume Performed at [place] [address] [place], [place] [address]    Culture   Final    NO GROWTH 3 DAYS Performed at [facility], [place] [address], [place], [place] [address]    Report Status PENDING  Incomplete  Urine culture     Status: None   Collection Time: 05/28/19  4:27 PM   Specimen: In/Out Cath Urine  Result Value Ref Range Status   Specimen Description   Final    IN/OUT CATH URINE Performed at [place] [address], [place], [place] [address]    Special Requests   Final    NONE Performed at [facility], [place] [address] [place], [place] [address]    Culture   Final    NO GROWTH Performed at [facility], [place] [address], [place], [place] [address]    Report Status 05/29/2019 FINAL  Final  C Difficile Quick Screen w PCR reflex     Status: None   Collection Time: 05/29/19 12:34 AM   Specimen: STOOL  Result Value Ref Range Status   C Diff antigen NEGATIVE NEGATIVE Final   C Diff toxin NEGATIVE NEGATIVE Final   C Diff interpretation No C. difficile detected.  Final    Comment: Performed at [facility], [place] [address] [place], [place] [address]  Body fluid  culture     Status: None (Preliminary result)   Collection Time: 05/29/19  4:29 PM   Specimen: Synovium; Body Fluid  Result Value Ref Range Status   Specimen Description   Final    SYNOVIAL LEFT HIP Performed at [place] [address], [place], [place] [address]    Special Requests   Final    NONE Performed at [facility], [place] [address], [place], [place] [address]    Gram Stain   Final    ABUNDANT WBC PRESENT,BOTH PMN AND MONONUCLEAR NO ORGANISMS SEEN Gram Stain Report Called to,Read Back By and Verified With: C.FRANKLIN AT 1736 ON 05/29/19 BY N.Chilton Sallade Performed at [name], [place] [address] [place], [place] [address]    Culture   Final    NO GROWTH 2 DAYS Performed at [place] [address] [place], [place] [address]    Report Status PENDING  Incomplete  Anaerobic culture     Status: None (Preliminary result)   Collection Time: 05/29/19  4:29 PM   Specimen: Synovium; Synovial Fluid  Result Value Ref Range Status   Specimen Description   Final    SYNOVIAL LEFT HIP Performed at [place] [address], [place],  [address]    Special Requests   Final    NONE Performed at [facility], [place] [address] [place], [place] [address]    Gram Stain   Final    ABUNDANT WBC PRESENT,BOTH PMN AND MONONUCLEAR NO ORGANISMS SEEN  Performed at [facility], [place] [address] [place], [place] [address]    Culture   Final    NO ANAEROBES ISOLATED; CULTURE IN PROGRESS FOR 5 DAYS   Report Status PENDING  Incomplete         Radiology Studies: No results found.      Scheduled Meds:  Chlorhexidine Gluconate Cloth  6 each Topical Daily   cholecalciferol  5,000 Units Oral Daily   feeding supplement (ENSURE ENLIVE)  237 mL Oral BID BM   fenofibrate  160 mg Oral Daily   finasteride  5 mg Oral Daily   insulin aspart  0-9 Units Subcutaneous TID WC    levothyroxine  25 mcg Oral QAC breakfast   lisinopril  2.5 mg Oral Daily   metoprolol succinate  25 mg Oral Daily   multivitamin with minerals  1 tablet Oral Daily   multivitamins with iron  1 tablet Oral Daily   omega-3 acid ethyl esters  1 capsule Oral Daily   sodium bicarbonate  650 mg Oral TID   sodium chloride flush  10-40 mL Intracatheter Q12H   tamsulosin  0.4 mg Oral QPC supper   Continuous Infusions:  cefTRIAXone (ROCEPHIN)  IV 2 g (05/30/19 2155)   vancomycin 1,000 mg (05/31/19 1318)     LOS: 3 days    Time spent: 40 minutes    Irine Seal, MD [facility]   To contact the attending provider between 7A-7P or the covering provider during after hours 7P-7A, please log into the web site [email or web address] and access using universal [employer] password for that web site. If you do not have the password, please call the hospital operator.  05/31/2019, 3:59 PM

## 2019-05-31 NOTE — Progress Notes (Signed)
Pharmacy Antibiotic Note  Casey [name] is a 86 y.o. male with hx CKD, left THA in Nov 2020 and subsequently developed septic hip arthritis (s/p I&D with plan to treat with Pen G for 6 weeks for group B strep infection - 05/13/19 to 06/24/19). He  presented to the ED on 05/28/2019 with c/o fever and hypoglycemia. Left hip CT on 4/30 showed "a multilocular complex air and fluid collection within the anterior hip extending along the anterior thigh, concerning for multilocular abscess."  He underwent left thigh abscess aspiration on 4/30. Pt's currently on vancomycin and ceftriaxone for infection.  Today, 05/31/2019: - Day #4 abx - afeb, wbc wnl -  scr trending down 1.21 (crcl~34) - all cultures wit this adm. have been neg thus far   Plan: - adjust vancomycin to 1000 mg IV q24h for est AUC 512 - continue ceftriaxone 2gm IV q24h - monitor renal function closely  __________________________________  Height: 5\' 6"  (167.6 cm) Weight: 74.1 kg (163 lb 6.4 oz) IBW/kg (Calculated) : 63.8  Temp (24hrs), Avg:98.6 F (37 C), Min:98.2 F (36.8 C), Max:98.7 F (37.1 C)  Recent Labs  Lab 05/28/19 1300 05/29/19 0540 05/30/19 0529 05/31/19 0527  WBC 5.4 6.2 4.7 4.7  CREATININE 1.62* 1.49* 1.36* 1.21  LATICACIDVEN 1.9  --   --   --     Estimated Creatinine Clearance: 38.8 mL/min (by C-G formula based on SCr of 1.21 mg/dL).    Allergies  Allergen Reactions  . Sulfa Antibiotics     unknown    Antimicrobials this admission: 4/29 cefepime >> 4/30 4/29 flagyl >>4/30 4/30 CTX>> 4/29 vancomycin >>   Microbiology results: 4/10 Abscess cx: rare Streptococcus agalactia  4/29 BCx:  ngtd 4/29 UCx: NGF 4/30 C.diff:neg 4/30 abscess L hip:  4/30 left hip synovial:  Thank you for allowing pharmacy to be a part of this patient's care.  Lynelle Doctor 05/31/2019 8:33 AM

## 2019-05-31 NOTE — Consult Note (Addendum)
Reason for Consult: septic left total hip replacement Referring Physician: Percell Miller, MD  Casey [name] is an 86 y.o. male.  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  He underwent an open excisional nonexcisional debridement of this left hip 2 weeks ago with polyethylene liner and head ball exchange.  He was subsequently placed on IV penicillin and discharged home.  He presented to the emergency room after having persistent pain and fever over the past week.  He has been compliant with antibiotic therapy via IV per ID recommendations.  He denies significant left hip pain.  He has been ambulating and bearing weight until admission.  Sepsis work-up initiated has not shown sign of UA, pneumonia, or C. difficile.  CT scan showed multilocular complex air and fluid collection within the anterior hip measuring approximately 4.7 x 5.3 x 9.7 cm extending along the anterior thigh, concerning for multilocular abscess.  Orthopedics was consulted for evaluation.    This patient is a patient of Dr. Fredonia Highland.  Dr. Percell Miller has contacted me for my thoughts and recommendations about the care of Casey [name].  I saw Casey [name] today.  He complains of left hip pain only.  We reviewed his desire to remain as active as possible including golfing.  We reviewed his history of recent stroke that occurred earlier this year.  The only thing he recognized was speech loss that he regained shortly thereafter.  No functional upper or lower extremity weakness.   Past Medical History:  Diagnosis Date  . Anemia   . Arthritis    "all over" all joints  . Bladder cancer (HCC)    bladder  . Blood transfusion 2008  . Chronic kidney disease    bladder ca in 2008;Kid. failure stage 3  . Complication of anesthesia    Ilios post op  . Coronary artery disease   . CVA (cerebral vascular accident) ([place]) 03/04/2019  . Diabetes mellitus    . DJD (degenerative joint disease)   . Guaiac + stool 06/28/2017  . Heart murmur   . Hypothyroidism    hyper thyroidism recently  . Ileus ([place]) 2001   post hip replacement  . Metabolic acidosis   . Metabolic bone disease   . Myocardial infarction [facility]) age 69    Past Surgical History:  Procedure Laterality Date  . BLADDER SURGERY  2008  . CATARACT EXTRACTION W/ INTRAOCULAR LENS  IMPLANT, BILATERAL Bilateral   . CHOLECYSTECTOMY    . COLONOSCOPY  09/07/2010   Procedure: COLONOSCOPY;  Surgeon: Rogene Houston, MD;  Location: [facility];  Service: Endoscopy;  Laterality: N/A;  . CORONARY ANGIOPLASTY    . INGUINAL HERNIA REPAIR  2005  . TOTAL HIP ARTHROPLASTY Left 12/23/2018   Procedure: TOTAL HIP ARTHROPLASTY ANTERIOR APPROACH;  Surgeon: Renette Butters, MD;  Location: [facility] ORS;  Service: Orthopedics;  Laterality: Left;  . TOTAL HIP ARTHROPLASTY Left 05/12/2019   Procedure: irrigation and debridement septic hip headliner exchange;  Surgeon: Renette Butters, MD;  Location: [facility] OR[facility];  Service: Orthopedics;  Laterality: Left;  wants hana table    Family History  Problem Relation Age of Onset  . CAD Mother   . CAD Father     Social History:  reports that he quit smoking about 28 years ago. He has never used smokeless tobacco. He reports current alcohol use. He reports that he does not use drugs.  Allergies:  Allergies  Allergen Reactions  . Sulfa Antibiotics     unknown    Medications:  I have reviewed the patient's current medications. Scheduled: . sodium chloride   Intravenous Once  . Chlorhexidine Gluconate Cloth  6 each Topical Daily  . cholecalciferol  5,000 Units Oral Daily  . feeding supplement (ENSURE ENLIVE)  237 mL Oral BID BM  . fenofibrate  160 mg Oral Daily  . finasteride  5 mg Oral Daily  . furosemide  20 mg Intravenous Once  . insulin aspart  0-9 Units Subcutaneous TID WC  . levothyroxine  25 mcg Oral QAC breakfast  . lisinopril  2.5 mg Oral Daily  .  metoprolol succinate  25 mg Oral Daily  . multivitamin with minerals  1 tablet Oral Daily  . multivitamins with iron  1 tablet Oral Daily  . omega-3 acid ethyl esters  1 capsule Oral Daily  . sodium bicarbonate  650 mg Oral TID  . sodium chloride flush  10-40 mL Intracatheter Q12H  . tamsulosin  0.4 mg Oral QPC supper    Results for orders placed or performed during the hospital encounter of 05/28/19 (from the past 24 hour(s))  Glucose, capillary     Status: Abnormal   Collection Time: 05/30/19 11:16 AM  Result Value Ref Range   Glucose-Capillary 142 (H) 70 - 99 mg/dL  Hemoglobin and hematocrit, blood     Status: Abnormal   Collection Time: 05/30/19  2:55 PM  Result Value Ref Range   Hemoglobin 7.4 (L) 13.0 - 17.0 g/dL   HCT 24.2 (L) 39.0 - 52.0 %  Glucose, capillary     Status: Abnormal   Collection Time: 05/30/19  5:06 PM  Result Value Ref Range   Glucose-Capillary 210 (H) 70 - 99 mg/dL  Glucose, capillary     Status: Abnormal   Collection Time: 05/30/19 10:37 PM  Result Value Ref Range   Glucose-Capillary 139 (H) 70 - 99 mg/dL  Basic metabolic panel     Status: Abnormal   Collection Time: 05/31/19  5:27 AM  Result Value Ref Range   Sodium 133 (L) 135 - 145 mmol/L   Potassium 3.5 3.5 - 5.1 mmol/L   Chloride 107 98 - 111 mmol/L   CO2 23 22 - 32 mmol/L   Glucose, Bld 143 (H) 70 - 99 mg/dL   BUN 19 8 - 23 mg/dL   Creatinine, Ser 1.21 0.61 - 1.24 mg/dL   Calcium 8.7 (L) 8.9 - 10.3 mg/dL   GFR calc non Af Amer 54 (L) >60 mL/min   GFR calc Af Amer >60 >60 mL/min   Anion gap 3 (L) 5 - 15  CBC     Status: Abnormal   Collection Time: 05/31/19  5:27 AM  Result Value Ref Range   WBC 4.7 4.0 - 10.5 K/uL   RBC 2.60 (L) 4.22 - 5.81 MIL/uL   Hemoglobin 6.9 (LL) 13.0 - 17.0 g/dL   HCT 22.7 (L) 39.0 - 52.0 %   MCV 87.3 80.0 - 100.0 fL   MCH 26.5 26.0 - 34.0 pg   MCHC 30.4 30.0 - 36.0 g/dL   RDW 15.7 (H) 11.5 - 15.5 %   Platelets 282 150 - 400 K/uL   nRBC 0.0 0.0 - 0.2 %   Magnesium     Status: None   Collection Time: 05/31/19  5:27 AM  Result Value Ref Range   Magnesium 2.0 1.7 - 2.4 mg/dL  Type and screen   [facility]     Status: None (Preliminary result)   Collection Time: 05/31/19  7:22 AM  Result Value Ref Range   ABO/RH(D) PENDING    Antibody Screen PENDING    Sample Expiration      06/03/2019,2359 Performed at [facility], [place] [address], [place], [place] [address]   Glucose, capillary     Status: Abnormal   Collection Time: 05/31/19  7:43 AM  Result Value Ref Range   Glucose-Capillary 136 (H) 70 - 99 mg/dL  Prepare RBC (crossmatch)     Status: None   Collection Time: 05/31/19  8:15 AM  Result Value Ref Range   Order Confirmation      ORDER PROCESSED BY BLOOD BANK Performed at [facility] [address] [place], [place] [address]    Left hip aspiration: 05/29/2019 20k WBC with 91% neutrophils No growth as of yet  Left hip aspiration: 05/09/2019 274,000 white blood cells with 91% neutrophils Culture results from that time indicated group B streptococcus.  X-ray: CLINICAL DATA:  Postoperative left hip infection.  EXAM: DG HIP (WITH OR WITHOUT PELVIS) 2-3V LEFT  COMPARISON:  Fluoroscopic images of May 12, 2019.  FINDINGS: The left femoral and acetabular components appear to be well situated. No fracture or dislocation is noted. Gas is noted in the surrounding soft tissues concerning for postoperative change or possibly infection.  IMPRESSION: Status post left total hip arthroplasty. Gas is noted in surrounding soft tissues which may represent postoperative change, but infection cannot be excluded.   Electronically Signed   By: Marijo Conception M.D.  CLINICAL DATA:  Hip replacement, question of infection, high fevers  EXAM: CT OF THE LEFT HIP WITHOUT CONTRAST  TECHNIQUE: Multidetector CT imaging of the left hip was performed according to the standard  protocol. Multiplanar CT image reconstructions were also generated.  COMPARISON:  May 28, 2019 radiograph, CT May 08, 2019  FINDINGS: Bones/Joint/Cartilage  The patient is status post left total hip arthroplasty. No periprosthetic lucency or fracture is seen. No hip joint effusion is noted. The femoral head component is well seated within the acetabular component.  Ligaments  Suboptimally assessed by CT.  Muscles and Tendons  Head are within or adjacent to the tensor fascia lata there is a multilocular fluid collection containing foci of air measuring approximately 4.7 x 5.3 by 9.7 cm extending along the anterolateral leg. There is mild fatty atrophy of the remainder of the muscles surrounding the hip. Limited visualization of the tendons is seen. The previously seen multilocular collection along the posterior hip is no longer identified.  Soft tissues  There appears to be a hyperdense material layering in the posterior bladder which could represent layering blood products. The bladder appears to be markedly distended. Surgical suture seen along the lower left anterior inguinal wall. There is subcutaneous edema along the lateral aspect of the hip.  IMPRESSION: 1. There remains a multilocular complex air and fluid collection within the anterior hip measuring approximately 4.7 x 5.3 x 9.7 cm extending along the anterior thigh, concerning for multilocular abscess. 2. Interval resolution of the posterior multilocular collection seen on prior CT of 4 9 2021. 3. Hyperdense probable blood products within the posterior bladder with a markedly distended bladder.   Electronically Signed   By: Prudencio Pair M.D.  ROS  As identified in this current history and physical.  His past history since total hip arthroplasty in November 2020 including his stroke in February 20,021 and  this recent history for infection well-documented.  Blood pressure (!) 132/59, pulse (!)  55, temperature 98.7 F (37.1 C), temperature source Oral, resp. rate 18, height 5\' 6"  (1.676 m), weight 74.1 kg, SpO2 97 %.  Physical Exam: Very pleasant 86 year old male awake alert and oriented this morning.  He is in no acute distress.  General: Alert and awake, oriented x3, not in any acute distress. HEENT: anicteric sclera, EOMI CVS regular rate, normal  Chest: , no wheezing, no respiratory distress Abdomen: soft non-distended,   Neuro: nonfocal  Left hip exam: Left hip incision healed from most recent surgery Pain with movement of the left hip both active and passive Otherwise neurovascular intact  Assessment/Plan: Assessment: 1.  Persistent infection left total hip arthroplasty despite recent excisional nonexcisional debridement with modular component revision 2.  Anemia with hemoglobin less than 7 today  Plan: 1.  I feel it is in his best interest though unfortunate that his left total hip replacement should be resected with plans for a two-stage treatment of this infected left total hip.  I reviewed with him this tentative plan of removal implant with placement of an antibiotic spacer that may allow for some weightbearing with concurrent treatment of 6 weeks IV antibiotics beginning from the time of that surgery.  Following that then there would be a holiday away from antibiotics to make certain there was no recurrence of infection prior to proceeding.  A lot of this would be done following lab work including sedimentation rate and C-reactive protein. At this point I am trying to plan to take him to the operating room Monday afternoon to perform this resection.  See below.  I did order serum sedimentation rate and C-reactive protein to be performed the morning of 06/01/2019.  2.  He needs to have his anemia treated as any surgical invention this point will result in blood loss.  At this point I will plan to try to take him to the operating room on Monday afternoon.  He is to  get 2 units of packed red blood cells today.  He may require 2 more units prior to surgery and may require it perioperatively.  We will use tranexamic acid to try to minimize perioperative blood loss.   Mauri Pole 05/31/2019, 8:46 AM

## 2019-06-01 ENCOUNTER — Inpatient Hospital Stay (HOSPITAL_COMMUNITY): Payer: Medicare Other | Admitting: Certified Registered Nurse Anesthetist

## 2019-06-01 ENCOUNTER — Encounter (HOSPITAL_COMMUNITY): Payer: Self-pay | Admitting: Internal Medicine

## 2019-06-01 ENCOUNTER — Encounter (HOSPITAL_COMMUNITY)
Admission: EM | Disposition: A | Discharge status: Skilled Nursing Facility | Payer: Self-pay | Source: Home / Self Care | Attending: Internal Medicine

## 2019-06-01 ENCOUNTER — Inpatient Hospital Stay (HOSPITAL_COMMUNITY): Payer: Medicare Other

## 2019-06-01 DIAGNOSIS — T8452XA Infection and inflammatory reaction due to internal left hip prosthesis, initial encounter: Secondary | ICD-10-CM | POA: Diagnosis not present

## 2019-06-01 DIAGNOSIS — M009 Pyogenic arthritis, unspecified: Secondary | ICD-10-CM | POA: Diagnosis not present

## 2019-06-01 HISTORY — PX: REIMPLANTATION OF CEMENT SPACER HIP: SHX6049

## 2019-06-01 HISTORY — PX: TOTAL HIP ARTHROPLASTY WITH HARDWARE REMOVAL: SHX6438

## 2019-06-01 LAB — POCT I-STAT, CHEM 8
BUN: 16 mg/dL (ref 8–23)
BUN: 17 mg/dL (ref 8–23)
Calcium, Ion: 1.39 mmol/L (ref 1.15–1.40)
Calcium, Ion: 1.4 mmol/L (ref 1.15–1.40)
Chloride: 104 mmol/L (ref 98–111)
Chloride: 102 mmol/L (ref 98–111)
Creatinine, Ser: 1 mg/dL (ref 0.61–1.24)
Creatinine, Ser: 1.1 mg/dL (ref 0.61–1.24)
Glucose, Bld: 212 mg/dL — ABNORMAL HIGH (ref 70–99)
Glucose, Bld: 231 mg/dL — ABNORMAL HIGH (ref 70–99)
HCT: 32 % — ABNORMAL LOW (ref 39.0–52.0)
HCT: 31 % — ABNORMAL LOW (ref 39.0–52.0)
Hemoglobin: 10.9 g/dL — ABNORMAL LOW (ref 13.0–17.0)
Hemoglobin: 10.5 g/dL — ABNORMAL LOW (ref 13.0–17.0)
Potassium: 4.6 mmol/L (ref 3.5–5.1)
Potassium: 4.9 mmol/L (ref 3.5–5.1)
Sodium: 133 mmol/L — ABNORMAL LOW (ref 135–145)
Sodium: 133 mmol/L — ABNORMAL LOW (ref 135–145)
TCO2: 24 mmol/L (ref 22–32)
TCO2: 23 mmol/L (ref 22–32)

## 2019-06-01 LAB — CBC
HCT: 28 % — ABNORMAL LOW (ref 39.0–52.0)
Hemoglobin: 8.9 g/dL — ABNORMAL LOW (ref 13.0–17.0)
MCH: 27.7 pg (ref 26.0–34.0)
MCHC: 31.8 g/dL (ref 30.0–36.0)
MCV: 87.2 fL (ref 80.0–100.0)
Platelets: 283 10*3/uL (ref 150–400)
RBC: 3.21 MIL/uL — ABNORMAL LOW (ref 4.22–5.81)
RDW: 15.4 % (ref 11.5–15.5)
WBC: 6.1 10*3/uL (ref 4.0–10.5)
nRBC: 0 % (ref 0.0–0.2)

## 2019-06-01 LAB — BASIC METABOLIC PANEL
Anion gap: 8 (ref 5–15)
BUN: 19 mg/dL (ref 8–23)
CO2: 22 mmol/L (ref 22–32)
Calcium: 9.1 mg/dL (ref 8.9–10.3)
Chloride: 104 mmol/L (ref 98–111)
Creatinine, Ser: 1.19 mg/dL (ref 0.61–1.24)
GFR calc Af Amer: >60 mL/min (ref >60)
GFR calc non Af Amer: 55 mL/min — ABNORMAL LOW (ref >60)
Glucose, Bld: 131 mg/dL — ABNORMAL HIGH (ref 70–99)
Potassium: 4 mmol/L (ref 3.5–5.1)
Sodium: 134 mmol/L — ABNORMAL LOW (ref 135–145)

## 2019-06-01 LAB — GLUCOSE, CAPILLARY
Glucose-Capillary: 136 mg/dL — ABNORMAL HIGH (ref 70–99)
Glucose-Capillary: 146 mg/dL — ABNORMAL HIGH (ref 70–99)
Glucose-Capillary: 183 mg/dL — ABNORMAL HIGH (ref 70–99)
Glucose-Capillary: 229 mg/dL — ABNORMAL HIGH (ref 70–99)
Glucose-Capillary: 246 mg/dL — ABNORMAL HIGH (ref 70–99)

## 2019-06-01 LAB — C-REACTIVE PROTEIN: CRP: 10.8 mg/dL — ABNORMAL HIGH (ref <1.0)

## 2019-06-01 LAB — HEPARIN LEVEL (UNFRACTIONATED): Heparin Unfractionated: <0.1 IU/mL — ABNORMAL LOW (ref 0.30–0.70)

## 2019-06-01 LAB — PREPARE RBC (CROSSMATCH)

## 2019-06-01 LAB — MRSA PCR SCREENING: MRSA by PCR: NEGATIVE

## 2019-06-01 LAB — SEDIMENTATION RATE: Sed Rate: 55 mm/hr — ABNORMAL HIGH (ref 0–16)

## 2019-06-01 SURGERY — REVISION, ARTHROPLASTY, HIP
Anesthesia: General | Site: Hip | Laterality: Left

## 2019-06-01 MED ORDER — METHOCARBAMOL 500 MG IVPB - SIMPLE MED
500.0000 mg | Freq: Four times a day (QID) | INTRAVENOUS | Status: DC | PRN
  Filled 2019-06-01: qty 50

## 2019-06-01 MED ORDER — ACETAMINOPHEN 500 MG PO TABS
500.0000 mg | ORAL_TABLET | Freq: Four times a day (QID) | ORAL | Status: AC
  Administered 2019-06-02 (×3): 500 mg via ORAL
  Filled 2019-06-01 (×3): qty 1

## 2019-06-01 MED ORDER — TRANEXAMIC ACID-NACL 1000-0.7 MG/100ML-% IV SOLN
INTRAVENOUS | Status: AC
  Filled 2019-06-01: qty 100

## 2019-06-01 MED ORDER — FERROUS SULFATE 325 (65 FE) MG PO TABS
325.0000 mg | ORAL_TABLET | Freq: Three times a day (TID) | ORAL | Status: DC
  Administered 2019-06-02 – 2019-06-04 (×8): 325 mg via ORAL
  Filled 2019-06-01 (×8): qty 1

## 2019-06-01 MED ORDER — PROPOFOL 10 MG/ML IV BOLUS
INTRAVENOUS | Status: DC | PRN
  Administered 2019-06-01: 80 mg via INTRAVENOUS

## 2019-06-01 MED ORDER — MUSCLE RUB 10-15 % EX CREA
1.0000 "application " | TOPICAL_CREAM | CUTANEOUS | Status: DC | PRN
  Administered 2019-06-01: 1 via TOPICAL
  Filled 2019-06-01: qty 85

## 2019-06-01 MED ORDER — HEPARIN (PORCINE) 25000 UT/250ML-% IV SOLN: 1400.0000 [IU]/h | INTRAVENOUS | Status: DC

## 2019-06-01 MED ORDER — SODIUM CHLORIDE 0.9 % IV SOLN: INTRAVENOUS | Status: DC

## 2019-06-01 MED ORDER — VANCOMYCIN HCL 1000 MG IV SOLR
INTRAVENOUS | Status: DC | PRN
  Administered 2019-06-01: 5000 g

## 2019-06-01 MED ORDER — POLYETHYLENE GLYCOL 3350 17 G PO PACK
17.0000 g | PACK | Freq: Two times a day (BID) | ORAL | Status: DC
  Administered 2019-06-03: 17 g via ORAL
  Filled 2019-06-01 (×6): qty 1

## 2019-06-01 MED ORDER — ROCURONIUM BROMIDE 10 MG/ML (PF) SYRINGE
PREFILLED_SYRINGE | INTRAVENOUS | Status: AC
  Filled 2019-06-01: qty 10

## 2019-06-01 MED ORDER — ONDANSETRON HCL 4 MG/2ML IJ SOLN
INTRAMUSCULAR | Status: AC
  Filled 2019-06-01: qty 2

## 2019-06-01 MED ORDER — FENTANYL CITRATE (PF) 250 MCG/5ML IJ SOLN
INTRAMUSCULAR | Status: DC | PRN
  Administered 2019-06-01 (×6): 50 ug via INTRAVENOUS

## 2019-06-01 MED ORDER — ONDANSETRON HCL 4 MG PO TABS: 4.0000 mg | ORAL_TABLET | Freq: Four times a day (QID) | ORAL | Status: DC | PRN

## 2019-06-01 MED ORDER — PROPOFOL 10 MG/ML IV BOLUS
INTRAVENOUS | Status: AC
  Filled 2019-06-01: qty 20

## 2019-06-01 MED ORDER — PHENYLEPHRINE HCL-NACL 10-0.9 MG/250ML-% IV SOLN
INTRAVENOUS | Status: DC | PRN
  Administered 2019-06-01: 80 ug/min via INTRAVENOUS

## 2019-06-01 MED ORDER — HYDROCODONE-ACETAMINOPHEN 5-325 MG PO TABS
1.0000 | ORAL_TABLET | ORAL | Status: DC | PRN
  Administered 2019-06-01 – 2019-06-02 (×2): 2 via ORAL
  Filled 2019-06-01 (×3): qty 2

## 2019-06-01 MED ORDER — MAGNESIUM CITRATE PO SOLN: 1.0000 | Freq: Once | ORAL | Status: DC | PRN

## 2019-06-01 MED ORDER — IRRISEPT - 450ML BOTTLE WITH 0.05% CHG IN STERILE WATER, USP 99.95% OPTIME
TOPICAL | Status: DC | PRN
  Administered 2019-06-01: 18:00:00 450 mL

## 2019-06-01 MED ORDER — TOBRAMYCIN SULFATE 1.2 G IJ SOLR
INTRAMUSCULAR | Status: DC | PRN
  Administered 2019-06-01: 6 g

## 2019-06-01 MED ORDER — ALBUMIN HUMAN 5 % IV SOLN: INTRAVENOUS | Status: DC | PRN

## 2019-06-01 MED ORDER — PREDNISONE 20 MG PO TABS
40.0000 mg | ORAL_TABLET | Freq: Every day | ORAL | Status: DC
  Administered 2019-06-02 – 2019-06-04 (×3): 40 mg via ORAL
  Filled 2019-06-01 (×3): qty 2

## 2019-06-01 MED ORDER — CEFAZOLIN SODIUM-DEXTROSE 2-4 GM/100ML-% IV SOLN
INTRAVENOUS | Status: AC
  Filled 2019-06-01: qty 100

## 2019-06-01 MED ORDER — HYDROMORPHONE HCL 1 MG/ML IJ SOLN
0.2500 mg | INTRAMUSCULAR | Status: DC | PRN
  Administered 2019-06-01 (×4): 0.5 mg via INTRAVENOUS

## 2019-06-01 MED ORDER — LIDOCAINE 2% (20 MG/ML) 5 ML SYRINGE
INTRAMUSCULAR | Status: AC
  Filled 2019-06-01: qty 5

## 2019-06-01 MED ORDER — BISACODYL 10 MG RE SUPP: 10.0000 mg | Freq: Every day | RECTAL | Status: DC | PRN

## 2019-06-01 MED ORDER — HYDROCODONE-ACETAMINOPHEN 7.5-325 MG PO TABS: 1.0000 | ORAL_TABLET | ORAL | Status: DC | PRN

## 2019-06-01 MED ORDER — STERILE WATER FOR IRRIGATION IR SOLN
Status: DC | PRN
  Administered 2019-06-01 (×2): 1000 mL

## 2019-06-01 MED ORDER — MORPHINE SULFATE (PF) 2 MG/ML IV SOLN
0.5000 mg | INTRAVENOUS | Status: DC | PRN
  Administered 2019-06-01: 0.5 mg via INTRAVENOUS
  Filled 2019-06-01: qty 1

## 2019-06-01 MED ORDER — METHOCARBAMOL 500 MG PO TABS: 500.0000 mg | ORAL_TABLET | Freq: Four times a day (QID) | ORAL | Status: DC | PRN

## 2019-06-01 MED ORDER — METOCLOPRAMIDE HCL 5 MG/ML IJ SOLN: 5.0000 mg | Freq: Three times a day (TID) | INTRAMUSCULAR | Status: DC | PRN

## 2019-06-01 MED ORDER — DIPHENHYDRAMINE HCL 12.5 MG/5ML PO ELIX: 12.5000 mg | ORAL_SOLUTION | ORAL | Status: DC | PRN

## 2019-06-01 MED ORDER — FENTANYL CITRATE (PF) 100 MCG/2ML IJ SOLN
INTRAMUSCULAR | Status: AC
  Filled 2019-06-01: qty 2

## 2019-06-01 MED ORDER — DEXAMETHASONE SODIUM PHOSPHATE 10 MG/ML IJ SOLN
INTRAMUSCULAR | Status: AC
  Filled 2019-06-01: qty 1

## 2019-06-01 MED ORDER — TOBRAMYCIN SULFATE 1.2 G IJ SOLR
INTRAMUSCULAR | Status: AC
  Filled 2019-06-01: qty 6

## 2019-06-01 MED ORDER — LACTATED RINGERS IV SOLN: INTRAVENOUS | Status: DC | PRN

## 2019-06-01 MED ORDER — LIDOCAINE 2% (20 MG/ML) 5 ML SYRINGE
INTRAMUSCULAR | Status: DC | PRN
  Administered 2019-06-01: 80 mg via INTRAVENOUS

## 2019-06-01 MED ORDER — ONDANSETRON HCL 4 MG/2ML IJ SOLN
INTRAMUSCULAR | Status: DC | PRN
  Administered 2019-06-01: 4 mg via INTRAVENOUS

## 2019-06-01 MED ORDER — PHENYLEPHRINE HCL (PRESSORS) 10 MG/ML IV SOLN
INTRAVENOUS | Status: AC
  Filled 2019-06-01: qty 1

## 2019-06-01 MED ORDER — ALUM & MAG HYDROXIDE-SIMETH 200-200-20 MG/5ML PO SUSP: 15.0000 mL | ORAL | Status: DC | PRN

## 2019-06-01 MED ORDER — TRANEXAMIC ACID-NACL 1000-0.7 MG/100ML-% IV SOLN
1000.0000 mg | Freq: Once | INTRAVENOUS | Status: AC
  Administered 2019-06-01: 1000 mg via INTRAVENOUS
  Filled 2019-06-01: qty 100

## 2019-06-01 MED ORDER — HYDROMORPHONE HCL 1 MG/ML IJ SOLN
INTRAMUSCULAR | Status: AC
  Filled 2019-06-01: qty 2

## 2019-06-01 MED ORDER — SODIUM CHLORIDE 0.9 % IR SOLN
Status: DC | PRN
  Administered 2019-06-01: 1000 mL

## 2019-06-01 MED ORDER — DEXAMETHASONE SODIUM PHOSPHATE 10 MG/ML IJ SOLN
INTRAMUSCULAR | Status: DC | PRN
  Administered 2019-06-01: 4 mg via INTRAVENOUS

## 2019-06-01 MED ORDER — PREDNISONE 20 MG PO TABS
60.0000 mg | ORAL_TABLET | Freq: Once | ORAL | Status: AC
  Administered 2019-06-01: 60 mg via ORAL
  Filled 2019-06-01: qty 3

## 2019-06-01 MED ORDER — TRANEXAMIC ACID-NACL 1000-0.7 MG/100ML-% IV SOLN
1000.0000 mg | INTRAVENOUS | Status: AC
  Administered 2019-06-01: 1000 mg via INTRAVENOUS
  Filled 2019-06-01: qty 100

## 2019-06-01 MED ORDER — METOCLOPRAMIDE HCL 5 MG PO TABS: 5.0000 mg | ORAL_TABLET | Freq: Three times a day (TID) | ORAL | Status: DC | PRN

## 2019-06-01 MED ORDER — CEFAZOLIN SODIUM-DEXTROSE 2-4 GM/100ML-% IV SOLN: 2.0000 g | INTRAVENOUS | Status: DC

## 2019-06-01 MED ORDER — POVIDONE-IODINE 10 % EX SWAB
2.0000 "application " | Freq: Once | CUTANEOUS | Status: AC
  Administered 2019-06-01: 2 via TOPICAL

## 2019-06-01 MED ORDER — SODIUM CHLORIDE 0.9 % IV SOLN
10.0000 mL/h | Freq: Once | INTRAVENOUS | Status: AC
  Administered 2019-06-01: 10 mL/h via INTRAVENOUS

## 2019-06-01 MED ORDER — VANCOMYCIN HCL 1000 MG IV SOLR
INTRAVENOUS | Status: AC
  Filled 2019-06-01: qty 5000

## 2019-06-01 MED ORDER — CHLORHEXIDINE GLUCONATE 4 % EX LIQD
60.0000 mL | Freq: Once | CUTANEOUS | Status: AC
  Administered 2019-06-01: 4 via TOPICAL
  Filled 2019-06-01: qty 60

## 2019-06-01 MED ORDER — INSULIN ASPART 100 UNIT/ML ~~LOC~~ SOLN
SUBCUTANEOUS | Status: AC
  Filled 2019-06-01: qty 1

## 2019-06-01 MED ORDER — DOCUSATE SODIUM 100 MG PO CAPS
100.0000 mg | ORAL_CAPSULE | Freq: Two times a day (BID) | ORAL | Status: DC
  Administered 2019-06-01 – 2019-06-03 (×3): 100 mg via ORAL
  Filled 2019-06-01 (×6): qty 1

## 2019-06-01 MED ORDER — PHENOL 1.4 % MT LIQD
1.0000 | OROMUCOSAL | Status: DC | PRN
  Filled 2019-06-01: qty 177

## 2019-06-01 MED ORDER — ACETAMINOPHEN 325 MG PO TABS
325.0000 mg | ORAL_TABLET | Freq: Four times a day (QID) | ORAL | Status: DC | PRN
  Administered 2019-06-03 – 2019-06-04 (×3): 650 mg via ORAL
  Filled 2019-06-01 (×2): qty 2

## 2019-06-01 MED ORDER — VANCOMYCIN HCL 1000 MG IV SOLR
INTRAVENOUS | Status: AC
  Filled 2019-06-01: qty 3000

## 2019-06-01 MED ORDER — SODIUM CHLORIDE 0.9 % IR SOLN
Status: DC | PRN
  Administered 2019-06-01 (×2): 3000 mL

## 2019-06-01 MED ORDER — MORPHINE SULFATE (PF) 4 MG/ML IV SOLN: 0.5000 mg | INTRAVENOUS | Status: DC | PRN

## 2019-06-01 MED ORDER — MENTHOL 3 MG MT LOZG: 1.0000 | LOZENGE | OROMUCOSAL | Status: DC | PRN

## 2019-06-01 MED ORDER — ROCURONIUM BROMIDE 10 MG/ML (PF) SYRINGE
PREFILLED_SYRINGE | INTRAVENOUS | Status: DC | PRN
  Administered 2019-06-01: 60 mg via INTRAVENOUS
  Administered 2019-06-01: 20 mg via INTRAVENOUS

## 2019-06-01 MED ORDER — CEFAZOLIN SODIUM-DEXTROSE 2-4 GM/100ML-% IV SOLN
2.0000 g | INTRAVENOUS | Status: AC
  Administered 2019-06-01: 2 g via INTRAVENOUS
  Filled 2019-06-01: qty 100

## 2019-06-01 MED ORDER — ONDANSETRON HCL 4 MG/2ML IJ SOLN: 4.0000 mg | Freq: Four times a day (QID) | INTRAMUSCULAR | Status: DC | PRN

## 2019-06-01 SURGICAL SUPPLY — 52 items
BAG ZIPLOCK 12X15 (MISCELLANEOUS) ×3 IMPLANT
BONE CEMENT GENTAMICIN (Cement) ×12 IMPLANT
BOWL SMART MIX CTS (DISPOSABLE) ×3 IMPLANT
BRUSH FEMORAL CANAL (MISCELLANEOUS) ×2 IMPLANT
CEMENT BONE GENTAMICIN 40 (Cement) ×2 IMPLANT
COVER SURGICAL LIGHT HANDLE (MISCELLANEOUS) ×3 IMPLANT
COVER WAND RF STERILE (DRAPES) IMPLANT
DRAPE ORTHO SPLIT 77X108 STRL (DRAPES) ×6
DRAPE POUCH INSTRU U-SHP 10X18 (DRAPES) ×3 IMPLANT
DRAPE SURG 17X11 SM STRL (DRAPES) ×3 IMPLANT
DRAPE SURG ORHT 6 SPLT 77X108 (DRAPES) ×2 IMPLANT
DRAPE U-SHAPE 47X51 STRL (DRAPES) ×3 IMPLANT
DRESSING AQUACEL AG SP 3.5X10 (GAUZE/BANDAGES/DRESSINGS) IMPLANT
DRSG AQUACEL AG ADV 3.5X10 (GAUZE/BANDAGES/DRESSINGS) ×3 IMPLANT
DRSG AQUACEL AG SP 3.5X10 (GAUZE/BANDAGES/DRESSINGS) ×3
DURAPREP 26ML APPLICATOR (WOUND CARE) ×3 IMPLANT
ELECT BLADE TIP CTD 4 INCH (ELECTRODE) ×3 IMPLANT
ELECT REM PT RETURN 15FT ADLT (MISCELLANEOUS) ×3 IMPLANT
EVACUATOR 1/8 PVC DRAIN (DRAIN) ×3 IMPLANT
FACESHIELD WRAPAROUND (MASK) ×12 IMPLANT
FACESHIELD WRAPAROUND OR TEAM (MASK) ×4 IMPLANT
GLOVE BIOGEL PI IND STRL 7.5 (GLOVE) ×1 IMPLANT
GLOVE BIOGEL PI IND STRL 8.5 (GLOVE) ×1 IMPLANT
GLOVE BIOGEL PI INDICATOR 7.5 (GLOVE) ×2
GLOVE BIOGEL PI INDICATOR 8.5 (GLOVE) ×2
GLOVE ECLIPSE 8.0 STRL XLNG CF (GLOVE) ×3 IMPLANT
GLOVE ORTHO TXT STRL SZ7.5 (GLOVE) ×6 IMPLANT
GOWN STRL REUS W/TWL 2XL LVL3 (GOWN DISPOSABLE) ×3 IMPLANT
GOWN STRL REUS W/TWL LRG LVL3 (GOWN DISPOSABLE) ×3 IMPLANT
HANDPIECE INTERPULSE COAX TIP (DISPOSABLE)
HEAD FEM STD 32X+1 STRL (Hips) ×2 IMPLANT
KIT BASIN (CUSTOM PROCEDURE TRAY) ×3 IMPLANT
KIT TURNOVER KIT A (KITS) IMPLANT
LINER ACET CUP 42MMX32MM (Hips) ×2 IMPLANT
MANIFOLD NEPTUNE II (INSTRUMENTS) ×3 IMPLANT
NS IRRIG 1000ML POUR BTL (IV SOLUTION) ×6 IMPLANT
PACK TOTAL JOINT (CUSTOM PROCEDURE TRAY) ×3 IMPLANT
PENCIL SMOKE EVACUATOR (MISCELLANEOUS) IMPLANT
PROTECTOR NERVE ULNAR (MISCELLANEOUS) ×3 IMPLANT
SET HNDPC FAN SPRY TIP SCT (DISPOSABLE) IMPLANT
STEM CEMENTED SUMMIT SZ2 (Stem) ×2 IMPLANT
SUT STRATAFIX 0 PDS 27 VIOLET (SUTURE) ×3
SUT VIC AB 1 CT1 36 (SUTURE) ×9 IMPLANT
SUT VIC AB 2-0 CT1 27 (SUTURE) ×9
SUT VIC AB 2-0 CT1 TAPERPNT 27 (SUTURE) ×3 IMPLANT
SUTURE STRATFX 0 PDS 27 VIOLET (SUTURE) ×1 IMPLANT
TOWEL OR 17X26 10 PK STRL BLUE (TOWEL DISPOSABLE) ×6 IMPLANT
TOWEL OR NON WOVEN STRL DISP B (DISPOSABLE) ×3 IMPLANT
TOWER CARTRIDGE SMART MIX (DISPOSABLE) IMPLANT
TRAY FOLEY MTR SLVR 16FR STAT (SET/KITS/TRAYS/PACK) ×3 IMPLANT
WATER STERILE IRR 1000ML POUR (IV SOLUTION) ×3 IMPLANT
YANKAUER SUCT BULB TIP 10FT TU (MISCELLANEOUS) ×3 IMPLANT

## 2019-06-01 NOTE — Anesthesia Preprocedure Evaluation (Signed)
Anesthesia Evaluation  Patient identified by MRN, date of birth, ID band Patient awake    Reviewed: Allergy & Precautions, NPO status , Patient's Chart, lab work & pertinent test results  Airway Mallampati: II  TM Distance: >3 FB Neck ROM: Full    Dental no notable dental hx.    Pulmonary neg pulmonary ROS, former smoker,    Pulmonary exam normal breath sounds clear to auscultation       Cardiovascular + CAD, + Past MI and + CABG  Normal cardiovascular exam Rhythm:Regular Rate:Normal     Neuro/Psych negative neurological ROS  negative psych ROS   GI/Hepatic negative GI ROS, Neg liver ROS,   Endo/Other  diabetesHypothyroidism   Renal/GU negative Renal ROS  negative genitourinary   Musculoskeletal negative musculoskeletal ROS (+)   Abdominal   Peds negative pediatric ROS (+)  Hematology  (+) anemia ,   Anesthesia Other Findings   Reproductive/Obstetrics negative OB ROS                             Anesthesia Physical Anesthesia Plan  ASA: III  Anesthesia Plan: General   Post-op Pain Management:    Induction: Intravenous  PONV Risk Score and Plan: 2 and Ondansetron, Dexamethasone and Treatment may vary due to age or medical condition  Airway Management Planned: Oral ETT  Additional Equipment: Arterial line  Intra-op Plan:   Post-operative Plan: Possible Post-op intubation/ventilation  Informed Consent: I have reviewed the patients History and Physical, chart, labs and discussed the procedure including the risks, benefits and alternatives for the proposed anesthesia with the patient or authorized representative who has indicated his/her understanding and acceptance.     Dental advisory given  Plan Discussed with: CRNA and Surgeon  Anesthesia Plan Comments:         Anesthesia Quick Evaluation

## 2019-06-01 NOTE — Progress Notes (Addendum)
PROGRESS NOTE    Casey [name]  [id number] DOB: [birth year] DOA: 05/28/2019 PCP: Glenda Chroman, MD   Chief Complaint  Patient presents with  . Hypoglycemia  . Fever    Brief Narrative:  HPI per Dr. Nils Pyle Casey [name] is a 86 y.o. male with medical history significant of diabetes mellitus, hypothyroidism, hypertension, chronic kidney disease stage III, anemia. Patient has a hip surgery with left hip replacement in November 2020.  Patient was found to have multiloculated joint effusion concerning for septic arthritis and was admitted on 05/09/2019 to [facility], was seen in consultation by Dr. Percell Miller and Dr. 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  Patient states that he got his second dose of COVID-19 vaccine last Thursday, moderna vaccine. Patient has been experiencing intermittent fevers with a T-max at home of 102.9 Fahrenheit. His highest temperature here in the ER was 100.9 Fahrenheit. He has been on IV penicillin at home. He states he has been compliant with this. He denies any new hip pain.   Patient did mention that he has been having diarrhea 2-3 episodes, loose stools every day since the last for 5 days.  Also did complain of hypoglycemia with his blood glucose level dropping down to 20s, he stopped his oral medications and it has improved since then.  Patient complains of chills.  Patient denies any chest pain.  Denies any shortness of breath.  He does complain of some coughing with occasional sputum production which is clear.  Denies any abdominal pain, nausea or any vomitings.  Denies any hip pain.  Denies any discharge from the left hip site.  While the patient was in the ER he did have a bowel movement which was loose, not watery, there was some bright red blood in the bedside  commode.  Patient mentions that blood came out from his bowels and also some from his urine.  Patient did mention that he took his Xarelto this morning.   ED Course:  Haywood Pao spoke to Dr. Percell Miller regarding patient status, and also spoke with Dr Baxter Flattery ID.  Urinalysis was checked and no source of any infection was seen, chest x-ray showed no evidence of any pneumonia.  Patient was treated with broad-spectrum antibiotics with vancomycin, cefepime, Flagyl.  Assessment & Plan:   Active Problems:   Sepsis ([place])   Fever   Hematuria, gross   Status post hip surgery  1 sepsis, possibly likely secondary to septic arthritis of the hip POA/recent septic arthritis of the left hip prosthesis status post irrigation and debridement 05/12/2019 per Dr. Percell Miller Patient presenting and meeting criteria for sepsis with a fever with a temp of 102, lactic acid level of 1.9, elevated CRP, tachycardia.  Patient had presented with fever and chills prior to admission.  Initial concern for left hip infection.  CT of the left hip with concern for multiloculated abscess within the anterior hip, interval resolution of posterior multilocular collection seen on prior CT, hyperdense probable blood products within the posterior bladder with markedly distended bladder.  Orthopedics consulted and orthopedics recommended IR aspiration of fluid collection which was done on 05/29/2019 with cultures pending.  Aspirate noted to be turbid with elevated WBCs with neutrophils.  Continue IV vancomycin and IV Rocephin. Orthopedics recommending that left total hip replacement  should be resected with plans for two-stage treatment of his infected left total hip with removal of implant and placement of a antibiotic spacer with concurrent IV antibiotics of 6 weeks starting from time of surgery.  Patient receiving a unit of packed red blood cells this morning perioperatively per orthopedic recommendations.  Orthopedics planning to take patient to the OR  today for surgery.  Per orthopedics. Per ID.  2.  Gross hematuria/urinary retention Patient noted to have a gross hematuria and noted to have some urinary retention overnight.  Foley catheter placed with 1 L of bloody urine immediately coming out per RN.  Anticoagulation on hold.  Case discussed with urology who recommended CT of the abdomen and pelvis which showed moderate hemorrhage within the bladder.  Foley catheter present.  Patient seen by urology who recommended flushing Foley as needed for symptoms/obstruction and recommending continue Foley catheter on discharge per urology with outpatient follow-up.  Due to drop in hemoglobin down to 6.9 urology reassessed the patient and irrigated and aspirated at bedside with return of several small flecks of clot which per urology could easily passed in the catheter and irrigated until urine was light pink and translucent.  Per urology they do not suspect patient has an active ongoing GU bleeding.  Follow H&H.  Transfusion threshold hemoglobin < 7.   3.  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  Urology consulted.  Urology to determine when okay to resume anticoagulation.  Orthopedics assessing to see whether patient may need surgery.  Spoke with urology feel patient does not have any active ongoing GI bleed at this time and okay with starting patient on heparin without a bolus.  Heparin was started however will be held perioperatively.  Orthopedics to recommend when heparin may be resumed postop.  4.  Acute kidney injury on chronic kidney disease stage IIIb, POA Avoid nephrotoxic agents.  Renal function improved with hydration.  Currently at baseline with a creatinine of 1.19.  Saline lock IV fluids.  Follow.  5.  Chronic anemia Likely secondary to chronic kidney disease.  Follow H&H.  6.  BPH Continue Flomax.  Patient now with indwelling  Foley catheter which patient will be discharged home on with outpatient follow-up with urology per urology recommendations..  7.  Hypothyroidism Continue Synthroid.  8.  Diabetes mellitus type 2 Hemoglobin A1c 7.6 (05/10/2019).  CBG of 183 today.  Continue to hold oral hypoglycemic agents.  Sliding scale insulin.   9.  Nonobstructive coronary artery disease ACE inhibitor, beta-blocker, Lovaza.    10.  Diarrhea C. difficile PCR negative.  Continue probiotics.  Imodium as needed.  11.  Acute blood loss anemia Secondary to gross hematuria.  Hemoglobin currently at 8.9 from 6.9.  Status post transfusion 2 units packed red blood cells.  Due to drop in hemoglobin and ongoing gross hematuria urology reassessed patient 05/31/2019, and feel the patient's hematuria was secondary to traumatic catheterization while on anticoagulation.  Urology irrigated and aspirated at bedside with return of several small flecks of clot which per urology could easily passed in the catheter and irrigated until urine was light pink and translucent.  Per urology they do not suspect patient has an active GU bleeding.  Follow H&H.  Transfusion threshold hemoglobin < 7.  12.  Acute gouty arthritis We will give prednisone 60 mg p.o. x1, then prednisone 40 mg daily x4 more days.   DVT prophylaxis: Heparin Code  Status: Full Family Communication: Updated patient and son at bedside.. Disposition:   Status is: Inpatient    Dispo: The patient is from: Home              Anticipated d/c is to: To be determined however likely home with home health.              Anticipated d/c date is: To be determined              Patient currently admitted with probable septic hip, currently on IV antibiotics, also with gross hematuria, was on chronic anticoagulation for LV thrombus however currently on hold secondary to hematuria.  Patient for hip surgery this afternoon.  Orthopedics, ID, urology following.         Consultants:    Orthopedics: Dr. Percell Miller 05/29/2019  ID: Dr. Baxter Flattery 05/29/2019  Urology: Dr. Louis Meckel 05/29/2019  Procedures:   CT abdomen and pelvis 05/29/2019  CT left hip 05/29/2019  Chest x-ray 05/28/2019  Plain films of the left hip 05/28/2019  Transfuse 2 units packed red blood cells 05/31/2019  Transfuse 1 unit packed red blood cells 06/01/2019  Antimicrobials:   IV vancomycin 05/28/2019  IV cefepime 05/28/2019>>>> 05/29/2019  IV Flagyl 05/28/2019>>>> 05/29/2019  IV Rocephin 05/29/2019   Subjective: Patient c/o right knee pain feels secondary to gout. Currently receiving 1 unit PRBCs.  Denies any chest pain.  No shortness of breath.  Dark red blood noted in Foley bag.   Objective: Vitals:   06/01/19 0500 06/01/19 0559 06/01/19 0946 06/01/19 1020  BP:  (!) 146/62 (!) 145/54 (!) 155/58  Pulse:  (!) 53 69 65  Resp:  18    Temp:  98.5 F (36.9 C) 98.5 F (36.9 C) 98.3 F (36.8 C)  TempSrc:  Oral Oral Oral  SpO2:  94% 94% 98%  Weight: 76.2 kg     Height:        Intake/Output Summary (Last 24 hours) at 06/01/2019 1023 Last data filed at 06/01/2019 [id number] Gross per 24 hour  Intake 2075.27 ml  Output 2535 ml  Net -459.73 ml   Filed Weights   05/30/19 0321 05/31/19 0438 06/01/19 0500  Weight: 73.3 kg 74.1 kg 76.2 kg    Examination:  General exam: NAD Respiratory system: CTAB anterior lung fields.  No wheezes, no crackles, no rhonchi.  Normal respiratory effort.  Cardiovascular system: Regular rate rhythm no murmurs rubs or gallops.  No JVD.  No lower extremity edema.  Gastrointestinal system: Abdomen is soft, nontender, nondistended, positive bowel sounds.  No rebound.  No guarding. Central nervous system: Alert and oriented. No focal neurological deficits. Extremities: Right knee warm, tender to palpation.  Symmetric 5 x 5 power. Skin: No rashes, lesions or ulcers Psychiatry: Judgement and insight appear normal. Mood & affect appropriate.     Data Reviewed: I have personally  reviewed following labs and imaging studies  CBC: Recent Labs  Lab 05/28/19 1300 05/28/19 1300 05/29/19 0540 05/29/19 0540 05/30/19 0529 05/30/19 1455 05/31/19 0527 05/31/19 1915 06/01/19 0113  WBC 5.4  --  6.2  --  4.7  --  4.7  --  6.1  NEUTROABS 3.1  --   --   --   --   --   --   --   --   HGB 9.1*   < > 8.0*   < > 7.4* 7.4* 6.9* 9.0* 8.9*  HCT 29.5*   < > 25.8*   < > 23.9* 24.2* 22.7* 28.2* 28.0*  MCV 86.8  --  88.4  --  87.2  --  87.3  --  87.2  PLT 311  --  279  --  292  --  282  --  283   < > = values in this interval not displayed.    Basic Metabolic Panel: Recent Labs  Lab 05/28/19 1300 05/29/19 0540 05/30/19 0529 05/31/19 0527 06/01/19 0113  NA 132* 133* 135 133* 134*  K 4.8 4.2 3.9 3.5 4.0  CL 101 106 107 107 104  CO2 21* 19* 20* 23 22  GLUCOSE 111* 99 140* 143* 131*  BUN 30* 26* 24* 19 19  CREATININE 1.62* 1.49* 1.36* 1.21 1.19  CALCIUM 10.0 9.3 9.0 8.7* 9.1  MG  --   --  1.7 2.0  --     GFR: Estimated Creatinine Clearance: 39.5 mL/min (by C-G formula based on SCr of 1.19 mg/dL).  Liver Function Tests: Recent Labs  Lab 05/28/19 1300  AST 22  ALT 12  ALKPHOS 33*  BILITOT 0.5  PROT 6.3*  ALBUMIN 2.5*    CBG: Recent Labs  Lab 05/31/19 0743 05/31/19 1207 05/31/19 1659 05/31/19 2200 06/01/19 0736  GLUCAP 136* 212* 164* 162* 136*     Recent Results (from the past 240 hour(s))  Respiratory Panel by RT PCR (Flu A&B, Covid) - Nasopharyngeal Swab     Status: None   Collection Time: 05/28/19  1:00 PM   Specimen: Nasopharyngeal Swab  Result Value Ref Range Status   SARS Coronavirus 2 by RT PCR NEGATIVE NEGATIVE Final    Comment: (NOTE) SARS-CoV-2 target nucleic acids are NOT DETECTED. The SARS-CoV-2 RNA is generally detectable in upper respiratoy specimens during the acute phase of infection. The lowest concentration of SARS-CoV-2 viral copies this assay can detect is 131 copies/mL. A negative result does not preclude  SARS-Cov-2 infection and should not be used as the sole basis for treatment or other patient management decisions. A negative result may occur with  improper specimen collection/handling, submission of specimen other than nasopharyngeal swab, presence of viral mutation(s) within the areas targeted by this assay, and inadequate number of viral copies (<131 copies/mL). A negative result must be combined with clinical observations, patient history, and epidemiological information. The expected result is Negative. Fact Sheet for Patients:  PinkCheek.be Fact Sheet for Healthcare Providers:  GravelBags.it This test is not yet ap proved or cleared by the [place] FDA and  has been authorized for detection and/or diagnosis of SARS-CoV-2 by FDA under an Emergency Use Authorization (EUA). This EUA will remain  in effect (meaning this test can be used) for the duration of the COVID-19 declaration under Section 564(b)(1) of the Act, 21 U.S.C. section 360bbb-3(b)(1), unless the authorization is terminated or revoked sooner.    Influenza A by PCR NEGATIVE NEGATIVE Final   Influenza B by PCR NEGATIVE NEGATIVE Final    Comment: (NOTE) The Xpert Xpress SARS-CoV-2/FLU/RSV assay is intended as an aid in  the diagnosis of influenza from Nasopharyngeal swab specimens and  should not be used as a sole basis for treatment. Nasal washings and  aspirates are unacceptable for Xpert Xpress SARS-CoV-2/FLU/RSV  testing. Fact Sheet for Patients: PinkCheek.be Fact Sheet for Healthcare Providers: GravelBags.it This test is not yet approved or cleared by the [place] FDA and  has been authorized for detection and/or diagnosis of SARS-CoV-2 by  FDA under an Emergency Use Authorization (EUA). This EUA will remain  in effect (meaning this test can be used) for  the duration of the  Covid-19  declaration under Section 564(b)(1) of the Act, 21  U.S.C. section 360bbb-3(b)(1), unless the authorization is  terminated or revoked. Performed at [facility], [place] [address] [place], [place] [address]   Blood culture (routine x 2)     Status: None (Preliminary result)   Collection Time: 05/28/19  1:00 PM   Specimen: BLOOD  Result Value Ref Range Status   Specimen Description   Final    BLOOD RIGHT ANTECUBITAL Performed at [place] [address], [place], [place] [address]    Special Requests   Final    BOTTLES DRAWN AEROBIC AND ANAEROBIC Blood Culture results may not be optimal due to an excessive volume of blood received in culture bottles Performed at [place] [address] [place], [place] [address]    Culture   Final    NO GROWTH 4 DAYS Performed at [facility], [place] [address], [place], [place] [address]    Report Status PENDING  Incomplete  Blood culture (routine x 2)     Status: None (Preliminary result)   Collection Time: 05/28/19  1:20 PM   Specimen: BLOOD  Result Value Ref Range Status   Specimen Description   Final    BLOOD LEFT ANTECUBITAL Performed at [place] [address] [place], [place] [address]    Special Requests   Final    BOTTLES DRAWN AEROBIC AND ANAEROBIC Blood Culture adequate volume Performed at [place] [address] [place], [place] [address]    Culture   Final    NO GROWTH 4 DAYS Performed at [facility], [place] [address], [place], [place] [address]    Report Status PENDING  Incomplete  Urine culture     Status: None   Collection Time: 05/28/19  4:27 PM   Specimen: In/Out Cath Urine  Result Value Ref Range Status   Specimen Description   Final    IN/OUT CATH URINE Performed at [place] [address] [place], [place] [address]    Special Requests   Final    NONE Performed at  [facility], [place] [address] [place], [place] [address]    Culture   Final    NO GROWTH Performed at [facility], [place] [address], [place], [place] [address]    Report Status 05/29/2019 FINAL  Final  C Difficile Quick Screen w PCR reflex     Status: None   Collection Time: 05/29/19 12:34 AM   Specimen: STOOL  Result Value Ref Range Status   C Diff antigen NEGATIVE NEGATIVE Final   C Diff toxin NEGATIVE NEGATIVE Final   C Diff interpretation No C. difficile detected.  Final    Comment: Performed at [facility], [place] [address] [place], [place] [address]  Body fluid culture     Status: None (Preliminary result)   Collection Time: 05/29/19  4:29 PM   Specimen: Synovium; Body Fluid  Result Value Ref Range Status   Specimen Description   Final    SYNOVIAL LEFT HIP Performed at [place] [address], [place],  [address]    Special Requests   Final    NONE Performed at [facility], [place] [address], [place], [place] [address]    Gram Stain   Final    ABUNDANT WBC PRESENT,BOTH PMN AND MONONUCLEAR NO ORGANISMS SEEN  Gram Stain Report Called to,Read Back By and Verified With: C.FRANKLIN AT 1736 ON 05/29/19 BY N.Elhadji Pecore Performed at [facility], [place] [address], [place], [place] [address]    Culture   Final    NO GROWTH 3 DAYS Performed at [facility], [place] [address], [place], [place] [address]    Report Status PENDING  Incomplete  Anaerobic culture     Status: None (Preliminary result)   Collection Time: 05/29/19  4:29 PM   Specimen: Synovium; Synovial Fluid  Result Value Ref Range Status   Specimen Description   Final    SYNOVIAL LEFT HIP Performed at [place] [address] [place], [place] [address]    Special Requests   Final    NONE Performed at [facility], [place] [address], [place], [place] [address]    Gram  Stain   Final    ABUNDANT WBC PRESENT,BOTH PMN AND MONONUCLEAR NO ORGANISMS SEEN Performed at [facility], [place] [address] [place], [place] [address]    Culture   Final    NO ANAEROBES ISOLATED; CULTURE IN PROGRESS FOR 5 DAYS   Report Status PENDING  Incomplete  MRSA PCR Screening     Status: None   Collection Time: 06/01/19  5:29 AM   Specimen: Nasal Mucosa; Nasopharyngeal  Result Value Ref Range Status   MRSA by PCR NEGATIVE NEGATIVE Final    Comment:        The GeneXpert MRSA Assay (FDA approved for NASAL specimens only), is one component of a comprehensive MRSA colonization surveillance program. It is not intended to diagnose MRSA infection nor to guide or monitor treatment for MRSA infections. Performed at [facility], [place] [address] [place], [place] [address]          Radiology Studies: No results found.      Scheduled Meds: . Chlorhexidine Gluconate Cloth  6 each Topical Daily  . cholecalciferol  5,000 Units Oral Daily  . feeding supplement (ENSURE ENLIVE)  237 mL Oral BID BM  . fenofibrate  160 mg Oral Daily  . finasteride  5 mg Oral Daily  . insulin aspart  0-9 Units Subcutaneous TID WC  . levothyroxine  25 mcg Oral QAC breakfast  . lisinopril  2.5 mg Oral Daily  . metoprolol succinate  25 mg Oral Daily  . multivitamin with minerals  1 tablet Oral Daily  . multivitamins with iron  1 tablet Oral Daily  . omega-3 acid ethyl esters  1 capsule Oral Daily  . povidone-iodine  2 application Topical Once  . sodium bicarbonate  650 mg Oral TID  . sodium chloride flush  10-40 mL Intracatheter Q12H  . tamsulosin  0.4 mg Oral QPC supper   Continuous Infusions: . sodium chloride Stopped (06/01/19 0938)  .  ceFAZolin (ANCEF) IV    . cefTRIAXone (ROCEPHIN)  IV 2 g (05/31/19 2212)  . tranexamic acid    . vancomycin 1,000 mg (05/31/19 1318)     LOS: 4 days    Time spent: 40 minutes    Irine Seal, MD [facility]   To contact the attending provider between 7A-7P or the covering provider during after hours 7P-7A, please log into the web site [email or web address] and access using universal [place] password for that web site. If you do not have the password, please call the hospital operator.  06/01/2019, 10:23 AM

## 2019-06-01 NOTE — Brief Op Note (Signed)
06/01/2019  7:03 PM  PATIENT:  Casey [name]  86 y.o. male  PRE-OPERATIVE DIAGNOSIS:  infected left total hip replacement  POST-OPERATIVE DIAGNOSIS:  infected left total hip replacement  PROCEDURE:  Procedure(s) with comments: Resection of infected left total hip with placement of cemented articulating antibiotic spacer - Depuy  SURGEON:  Surgeon(s) and Role:    Paralee Cancel, MD - Primary  PHYSICIAN ASSISTANT: Griffith Citron, PA-C  ANESTHESIA:   general  EBL:  800 mL   BLOOD ADMINISTERED:none  DRAINS: none   LOCAL MEDICATIONS USED:  NONE  SPECIMEN:  Source of Specimen:  left hip synovial  DISPOSITION OF SPECIMEN:  PATHOLOGY  COUNTS:  YES  TOURNIQUET:  * No tourniquets in log *  DICTATION: .Other Dictation: Dictation Number [id number]  PLAN OF CARE: Admit to inpatient   PATIENT DISPOSITION:  PACU - hemodynamically stable.   Delay start of Pharmacological VTE agent (>24hrs) due to surgical blood loss or risk of bleeding: no

## 2019-06-01 NOTE — Anesthesia Procedure Notes (Signed)
Procedure Name: Intubation Date/Time: 06/01/2019 4:08 PM Performed by: Anne Fu, CRNA Pre-anesthesia Checklist: Patient identified, Emergency Drugs available, Suction available, Patient being monitored and Timeout performed Patient Re-evaluated:Patient Re-evaluated prior to induction Oxygen Delivery Method: Circle system utilized Preoxygenation: Pre-oxygenation with 100% oxygen Induction Type: IV induction Ventilation: Mask ventilation without difficulty Laryngoscope Size: Mac and 4 Grade View: Grade I Tube type: Oral Tube size: 7.5 mm Number of attempts: 1 Airway Equipment and Method: Stylet Placement Confirmation: ETT inserted through vocal cords under direct vision,  positive ETCO2 and breath sounds checked- equal and bilateral Secured at: 23 cm Tube secured with: Tape Dental Injury: Teeth and Oropharynx as per pre-operative assessment

## 2019-06-01 NOTE — Transfer of Care (Signed)
Immediate Anesthesia Transfer of Care Note  Patient: Casey [name]  Procedure(s) Performed: TOTAL HIP ARTHROPLASTY WITH HARDWARE REMOVAL, A (Left Hip) REIMPLANTATION OF CEMENT SPACER HIP (Left Hip)  Patient Location: PACU  Anesthesia Type:General  Level of Consciousness: awake, alert , oriented and patient cooperative  Airway & Oxygen Therapy: Patient Spontanous Breathing and Patient connected to face mask  Post-op Assessment: Report given to RN and Post -op Vital signs reviewed and stable  Post vital signs: Reviewed and stable  Last Vitals:  Vitals Value Taken Time  BP 161/74 06/01/19 1900  Temp    Pulse 87 06/01/19 1902  Resp 12 06/01/19 1902  SpO2 93 % 06/01/19 1902  Vitals shown include unvalidated device data.  Last Pain:  Vitals:   06/01/19 1419  TempSrc: Oral  PainSc:          Complications: No apparent anesthesia complications

## 2019-06-01 NOTE — Anesthesia Procedure Notes (Signed)
Arterial Line Insertion Start/End5/03/2019 2:55 PM, 06/01/2019 3:00 PM Performed by: Anne Fu, CRNA, CRNA  Patient location: Pre-op. Lidocaine 1% used for infiltration and patient sedated radial was placed Catheter size: 20 G Hand hygiene performed  and maximum sterile barriers used  Allen's test indicative of satisfactory collateral circulation Attempts: 1 Procedure performed without using ultrasound guided technique. Following insertion, dressing applied and Biopatch. Post procedure assessment: normal  Patient tolerated the procedure well with no immediate complications. Additional procedure comments: Placed pre-op X 1 attempt with noted blood return catheter placed with ease and noted blood return post placement.  Marland Kitchen

## 2019-06-01 NOTE — Progress Notes (Addendum)
ANTICOAGULATION CONSULT NOTE -  Consult  Pharmacy Consult for IV heparin (note on Xarelto 10mg  daily PTA) Indication: LV thrombus  Allergies  Allergen Reactions  . Sulfa Antibiotics     unknown    Patient Measurements: Height: 5\' 6"  (167.6 cm) Weight: 74.1 kg (163 lb 6.4 oz) IBW/kg (Calculated) : 63.8  Heparin dosing weight: 74kg  Vital Signs: Temp: 98.7 F (37.1 C) (05/02 2108) Temp Source: Oral (05/02 2108) BP: 146/58 (05/02 2108) Pulse Rate: 59 (05/02 2108)  Labs: Recent Labs    05/30/19 0529 05/30/19 1455 05/31/19 0527 05/31/19 0527 05/31/19 1915 06/01/19 0113  HGB 7.4*   < > 6.9*   < > 9.0* 8.9*  HCT 23.9*   < > 22.7*  --  28.2* 28.0*  PLT 292  --  282  --   --  283  HEPARINUNFRC  --   --   --   --   --  <0.10*  CREATININE 1.36*  --  1.21  --   --  1.19   < > = values in this interval not displayed.    Estimated Creatinine Clearance: 39.5 mL/min (by C-G formula based on SCr of 1.19 mg/dL).   Medical History: Past Medical History:  Diagnosis Date  . Anemia   . Arthritis    "all over" all joints  . Bladder cancer (HCC)    bladder  . Blood transfusion 2008  . Chronic kidney disease    bladder ca in 2008;Kid. failure stage 3  . Complication of anesthesia    Ilios post op  . Coronary artery disease   . CVA (cerebral vascular accident) ([place]) 03/04/2019  . Diabetes mellitus   . DJD (degenerative joint disease)   . Guaiac + stool 06/28/2017  . Heart murmur   . Hypothyroidism    hyper thyroidism recently  . Ileus ([place]) 2001   post hip replacement  . Metabolic acidosis   . Metabolic bone disease   . Myocardial infarction [facility]) age 80    Medications:  Scheduled:  . Chlorhexidine Gluconate Cloth  6 each Topical Daily  . cholecalciferol  5,000 Units Oral Daily  . feeding supplement (ENSURE ENLIVE)  237 mL Oral BID BM  . fenofibrate  160 mg Oral Daily  . finasteride  5 mg Oral Daily  . insulin aspart  0-9 Units Subcutaneous TID WC  . levothyroxine   25 mcg Oral QAC breakfast  . lisinopril  2.5 mg Oral Daily  . metoprolol succinate  25 mg Oral Daily  . multivitamin with minerals  1 tablet Oral Daily  . multivitamins with iron  1 tablet Oral Daily  . omega-3 acid ethyl esters  1 capsule Oral Daily  . sodium bicarbonate  650 mg Oral TID  . sodium chloride flush  10-40 mL Intracatheter Q12H  . tamsulosin  0.4 mg Oral QPC supper   Infusions:  . cefTRIAXone (ROCEPHIN)  IV 2 g (05/31/19 2212)  . heparin    . vancomycin 1,000 mg (05/31/19 1318)    Assessment: 86 yo male on Xarelto 10mg  once daily for LV thrombus prior to admission that was placed on hold per urology due to hematuria. Per Md, urology states that pt with no more hematuria so safe to restart some anticoagulation. Will dose IV Heparin per pharmacy with no bolus per orders. Of note, patient also with PJI with plan for ortho procedure tomorrow 5/3 in PM so timing of when to hold the IV heparin will need to be established  tomorrow. Hgb 6.9 and Plts 282. Note that patient had SQ heparin dose this AM at 0443.   Today, 5/3 0113 = <0.10 units/hr, hematuria stable and no infusion issues.  Hl was drawn from PICC- heparin infusion in peripheral line. H/H=8.9/24, plts = 283  Goal of Therapy:  Heparin level 0.3-0.7 units/ml Monitor platelets by anticoagulation protocol: Yes   Plan:   No IV heparin bolus  Increase heparin drip to 1400 units/hr  Check heparin level 8 hours after start of IV heparin  Check closely for signs/sx's bleeding due to hematuria this admission  Dorrene German 06/01/2019,4:02 AM

## 2019-06-01 NOTE — Anesthesia Postprocedure Evaluation (Signed)
Anesthesia Post Note  Patient: Zyel Lauriano Rymer  Procedure(s) Performed: TOTAL HIP ARTHROPLASTY WITH HARDWARE REMOVAL, A (Left Hip) REIMPLANTATION OF CEMENT SPACER HIP (Left Hip)     Patient location during evaluation: PACU Anesthesia Type: General Level of consciousness: awake and alert Pain management: pain level controlled Vital Signs Assessment: post-procedure vital signs reviewed and stable Respiratory status: spontaneous breathing, nonlabored ventilation, respiratory function stable and patient connected to nasal cannula oxygen Cardiovascular status: blood pressure returned to baseline and stable Postop Assessment: no apparent nausea or vomiting Anesthetic complications: no    Last Vitals:  Vitals:   06/01/19 1419 06/01/19 1900  BP: (!) 146/71 (!) 161/74  Pulse: 62 87  Resp: 18 13  Temp: 36.7 C 36.6 C  SpO2: 97% 94%    Last Pain:  Vitals:   06/01/19 1419  TempSrc: Oral  PainSc:                  Secilia Apps S

## 2019-06-01 NOTE — Progress Notes (Signed)
Patient ID: Casey [name], male   DOB: August 14, 1929, 86 y.o.   MRN: [id number] Subjective: Day of Surgery Procedure(s) (LRB): TOTAL HIP ARTHROPLASTY WITH HARDWARE REMOVAL, A (Left) REIMPLANTATION OF CEMENT SPACER HIP (Left)    Patient reports pain as mild.  Objective:   VITALS:   Vitals:   06/01/19 1309 06/01/19 1419  BP: (!) 147/67 (!) 146/71  Pulse: 70 62  Resp: 17 18  Temp: 98.1 F (36.7 C) 98 F (36.7 C)  SpO2: 93% 97%    Neurovascular intact  LABS Recent Labs    05/30/19 0529 05/30/19 1455 05/31/19 0527 05/31/19 1915 06/01/19 0113  HGB 7.4*   < > 6.9* 9.0* 8.9*  HCT 23.9*   < > 22.7* 28.2* 28.0*  WBC 4.7  --  4.7  --  6.1  PLT 292  --  282  --  283   < > = values in this interval not displayed.    Recent Labs    05/30/19 0529 05/31/19 0527 06/01/19 0113  NA 135 133* 134*  K 3.9 3.5 4.0  BUN 24* 19 19  CREATININE 1.36* 1.21 1.19  GLUCOSE 140* 143* 131*    No results for input(s): LABPT, INR in the last 72 hours.   Assessment/Plan: Infected left total hip replacement   To OR today for resection of an infected left total hip replacement NPO Consent orderd

## 2019-06-01 NOTE — TOC Progression Note (Signed)
Transition of Care [facility]) - Progression Note    Patient Details  Name: NANAYAW WENNBERG MRN: [id number] Date of Birth: May 31, 1929  Transition of Care [facility]) CM/SW Contact  Rachna Schonberger, Juliann Pulse, RN Phone Number: 06/01/2019, 1:38 PM  Clinical Narrative: re sent fl2-await bed offers.      Expected Discharge Plan: [place] Barriers to Discharge: Continued Medical Work up  Expected Discharge Plan and Services Expected Discharge Plan: [place] In-house Referral: Clinical Social Work Discharge Planning Services: CM Consult Post Acute Care Choice: [place] Living arrangements for the past 2 months: Single Family Home                                       Social Determinants of Health (SDOH) Interventions    Readmission Risk Interventions Readmission Risk Prevention Plan 05/13/2019  Transportation Screening Complete  PCP or Specialist Appt within 5-7 Days Complete  Home Care Screening Complete  Medication Review (RN CM) Complete  Some recent data might be hidden

## 2019-06-01 NOTE — TOC Progression Note (Signed)
Transition of Care [facility]) - Progression Note    Patient Details  Name: Casey [name] MRN: [id number] Date of Birth: [birth year]  Transition of Care [facility]) CM/SW Contact  Yazmyne Sara, Juliann Pulse, RN Phone Number: 06/01/2019, 3:17 PM  Clinical Narrative:  Provided dtr/son Melody/Todd w/bed offers.still await post surgery, & PT eval/recc.     Expected Discharge Plan: [place] Barriers to Discharge: Continued Medical Work up  Expected Discharge Plan and Services Expected Discharge Plan: [place] In-house Referral: Clinical Social Work Discharge Planning Services: CM Consult Post Acute Care Choice: [place] Living arrangements for the past 2 months: Single Family Home                                       Social Determinants of Health (SDOH) Interventions    Readmission Risk Interventions Readmission Risk Prevention Plan 05/13/2019  Transportation Screening Complete  PCP or Specialist Appt within 5-7 Days Complete  Home Care Screening Complete  Medication Review (RN CM) Complete  Some recent data might be hidden

## 2019-06-01 NOTE — TOC Progression Note (Signed)
Transition of Care [facility]) - Progression Note    Patient Details  Name: Casey [name] MRN: [id number] Date of Birth: [birth year]  Transition of Care [facility]) CM/SW Contact  Kota Ciancio, Juliann Pulse, RN Phone Number: 06/01/2019, 3:07 PM  Clinical Narrative: 1. 1.5 mi [place] and [facility] [address] [place], [place] [address] [phone] Overall rating Much above average 2. 1.5 mi [place] at [place], [place] [address] [phone] Overall rating Below average 3. 2.2 mi [place], [place] [address] [phone] Overall rating Average 4. 2.4 mi [facility] [place], [place] [address] [phone] Overall rating Below average 5. 2.7 mi [facility] [place], [place] [address] [phone] Overall rating Below average 6. 2.9 mi [facility] and [facility] [address] [place], [place] [address] [phone] Overall rating Much below average 7. 3.1 mi [facility] [place], [place] [address] [phone] Overall rating Above average 8. 3.6 [place], [place] [address] [phone] Overall rating Below average 9. 3.7 mi [facility] [address], [place] [address] [phone] Overall rating Below average 10. 3.8 mi [facility] [place], [place] [address] [phone] Overall rating Average 11. 4.2 mi [facility] at [place], [place] [address] [phone] Overall rating Much above average 12. 4.7 mi [facility] [address] [place], [place] [address] [phone] Overall rating Much above  average 13. 5.5 mi [facility] [address] [place], [place] [address] [phone] Overall rating Average 14. [place], [place] [address] [phone] Overall rating Above average 15. 8.8 mi [facility] and [place], [place] [address] [phone] Overall rating Much below average     16. 9 mi [facility] [address] [place], [place] [address] [phone] Overall rating Below average 17. 9.3 mi [facility] [address] [place], [place] [address] [phone] Overall rating Much above average 18. 10.8 mi [place] at [facility] [address] [place], [place] [phone] Overall rating Much above average 19. 12.8 [facility] [address] [place], [place] [address] [phone] Overall rating Much below average 20. 12.8 mi [place], [place] [address] [phone] Overall rating Much below average 21. 13.1 mi [address] [place], [place] [address] [phone] Overall rating Much below average 22. 14.2 mi [place] [address] [place], [place] [address] [phone] Overall rating Much below average 23. 14.3 mi [address] [place], [place] [id number] [phone] Overall rating Much below average 24. 14.4 mi [facility] at [place], [place] [address] [phone] Overall rating Below average 25. 14.8 mi [place], [place] [address] [phone] Overall rating Above average 26. 16.5 mi [place] [address] [place],  [address] [phone] Overall rating Below  average 27. 16.7 mi [facility] [place],  [address] [phone] Overall rating Much above average 28. 16.9 mi [facility] [place], [place] [address] [phone] Overall rating Much above average 29. 17.8 mi [facility] [place], [place] [address] [phone] Overall rating Below average 30. [id number] [facility] [address] [place], [place] [address] [phone] Overall rating Much below average 1. 1.5 mi [place], [place] [address] [phone] Overall rating Average 2. 1.6 mi [facility] [place], [place] [address] [phone] Overall rating Average 3. 10.8 [facility] [address] [place], [place] [address] [phone] Overall rating Much above average [id number]. [id number] [facility] [address] [place], [place] [address] [phone] Overall rating Below average 5. 11.5 mi [place], [place] [address] [phone] Overall rating Average 6. 13.2 [facility] and [place] [address] [phone] Overall rating Much below average 7. 14.5 mi [place] and [facility] [address] [place] [address] [phone] Overall rating Not available1 8. 18.1 mi [facility] [place] [address] [phone] Overall rating Much above average 9. 18.9 mi [place] [address] [place], [place] [address] [phone] Overall rating Below average 10. 19.9 mi [facility] and [facility] [address] [place], [place] [id number] [phone] Overall rating Above average 11. [place], [place] [id number] [phone] Overall rating Below average 12. 21.2 mi [facility] [address] [place] [address] [phone] Overall rating Average 13. 21.4 [place] [address] [phone] Overall rating Average 14. 23.2 mi [place] [facility] [address] [place] [address] [phone] Overall rating Average 15. 23.3 mi [place], [place] [address] [phone] Overall rating Much below average   Expected Discharge Plan: [place] Barriers to Discharge: Continued Medical Work up  Expected Discharge Plan and Services Expected Discharge Plan: [place] In-house Referral: Clinical Social Work Discharge Planning Services: CM Consult Post Acute Care Choice: [place] arrangements for the past 2 months: Single Family Home                                       Social Determinants of Health (SDOH) Interventions    Readmission Risk Interventions Readmission Risk Prevention Plan 05/13/2019  Transportation Screening Complete  PCP or Specialist Appt within 5-7 Days Complete  Home Care Screening Complete  Medication Review (RN CM) Complete  Some recent data might be hidden

## 2019-06-02 ENCOUNTER — Encounter (HOSPITAL_COMMUNITY): Payer: Self-pay | Admitting: Internal Medicine

## 2019-06-02 DIAGNOSIS — M009 Pyogenic arthritis, unspecified: Secondary | ICD-10-CM | POA: Diagnosis not present

## 2019-06-02 DIAGNOSIS — T8452XA Infection and inflammatory reaction due to internal left hip prosthesis, initial encounter: Secondary | ICD-10-CM | POA: Diagnosis not present

## 2019-06-02 LAB — BASIC METABOLIC PANEL
Anion gap: 8 (ref 5–15)
BUN: 23 mg/dL (ref 8–23)
CO2: 20 mmol/L — ABNORMAL LOW (ref 22–32)
Calcium: 9 mg/dL (ref 8.9–10.3)
Chloride: 106 mmol/L (ref 98–111)
Creatinine, Ser: 1.08 mg/dL (ref 0.61–1.24)
GFR calc Af Amer: >60 mL/min (ref >60)
GFR calc non Af Amer: >60 mL/min (ref >60)
Glucose, Bld: 224 mg/dL — ABNORMAL HIGH (ref 70–99)
Potassium: 4.6 mmol/L (ref 3.5–5.1)
Sodium: 134 mmol/L — ABNORMAL LOW (ref 135–145)

## 2019-06-02 LAB — TYPE AND SCREEN
ABO/RH(D): A POS
Antibody Screen: NEGATIVE
Unit division: 0
Unit division: 0
Unit division: 0

## 2019-06-02 LAB — BPAM RBC
Blood Product Expiration Date: 202105082359
Blood Product Expiration Date: 202105202359
Blood Product Expiration Date: 202105222359
ISSUE DATE / TIME: 202105020935
ISSUE DATE / TIME: 202105021425
ISSUE DATE / TIME: 202105030958
Unit Type and Rh: 600
Unit Type and Rh: 6200
Unit Type and Rh: 6200

## 2019-06-02 LAB — CBC
HCT: 28.2 % — ABNORMAL LOW (ref 39.0–52.0)
Hemoglobin: 9.2 g/dL — ABNORMAL LOW (ref 13.0–17.0)
MCH: 28.6 pg (ref 26.0–34.0)
MCHC: 32.6 g/dL (ref 30.0–36.0)
MCV: 87.6 fL (ref 80.0–100.0)
Platelets: 278 10*3/uL (ref 150–400)
RBC: 3.22 MIL/uL — ABNORMAL LOW (ref 4.22–5.81)
RDW: 15.2 % (ref 11.5–15.5)
WBC: 6 10*3/uL (ref 4.0–10.5)
nRBC: 0 % (ref 0.0–0.2)

## 2019-06-02 LAB — GLUCOSE, CAPILLARY
Glucose-Capillary: 179 mg/dL — ABNORMAL HIGH (ref 70–99)
Glucose-Capillary: 258 mg/dL — ABNORMAL HIGH (ref 70–99)
Glucose-Capillary: 364 mg/dL — ABNORMAL HIGH (ref 70–99)
Glucose-Capillary: 309 mg/dL — ABNORMAL HIGH (ref 70–99)

## 2019-06-02 LAB — CULTURE, BLOOD (ROUTINE X 2)
Culture: NO GROWTH
Culture: NO GROWTH
Special Requests: ADEQUATE

## 2019-06-02 LAB — BODY FLUID CULTURE: Culture: NO GROWTH

## 2019-06-02 MED ORDER — HEPARIN (PORCINE) 25000 UT/250ML-% IV SOLN
1200.0000 [IU]/h | INTRAVENOUS | Status: DC
  Administered 2019-06-02: 1100 [IU]/h via INTRAVENOUS
  Filled 2019-06-02: qty 250

## 2019-06-02 MED ORDER — INSULIN GLARGINE 100 UNIT/ML ~~LOC~~ SOLN
8.0000 [IU] | SUBCUTANEOUS | Status: DC
  Administered 2019-06-02 – 2019-06-03 (×2): 8 [IU] via SUBCUTANEOUS
  Filled 2019-06-02 (×3): qty 0.08

## 2019-06-02 NOTE — Consult Note (Signed)
[place] Nurse Consult Note: Admitted for left hip infection s/p arthroplasty.  Consult for pressure injuries but none found.  Confirmed with bedside RN who had not seen pressure injuries either.  No further WOC needs.  Will not follow at this time.  Please re-consult if needed.  Domenic Moras MSN, RN, FNP-BC CWON Wound, Ostomy, Continence Nurse Pager [phone]

## 2019-06-02 NOTE — Progress Notes (Signed)
PROGRESS NOTE    Casey [name]  [id number] DOB: 05/24/1929 DOA: 05/28/2019 PCP: Glenda Chroman, MD   Chief Complaint  Patient presents with  . Hypoglycemia  . Fever    Brief Narrative:  HPI per Dr. Nils Pyle Legrande is a 86 y.o. male with medical history significant of diabetes mellitus, hypothyroidism, hypertension, chronic kidney disease stage III, anemia. Patient has a hip surgery with left hip replacement in November 2020.  Patient was found to have multiloculated joint effusion concerning for septic arthritis and was admitted on 05/09/2019 to [facility], was seen in consultation by Dr. Percell Miller and Dr. 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  Patient states that he got his second dose of COVID-19 vaccine last Thursday, moderna vaccine. Patient has been experiencing intermittent fevers with a T-max at home of 102.9 Fahrenheit. His highest temperature here in the ER was 100.9 Fahrenheit. He has been on IV penicillin at home. He states he has been compliant with this. He denies any new hip pain.   Patient did mention that he has been having diarrhea 2-3 episodes, loose stools every day since the last for 5 days.  Also did complain of hypoglycemia with his blood glucose level dropping down to 20s, he stopped his oral medications and it has improved since then.  Patient complains of chills.  Patient denies any chest pain.  Denies any shortness of breath.  He does complain of some coughing with occasional sputum production which is clear.  Denies any abdominal pain, nausea or any vomitings.  Denies any hip pain.  Denies any discharge from the left hip site.  While the patient was in the ER he did have a bowel movement which was loose, not watery, there was some bright red blood in the bedside  commode.  Patient mentions that blood came out from his bowels and also some from his urine.  Patient did mention that he took his Xarelto this morning.   ED Course:  Haywood Pao spoke to Dr. Percell Miller regarding patient status, and also spoke with Dr Baxter Flattery ID.  Urinalysis was checked and no source of any infection was seen, chest x-ray showed no evidence of any pneumonia.  Patient was treated with broad-spectrum antibiotics with vancomycin, cefepime, Flagyl.  Assessment & Plan:   Principal Problem:   Prosthetic joint infection of left hip (HCC) Active Problems:   Sepsis ([place])   Fever   Hematuria, gross   Status post hip surgery  1 sepsis, possibly likely secondary to septic arthritis of the hip POA/recent septic arthritis of the left hip prosthesis status post irrigation and debridement 05/12/2019 per Dr. Percell Miller Patient presenting and meeting criteria for sepsis with a fever with a temp of 102, lactic acid level of 1.9, elevated CRP, tachycardia.  Patient had presented with fever and chills prior to admission.  Initial concern for left hip infection.  CT of the left hip with concern for multiloculated abscess within the anterior hip, interval resolution of posterior multilocular collection seen on prior CT, hyperdense probable blood products within the posterior bladder with markedly distended bladder.  Orthopedics consulted and orthopedics recommended IR aspiration of fluid collection which was done on 05/29/2019 with cultures pending.  Aspirate noted to be turbid with elevated WBCs with neutrophils.  Continue IV  vancomycin and IV Rocephin. Orthopedics recommending that left total hip replacement should be resected with plans for two-stage treatment of his infected left total hip with removal of implant and placement of a antibiotic spacer with concurrent IV antibiotics of 6 weeks starting from time of surgery.  Patient underwent surgery 06/01/2019.  Patient status post a total of 3 units of packed red  blood cells.  Per orthopedics.  Per ID.  Follow.   2.  Gross hematuria/urinary retention Patient noted to have a gross hematuria and noted to have some urinary retention overnight.  Foley catheter placed with 1 L of bloody urine immediately coming out per RN.  Anticoagulation on hold.  Case discussed with urology who recommended CT of the abdomen and pelvis which showed moderate hemorrhage within the bladder.  Foley catheter present.  Patient seen by urology who recommended flushing Foley as needed for symptoms/obstruction and recommending continue Foley catheter on discharge per urology with outpatient follow-up.  Due to drop in hemoglobin down to 6.9 urology reassessed the patient and irrigated and aspirated at bedside with return of several small flecks of clot which per urology could easily passed in the catheter and irrigated until urine was light pink and translucent.  Per urology they do not suspect patient has an active ongoing GU bleeding.  Follow H&H.  Transfusion threshold hemoglobin < 7.   3.  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  Urology consulted.  Urology to determine when okay to resume anticoagulation.  Orthopedics assessing to see whether patient may need surgery.  Spoke with urology feel patient does not have any active ongoing GI bleed at this time and okay with starting patient on heparin without a bolus.  Heparin was started however was held prior to surgery on 06/01/2019.  Spoke with orthopedics who was fine resuming patient on IV heparin this evening at 6 PM, monitoring overnight, if no significant bleeding and hemoglobin remained stable may consider transitioning back to home regimen of Xarelto.    4.  Acute kidney injury on chronic kidney disease stage IIIb, POA Avoid nephrotoxic agents.  Renal function improved with hydration.  Currently at baseline with a  creatinine of 1.08.  Saline lock IV fluids.  Follow.  5.  Chronic anemia Likely secondary to chronic kidney disease.  Follow H&H.  6.  BPH Continue Flomax.  Patient now with indwelling Foley catheter which patient will be discharged home on with outpatient follow-up with urology per urology recommendations..  7.  Hypothyroidism Continue Synthroid.  8.  Diabetes mellitus type 2 Hemoglobin A1c 7.6 (05/10/2019).  CBG of 179 today.  CBGs noted to be in the 200s.  Continue to hold oral hypoglycemic agents.  Start Lantus 8 units daily.  Continue sliding scale insulin.  May need to place on meal coverage NovoLog however will monitor for now.   9.  Nonobstructive coronary artery disease Continue ACE inhibitor, beta-blocker, Lovaza.    10.  Diarrhea C. difficile PCR negative.  Continue probiotics.  Imodium as needed.  11.  Acute blood loss anemia Secondary to gross hematuria.  Hemoglobin currently at 0.2 from 8.9 from 6.9.  Status post transfusion 3 units packed red blood cells.  Due to drop in hemoglobin and ongoing gross hematuria urology reassessed patient 05/31/2019, and feel the patient's hematuria was secondary to traumatic catheterization while on anticoagulation.  Urology irrigated and aspirated at bedside with return of several small flecks of  clot which per urology could easily passed in the catheter and irrigated until urine was light pink and translucent.  Per urology they do not suspect patient has an active GU bleeding.  Follow H&H.  Transfusion threshold hemoglobin < 7.  12.  Acute gouty arthritis Improved on current regimen of prednisone.  Follow.     DVT prophylaxis: Heparin Code Status: Full Family Communication: Updated patient and son at bedside.. Disposition:   Status is: Inpatient    Dispo: The patient is from: Home              Anticipated d/c is to: SNF              Anticipated d/c date is: To be determined              Patient currently admitted with probable  septic hip, currently on IV antibiotics, also with gross hematuria, was on chronic anticoagulation for LV thrombus which is to be resumed this evening as patient postop.  Orthopedics, ID, urology following.          Consultants:   Orthopedics: Dr. Percell Miller 05/29/2019  ID: Dr. Baxter Flattery 05/29/2019  Urology: Dr. Louis Meckel 05/29/2019  Procedures:   CT abdomen and pelvis 05/29/2019  CT left hip 05/29/2019  Chest x-ray 05/28/2019  Plain films of the left hip 05/28/2019  Transfuse 2 units packed red blood cells 05/31/2019  Transfuse 1 unit packed red blood cells 06/01/2019  Resection of left total hip arthroplasty/placement of cemented articulating antibiotic spacer per Dr. Alvan Dame 06/01/2019  Antimicrobials:   IV vancomycin 05/28/2019  IV cefepime 05/28/2019>>>> 05/29/2019  IV Flagyl 05/28/2019>>>> 05/29/2019  IV Rocephin 05/29/2019   Subjective: Patient states improvement with right knee pain.  Denies any shortness of breath.  Denies any chest pain.  Still with dark red blood noted in Foley bag.   Objective: Vitals:   06/01/19 2230 06/02/19 0025 06/02/19 0537 06/02/19 0910  BP: 133/70 (!) 146/70 126/69 (!) 158/68  Pulse: 72 70 67 74  Resp: 18 18 18 14   Temp: 98 F (36.7 C) (!) 97.5 F (36.4 C) (!) 97.5 F (36.4 C) (!) 97.4 F (36.3 C)  TempSrc: Axillary Oral Oral Axillary  SpO2: 100% 100% 95% 100%  Weight:      Height:        Intake/Output Summary (Last 24 hours) at 06/02/2019 1157 Last data filed at 06/02/2019 0620 Gross per 24 hour  Intake 3976 ml  Output 2410 ml  Net 1566 ml   Filed Weights   05/30/19 0321 05/31/19 0438 06/01/19 0500  Weight: 73.3 kg 74.1 kg 76.2 kg    Examination:  General exam: NAD Respiratory system: Lungs clear to auscultation bilaterally.  No wheezes, no crackles, no rhonchi.  Normal respiratory effort. Cardiovascular system: RRR no murmurs rubs or gallops.  No JVD.  No lower extremity edema.   Gastrointestinal system: Abdomen is nontender,  nondistended, soft, positive bowel sounds.  No rebound.  No guarding.  Central nervous system: Alert and oriented. No focal neurological deficits. Extremities: Right nontender to palpation, decreased warmth.  Symmetric 5 x 5 power. Skin: No rashes, lesions or ulcers Psychiatry: Judgement and insight appear normal. Mood & affect appropriate.     Data Reviewed: I have personally reviewed following labs and imaging studies  CBC: Recent Labs  Lab 05/28/19 1300 05/28/19 1300 05/29/19 0540 05/29/19 0540 05/30/19 0529 05/30/19 1455 05/31/19 0527 05/31/19 0527 05/31/19 1915 06/01/19 0113 06/01/19 1716 06/01/19 1815 06/02/19 0443  WBC 5.4   < >  6.2  --  4.7  --  4.7  --   --  6.1  --   --  6.0  NEUTROABS 3.1  --   --   --   --   --   --   --   --   --   --   --   --   HGB 9.1*   < > 8.0*   < > 7.4*   < > 6.9*   < > 9.0* 8.9* 10.9* 10.5* 9.2*  HCT 29.5*   < > 25.8*   < > 23.9*   < > 22.7*   < > 28.2* 28.0* 32.0* 31.0* 28.2*  MCV 86.8   < > 88.4  --  87.2  --  87.3  --   --  87.2  --   --  87.6  PLT 311   < > 279  --  292  --  282  --   --  283  --   --  278   < > = values in this interval not displayed.    Basic Metabolic Panel: Recent Labs  Lab 05/29/19 0540 05/29/19 0540 05/30/19 0529 05/30/19 0529 05/31/19 0527 06/01/19 0113 06/01/19 1716 06/01/19 1815 06/02/19 0443  NA 133*   < > 135   < > 133* 134* 133* 133* 134*  K 4.2   < > 3.9   < > 3.5 4.0 4.6 4.9 4.6  CL 106   < > 107   < > 107 104 104 102 106  CO2 19*  --  20*  --  23 22  --   --  20*  GLUCOSE 99   < > 140*   < > 143* 131* 212* 231* 224*  BUN 26*   < > 24*   < > 19 19 16 17 23   CREATININE 1.49*   < > 1.36*   < > 1.21 1.19 1.00 1.10 1.08  CALCIUM 9.3  --  9.0  --  8.7* 9.1  --   --  9.0  MG  --   --  1.7  --  2.0  --   --   --   --    < > = values in this interval not displayed.    GFR: Estimated Creatinine Clearance: 43.5 mL/min (by C-G formula based on SCr of 1.08 mg/dL).  Liver Function Tests: Recent  Labs  Lab 05/28/19 1300  AST 22  ALT 12  ALKPHOS 33*  BILITOT 0.5  PROT 6.3*  ALBUMIN 2.5*    CBG: Recent Labs  Lab 06/01/19 1417 06/01/19 1916 06/01/19 2001 06/02/19 0723 06/02/19 1127  GLUCAP 183* 229* 246* 179* 258*     Recent Results (from the past 240 hour(s))  Respiratory Panel by RT PCR (Flu A&B, Covid) - Nasopharyngeal Swab     Status: None   Collection Time: 05/28/19  1:00 PM   Specimen: Nasopharyngeal Swab  Result Value Ref Range Status   SARS Coronavirus 2 by RT PCR NEGATIVE NEGATIVE Final    Comment: (NOTE) SARS-CoV-2 target nucleic acids are NOT DETECTED. The SARS-CoV-2 RNA is generally detectable in upper respiratoy specimens during the acute phase of infection. The lowest concentration of SARS-CoV-2 viral copies this assay can detect is 131 copies/mL. A negative result does not preclude SARS-Cov-2 infection and should not be used as the sole basis for treatment or other patient management decisions. A negative result may occur with  improper specimen collection/handling, submission of  specimen other than nasopharyngeal swab, presence of viral mutation(s) within the areas targeted by this assay, and inadequate number of viral copies (<131 copies/mL). A negative result must be combined with clinical observations, patient history, and epidemiological information. The expected result is Negative. Fact Sheet for Patients:  PinkCheek.be Fact Sheet for Healthcare Providers:  GravelBags.it This test is not yet ap proved or cleared by the [place] FDA and  has been authorized for detection and/or diagnosis of SARS-CoV-2 by FDA under an Emergency Use Authorization (EUA). This EUA will remain  in effect (meaning this test can be used) for the duration of the COVID-19 declaration under Section 564(b)(1) of the Act, 21 U.S.C. section 360bbb-3(b)(1), unless the authorization is terminated or revoked  sooner.    Influenza A by PCR NEGATIVE NEGATIVE Final   Influenza B by PCR NEGATIVE NEGATIVE Final    Comment: (NOTE) The Xpert Xpress SARS-CoV-2/FLU/RSV assay is intended as an aid in  the diagnosis of influenza from Nasopharyngeal swab specimens and  should not be used as a sole basis for treatment. Nasal washings and  aspirates are unacceptable for Xpert Xpress SARS-CoV-2/FLU/RSV  testing. Fact Sheet for Patients: PinkCheek.be Fact Sheet for Healthcare Providers: GravelBags.it This test is not yet approved or cleared by the [place] FDA and  has been authorized for detection and/or diagnosis of SARS-CoV-2 by  FDA under an Emergency Use Authorization (EUA). This EUA will remain  in effect (meaning this test can be used) for the duration of the  Covid-19 declaration under Section 564(b)(1) of the Act, 21  U.S.C. section 360bbb-3(b)(1), unless the authorization is  terminated or revoked. Performed at [facility], [place] [address] [place], [place] [address]   Blood culture (routine x 2)     Status: None   Collection Time: 05/28/19  1:00 PM   Specimen: BLOOD  Result Value Ref Range Status   Specimen Description   Final    BLOOD RIGHT ANTECUBITAL Performed at [facility] [address], [place], [place] [address]    Special Requests   Final    BOTTLES DRAWN AEROBIC AND ANAEROBIC Blood Culture results may not be optimal due to an excessive volume of blood received in culture bottles Performed at [facility] [address], [place], [place] [address]    Culture   Final    NO GROWTH 5 DAYS Performed at [facility], [place] [address], [place], [place] [address]    Report Status 06/02/2019 FINAL  Final  Blood culture (routine x 2)     Status: None   Collection Time: 05/28/19  1:20 PM   Specimen: BLOOD  Result Value Ref Range Status   Specimen Description    Final    BLOOD LEFT ANTECUBITAL Performed at [facility] [address], [place], [place] [address]    Special Requests   Final    BOTTLES DRAWN AEROBIC AND ANAEROBIC Blood Culture adequate volume Performed at [facility] [address], [place], [place] [address]    Culture   Final    NO GROWTH 5 DAYS Performed at [facility], [place] [address], [place],  [address]    Report Status 06/02/2019 FINAL  Final  Urine culture     Status: None   Collection Time: 05/28/19  4:27 PM   Specimen: In/Out Cath Urine  Result Value Ref Range Status   Specimen Description   Final    IN/OUT  CATH URINE Performed at [facility], [place] [address], [place], [place] [address]    Special Requests   Final    NONE Performed at [facility], [place] [address], [place], [place] [address]    Culture   Final    NO GROWTH Performed at [facility], [place] [address], [place], [place] [id number]    Report Status 05/29/2019 FINAL  Final  C Difficile Quick Screen w PCR reflex     Status: None   Collection Time: 05/29/19 12:34 AM   Specimen: STOOL  Result Value Ref Range Status   C Diff antigen NEGATIVE NEGATIVE Final   C Diff toxin NEGATIVE NEGATIVE Final   C Diff interpretation No C. difficile detected.  Final    Comment: Performed at [facility], [place] [address], [place], [place] [address]  Body fluid culture     Status: None   Collection Time: 05/29/19  4:29 PM   Specimen: Synovium; Body Fluid  Result Value Ref Range Status   Specimen Description   Final    SYNOVIAL LEFT HIP Performed at [facility] [address], [place], [place] [address]    Special Requests   Final    NONE Performed at [facility], [place] [address], [place], [place] [address]    Gram Stain   Final    ABUNDANT WBC PRESENT,BOTH PMN AND MONONUCLEAR NO ORGANISMS SEEN Gram Stain  Report Called to,Read Back By and Verified With: C.FRANKLIN AT 1736 ON 05/29/19 BY N.Dagen Beevers Performed at [facility], [place] [address] [place], [place] [address]    Culture   Final    NO GROWTH 3 DAYS Performed at [facility], [place] [address], [place], [place] [address]    Report Status 06/02/2019 FINAL  Final  Anaerobic culture     Status: None (Preliminary result)   Collection Time: 05/29/19  4:29 PM   Specimen: Synovium; Synovial Fluid  Result Value Ref Range Status   Specimen Description   Final    SYNOVIAL LEFT HIP Performed at [facility] [address] [place], [place] [address]    Special Requests   Final    NONE Performed at [facility], [place] [address] [place], [place] [address]    Gram Stain   Final    ABUNDANT WBC PRESENT,BOTH PMN AND MONONUCLEAR NO ORGANISMS SEEN Performed at [facility], [place] [address] [place], [place] [address]    Culture   Final    NO ANAEROBES ISOLATED; CULTURE IN PROGRESS FOR 5 DAYS   Report Status PENDING  Incomplete  MRSA PCR Screening     Status: None   Collection Time: 06/01/19  5:29 AM   Specimen: Nasal Mucosa; Nasopharyngeal  Result Value Ref Range Status   MRSA by PCR NEGATIVE NEGATIVE Final    Comment:        The GeneXpert MRSA Assay (FDA approved for NASAL specimens only), is one component of a comprehensive MRSA colonization surveillance program. It is not intended to diagnose MRSA infection nor to guide or monitor treatment for MRSA infections. Performed at [facility], [place] [address], [place], [place] [address]   Aerobic/Anaerobic Culture (surgical/deep wound)     Status: None (Preliminary result)   Collection Time: 06/01/19  4:47 PM   Specimen: Wound; Body Fluid  Result Value Ref Range Status   Specimen Description   Final  FLUID LEFT HIP Performed at [facility], [place] [address], [place], [place]  [address]    Special Requests IN CUP  Final   Gram Stain   Final    ABUNDANT WBC PRESENT, PREDOMINANTLY PMN NO ORGANISMS SEEN    Culture   Final    NO GROWTH < 12 HOURS Performed at [facility] [address], [place], [place] [address]    Report Status PENDING  Incomplete  Aerobic/Anaerobic Culture (surgical/deep wound)     Status: None (Preliminary result)   Collection Time: 06/01/19  4:52 PM   Specimen: Wound; Body Fluid  Result Value Ref Range Status   Specimen Description   Final    FLUID LEFT HIP Performed at [facility], [address], [place], [place] [address]    Special Requests   Final    FLUID NO. 2 Performed at [facility], [place] [address], [place], [place] [address]    Gram Stain   Final    ABUNDANT WBC PRESENT, PREDOMINANTLY PMN NO ORGANISMS SEEN    Culture   Final    NO GROWTH < 12 HOURS Performed at [facility] [address] [place], [place] [address]    Report Status PENDING  Incomplete         Radiology Studies: DG Pelvis Portable  Result Date: 06/01/2019 CLINICAL DATA:  Post op hardware removal EXAM: PORTABLE PELVIS 1-2 VIEWS COMPARISON:  CT 05/29/2019, radiograph 05/28/2019 FINDINGS: Stable appearing right hip replacement. Previous left hip replacement. Interval removal of acetabular component and fixating screw. Femoral component remains in place. Moderate gas in the joint space and hip soft tissues. IMPRESSION: Removal of hardware from left acetabulum. Moderate gas in the joint space and soft tissues. Electronically Signed   By: Donavan Foil M.D.   On: 06/01/2019 19:37        Scheduled Meds: . acetaminophen  500 mg Oral Q6H  . Chlorhexidine Gluconate Cloth  6 each Topical Daily  . cholecalciferol  5,000 Units Oral Daily  . docusate sodium  100 mg Oral BID  . feeding supplement (ENSURE ENLIVE)  237 mL Oral BID BM  . fenofibrate  160 mg Oral Daily  . ferrous sulfate  325 mg Oral TID PC  .  finasteride  5 mg Oral Daily  . insulin aspart  0-9 Units Subcutaneous TID WC  . insulin glargine  8 Units Subcutaneous BH-[id number]  . levothyroxine  25 mcg Oral QAC breakfast  . lisinopril  2.5 mg Oral Daily  . metoprolol succinate  25 mg Oral Daily  . multivitamin with minerals  1 tablet Oral Daily  . multivitamins with iron  1 tablet Oral Daily  . omega-3 acid ethyl esters  1 capsule Oral Daily  . polyethylene glycol  17 g Oral BID  . predniSONE  40 mg Oral QAC breakfast  . sodium bicarbonate  650 mg Oral TID  . sodium chloride flush  10-40 mL Intracatheter Q12H  . tamsulosin  0.4 mg Oral QPC supper   Continuous Infusions: . sodium chloride 100 mL/hr at 06/02/19 0805  . cefTRIAXone (ROCEPHIN)  IV 2 g (06/01/19 2202)  . methocarbamol (ROBAXIN) IV    . vancomycin 1,000 mg (06/01/19 1116)     LOS: 5 days    Time spent: 40 minutes    Irine Seal, MD [facility]   To contact the attending provider between 7A-7P or the covering provider during after hours 7P-7A, please log  into the web site [email or web address] and access using universal [place] password for that web site. If you do not have the password, please call the hospital operator.  06/02/2019, 11:57 AM

## 2019-06-02 NOTE — Progress Notes (Signed)
Supervised RN student Larna Daughters administer iron and fenofribrate.

## 2019-06-02 NOTE — Evaluation (Signed)
Physical Therapy Evaluation Patient Details Name: Casey [name] MRN: [id number] DOB: [birth year] Today's Date: 06/02/2019   History of Present Illness  Casey [name] is a 86 y.o. male with medical history significant of diabetes mellitus, hypothyroidism, hypertension, chronic kidney disease stage III, anemia, stroke, Post Direct anterior left THA in 11/20. Patient was admitted to [facility] on 05/09/19 for concerns of septic hip arthritis and  infection and possible Lt thigh abcess. Pt is now s/p I&D to Lt hip on 05/12/19. Now returns 05/28/19 for removal LTHA and placement of antibiotic spacer left hip.  Clinical Impression  The patient is quite motivated and wants to mobilize and learn his precautions. Assisted to [facility] then to recliner, PWB on LLE. Provided Posterior hip precautions handout and reviewed. Patient plans to go to SNF for rehab. Pt admitted with above diagnosis. Pt currently with functional limitations due to the deficits listed below (see PT Problem List). Pt will benefit from skilled PT to increase their independence and safety with mobility to allow discharge to the venue listed below.       Follow Up Recommendations SNF    Equipment Recommendations  None recommended by PT    Recommendations for Other Services       Precautions / Restrictions Precautions Precautions: Fall;Posterior Hip Restrictions Weight Bearing Restrictions: Yes LLE Weight Bearing: Partial weight bearing LLE Partial Weight Bearing Percentage or Pounds: 50 percent      Mobility  Bed Mobility Overal bed mobility: Needs Assistance Bed Mobility: Supine to Sit     Supine to sit: Min assist     General bed mobility comments: assist with LLE, extra time, cues for posterior precautions multiple times  Transfers Overall transfer level: Needs assistance Equipment used: Rolling walker (2 wheeled) Transfers: Sit to/from [facility] Sit to Stand: Mod assist Stand pivot transfers: Mod assist       General transfer comment: assist to rise from bed at [facility],, Steady assist for standing, OWB/ on Left/Step to to turn to [facility] then pivot to recliner.  Ambulation/Gait                Stairs            Wheelchair Mobility    Modified Rankin (Stroke Patients Only)       Balance Overall balance assessment: Needs assistance;History of Falls Sitting-balance support: Feet supported;Bilateral upper extremity supported Sitting balance-Leahy Scale: Good     Standing balance support: During functional activity;Bilateral upper extremity supported Standing balance-Leahy Scale: Poor Standing balance comment: reliant on UE's                             Pertinent Vitals/Pain Pain Assessment: No/denies pain    Home Living Family/patient expects to be discharged to:: Skilled nursing facility Living Arrangements: Spouse/significant other Available Help at Discharge: Family;Available 24 hours/day Type of Home: House Home Access: Stairs to enter Entrance Stairs-Rails: None Entrance Stairs-Number of Steps: 1 Home Layout: One level Home Equipment: Grab bars - tub/shower;Grab bars - toilet;Walker - 2 wheels;Walker - 4 wheels;Bedside commode;Cane - single point      Prior Function Level of Independence: Independent with assistive device(s)         Comments: pt using rollato and 2 wheeled Rwr for mobility     Hand Dominance        Extremity/Trunk Assessment   Upper Extremity Assessment Upper Extremity Assessment: Overall WFL for tasks assessed    Lower Extremity  Assessment Lower Extremity Assessment: LLE deficits/detail LLE Deficits / Details: tendending to have some internal rotation position    Cervical / Trunk Assessment Cervical / Trunk Assessment: Kyphotic  Communication   Communication: No difficulties  Cognition Arousal/Alertness: Awake/alert Behavior During Therapy: WFL for tasks assessed/performed Overall Cognitive Status: Within  Functional Limits for tasks assessed                                        General Comments      Exercises General Exercises - Lower Extremity Ankle Circles/Pumps: AROM;15 reps;Both   Assessment/Plan    PT Assessment Patient needs continued PT services  PT Problem List Decreased strength;Decreased balance;Decreased activity tolerance;Decreased mobility;Decreased knowledge of use of DME       PT Treatment Interventions DME instruction;Gait training;Functional mobility training    PT Goals (Current goals can be found in the Care Plan section)  Acute Rehab PT Goals Patient Stated Goal: to go to rehab PT Goal Formulation: With patient Time For Goal Achievement: 06/16/19 Potential to Achieve Goals: Good    Frequency Min 2X/week   Barriers to discharge        Co-evaluation               AM-PAC PT "6 Clicks" Mobility  Outcome Measure Help needed turning from your back to your side while in a flat bed without using bedrails?: A Lot Help needed moving from lying on your back to sitting on the side of a flat bed without using bedrails?: A Lot Help needed moving to and from a bed to a chair (including a wheelchair)?: A Lot Help needed standing up from a chair using your arms (e.g., wheelchair or bedside chair)?: A Lot Help needed to walk in hospital room?: Total Help needed climbing 3-5 steps with a railing? : Total 6 Click Score: 10    End of Session   Activity Tolerance: Patient tolerated treatment well Patient left: in chair;with call bell/phone within reach;with chair alarm set Nurse Communication: Mobility status PT Visit Diagnosis: History of falling (Z91.81);Muscle weakness (generalized) (M62.81);Difficulty in walking, not elsewhere classified (R26.2)    Time: [id number] PT Time Calculation (min) (ACUTE ONLY): 54 min   Charges:   PT Evaluation $PT Eval Moderate Complexity: 1 Mod PT Treatments $Therapeutic Activity: 23-37 mins $Self  Care/Home Management: [place] Pager [phone] Office [phone]   Claretha Cooper 06/02/2019, 10:55 AM

## 2019-06-02 NOTE — Progress Notes (Signed)
Pt's pain is well controlled with ice and prn medication. Pt has sat up and dangled legs off side of bed. TED hose and SCDs applied. Pt using incentive spirometer. Vitals are stable. After irrigating catheter urine output is adequate. Will continue to monitor and carry out plan of care.

## 2019-06-02 NOTE — Progress Notes (Signed)
Inpatient Diabetes Program Recommendations  AACE/ADA: New Consensus Statement on Inpatient Glycemic Control (2015)  Target Ranges:  Prepandial:   less than 140 mg/dL      Peak postprandial:   less than 180 mg/dL (1-2 hours)      Critically ill patients:  140 - 180 mg/dL   Lab Results  Component Value Date   GLUCAP 258 (H) 06/02/2019   HGBA1C 7.6 (H) 05/10/2019    Review of Glycemic Control Results for Casey [name], Casey [name] (MRN [id number]) as of 06/02/2019 13:37  Ref. Range 06/01/2019 19:16 06/01/2019 20:01 06/02/2019 07:23 06/02/2019 11:27  Glucose-Capillary Latest Ref Range: 70 - 99 mg/dL 229 (H) 246 (H) 179 (H) 258 (H)   Diabetes history: DM 2 Outpatient Diabetes medications:  Amaryl 4 mg bid Current orders for Inpatient glycemic control:  Novolog sensitive tid with meals Lantus 8 units q AM Prednisone 40 mg daily Inpatient Diabetes Program Recommendations:   If post-prandial blood sugars continue to be elevated, consider adding Novolog meal coverage 2 units tid with meals (hold if patient eats less than 50%).   Thanks  Adah Perl, RN, BC-ADM Inpatient Diabetes Coordinator Pager [phone] (8a-5p)

## 2019-06-02 NOTE — Op Note (Signed)
NAME: Casey [name], Casey [name] MEDICAL RECORD [id number] ACCOUNT [id number] DATE OF BIRTH:[birth year] FACILITY: [facility] LOCATION: [facility] PHYSICIAN:Emmaclaire Switala DAlvan Dame, MD  OPERATIVE REPORT  DATE OF PROCEDURE:  06/01/2019  PREOPERATIVE DIAGNOSIS:  Status post left total hip arthroplasty complicated by infection with history of recent open excisional debridement with head and polyethylene liner exchange with persistent infection.  POSTOPERATIVE DIAGNOSIS:  Status post left total hip arthroplasty complicated by infection with history of recent open excisional debridement with head and polyethylene liner exchange with persistent infection.  PROCEDURE: 1.  Resection of left total hip arthroplasty. 2.  Placement of cemented articulating antibiotic spacer. 3.  DePuy components used a size 32 mm inner diameter with 42 mm outer diameter cemented polyethylene liner, Prostalac with a size 2 Summit basic cemented stem.  These components were cemented in with cement laden with antibiotics.  A total of 4 grams of  vancomycin and 4.8 grams of tobramycin were used.  SURGEON:  Paralee Cancel, MD  ASSISTANT:  Griffith Citron, PA-C.  Note that Ms. Casey [name] was present for the entirety of the case from preoperative positioning, perioperative management of the operative extremity, general facilitation of case, and primary wound closure.  ANESTHESIA:  General.  BLOOD LOSS:  800 mL.  DRAINS:  None.  COMPLICATIONS:  None.  PATHOLOGY AND SPECIMEN:  We did take aspirates of the synovial fluid from inside the joint that did have a cloudy purulent appearance to pathology per infectious disease recommendations despite the past 2 weeks of being on IV antibiotics.  INDICATIONS:  The patient is a pleasant 86 year old male with history of an index left total hip arthroplasty in November 2020.  He had returned to his normal quality of life including golf.  He unfortunately had a stroke in February 2021 which was  minor.  With  regards to deficits, it had only affected his speech short term.  He regained all normal speech.  He did not have any upper or lower extremity deficits.  Unfortunately, he developed an infection in his left hip, presenting to his primary  surgeon with increasing pain.  He was diagnosed with having a left hip infection.  He was taken to the operating room approximately 2-1/2 weeks ago and underwent open I and D of the hip with liner and head ball exchange.  He was placed on IV antibiotics  directed through infectious disease.  Unfortunately, despite this treatment he had persistent pain and fevers and was admitted to the hospital approximately 3 days ago.  I was asked to assist in management of his left hip infection.  Aspiration confirmed  that he had 20,000 white blood cells still in his hip joint despite his antibiotic and this treatment.  It was my recommendation at this point that he have his hip resected and treat this as a 2-stage technique.  I discussed this with the patient and  his family.  Despite his age and medical health it was felt that his desired activity level warranted further treatment.  Consent was obtained after reviewing the risks of persistent recurrent infection, dislocation, need for future surgeries.  The  postoperative course and plan was outlined including 6 weeks of IV antibiotics to restart from the date of this procedure.  Following that, we will follow his clinical and laboratory recovery, following his sedimentation rate and C-reactive protein to  make certain that they normalize prior to reimplantation consideration.  Consent was obtained.  PROCEDURE IN DETAIL:  Patient was brought to the operative theater.  Once adequate  anesthesia, preoperative antibiotics administered, which included 2 g of Ancef plus the ceftriaxone he had already been receiving while in the hospital.  Anesthesia did  include a general anesthetic with arterial lines to monitor his blood pressure and  fluid status.  He started out with a hemoglobin of 9.  He was positioned in the right lateral decubitus position with left hip up.  His primary surgery was done through an  anterior approach, but based on the extent of this operation I felt it was in his best interest from my standpoint to have this done with a posterior approach.  The left hip was then prepped and draped in sterile fashion.  A timeout was performed  identifying the patient, the planned procedure and extremity.  A posterolateral approach to the hip was made on the skin.  Soft tissue dissection was carried down to the iliotibial band and gluteal fascia.  These were then incised.  As we did this, there  was clear synovial fluid in the bursa region.  There was some evidence of some blood-tinged fluid with minor purulence in the superficial anterior aspect of the wound.  This was relatively minor and I was unable to get this cultured before was suctioned  up.  I was able to digitally palpate over to the anterior incision and did not identify any significant other fluid collections.  I then continued the dissection posteriorly.  As I exposed the posterior aspect of the hip joint, we did encounter purulent  appearing blood-tinged synovial fluid, which was aspirated and approximately 25-30 mL of this fluid was sent to pathology for evaluation.  At this point, I exposed the posterior aspect of the hip performing excisional debridement of the capsule and  nonviable tissue around this area.  Once I had the hip exposed, I was able to dislocate the hip relatively easily noting probably 5-10 degrees of anteversion of the acetabular shell.  The femoral head was removed.  With the hip in flexion, internally  rotated I spent time using thin flexible osteotomes working around the component.  When I did try to remove some prominent medial neck bone this did unfortunately fracture a little bit of the medial calcar; however, this did allow for better use of the   thin osteotomes medially.  After spending some time working around the proximal aspect of the component I used a S-ROM loop extractor and I was able to remove the femoral component without fracture or significant bone loss.  Once this was done, we  retracted the femur anteriorly.  I then exposed the acetabulum circumferentially, performing soft tissue excision.  I then removed the acetabular liner from the previously placed shell in order to remove the cancellous screw within the ilium.  Once I did  this, we replaced the liner and I used the Innomed explant hip system and was able to remove the acetabular shell with minimal bone loss.  At this point, both the femur with a canal brush irrigator and the acetabulum were irrigated with normal saline  solution with pulse lavage, 3 liters.  We then placed about 200-250 mL of Irrisept, chlorhexidine-based fluid into the wound.  While this was done, we opened up the final components.  Again, the Prostalac acetabular liner and the size 2 Summit basic  stem.  We opened up cement.  In 1 bowl we mixed 2 batches of cement with 2 grams of vancomycin and 2.4 tobramycin and the 2 other bowls we mixed a single batch of cement  with 1 gram of vancomycin and 1.2 of tobramycin per batch of cement.  Once this was  done and after about greater than 5 minutes the Irrisept was removed.  I then broached the proximal femur up to a size 4 broach with the Summit system to make certain I could get a temporary based cemented component in place.  We then mixed the cement  that I was going to use for the acetabulum.  The acetabular component was then cemented into place and held in position until the cement fully cured.  I did remove some of the cement around the edge of this.  Once this was confirmed, we mixed another  batch of cement, which I then placed around the femoral component making certain it would fit and was sized similar to the broach.  Once this was solidified we mixed the  final batch of cement and then cemented the femoral component proximally into the  femur.  I held this in position with about 20-25 degrees of anteversion until the cement cured.  At this point, I did a trial reduction with a 28 mm +1 ball.  The hip reduced nicely.  The leg lengths appeared to be relatively equal, a little bit longer as similar to the preoperative condition of the left hip; however, when I was dislocating this  construct, despite attempts at extraction with distraction and internal rotation, it dislodged the acetabular cemented component.  The cement remained on the acetabular liner and for that reason, I mixed another gram of vancomycin and tobramycin with a  batch of cement.  We reirrigated the acetabular bone stock and then placed some more cement into the acetabulum and cemented the liner with the cement into this cemented bed.  This again was held in position until the cement fully cured.  Based on the  trial reductions and the orientation of the components no further trial was carried out.  Instead, I selected the 32+1 Articul/Eze ball.  It was impacted on a clean and dried trunnion and the hip was then reduced and snapped into place into this  semi-constrained construct.  We irrigated the hip again with 3 liters normal saline solution.  Once this was done, we reapproximated the iliotibial band using #1 Vicryl and #1 Stratafix suture.  The remainder of the wound was closed with 2-0 Vicryl and a  running Monocryl.  The hip was then cleaned, dried and dressed sterilely using surgical glue and Aquacel dressing.  The patient was then brought to the recovery room in stable condition, tolerating the procedure well.  Findings were reviewed with his  family.  Postoperatively, infectious disease will be back involved.  Likely they we will restart the 6 weeks of IV antibiotics from today.  Follow up cultures and change antibiotics accordingly or appropriately.  He will be partial  weightbearing.  I will see him  back in the office in 2 weeks.  We will follow his sedimentation rate and C-reactive protein with infectious disease even off antibiotics to determine the timing for reimplantation.  CN/NUANCE  D:06/01/2019 T:06/02/2019 JOB:[id number]

## 2019-06-02 NOTE — Care Management Important Message (Signed)
Important Message  Patient Details IM Letter given to Dessa Phi RN Case Manager to present to the Patient Name: XYLAN LAMPSHIRE MRN: [id number] Date of Birth: March 21, 1929   Medicare Important Message Given:  Yes     Kerin Salen 06/02/2019, 12:00 PM

## 2019-06-02 NOTE — Progress Notes (Signed)
ANTICOAGULATION CONSULT NOTE -  Consult  Pharmacy Consult for IV heparin (note on Xarelto 10mg  daily PTA) Indication: LV thrombus  Allergies  Allergen Reactions  . Sulfa Antibiotics     unknown    Patient Measurements: Height: 5\' 6"  (167.6 cm) Weight: 76.2 kg (167 lb 15.9 oz) IBW/kg (Calculated) : 63.8  Heparin dosing weight: 74kg  Vital Signs: Temp: 97.4 F (36.3 C) (05/04 0910) Temp Source: Axillary (05/04 0910) BP: 158/68 (05/04 0910) Pulse Rate: 74 (05/04 0910)  Labs: Recent Labs    05/31/19 0527 05/31/19 1915 06/01/19 0113 06/01/19 0113 06/01/19 1716 06/01/19 1716 06/01/19 1815 06/02/19 0443  HGB 6.9*   < > 8.9*   < > 10.9*   < > 10.5* 9.2*  HCT 22.7*   < > 28.0*   < > 32.0*  --  31.0* 28.2*  PLT 282  --  283  --   --   --   --  278  HEPARINUNFRC  --   --  <0.10*  --   --   --   --   --   CREATININE 1.21  --  1.19   < > 1.00  --  1.10 1.08   < > = values in this interval not displayed.    Estimated Creatinine Clearance: 43.5 mL/min (by C-G formula based on SCr of 1.08 mg/dL).   Medical History: Past Medical History:  Diagnosis Date  . Anemia   . Arthritis    "all over" all joints  . Bladder cancer (HCC)    bladder  . Blood transfusion 2008  . Chronic kidney disease    bladder ca in 2008;Kid. failure stage 3  . Complication of anesthesia    Ilius post op  . Coronary artery disease   . CVA (cerebral vascular accident) ([place]) 03/04/2019  . Diabetes mellitus   . DJD (degenerative joint disease)   . Guaiac + stool 06/28/2017  . Heart murmur   . Hypothyroidism    hyper thyroidism recently  . Ileus ([place]) 2001   post hip replacement  . Metabolic acidosis   . Metabolic bone disease   . Myocardial infarction [facility]) age 14    Medications:  Scheduled:  . acetaminophen  500 mg Oral Q6H  . Chlorhexidine Gluconate Cloth  6 each Topical Daily  . cholecalciferol  5,000 Units Oral Daily  . docusate sodium  100 mg Oral BID  . feeding supplement (ENSURE  ENLIVE)  237 mL Oral BID BM  . fenofibrate  160 mg Oral Daily  . ferrous sulfate  325 mg Oral TID PC  . finasteride  5 mg Oral Daily  . insulin aspart  0-9 Units Subcutaneous TID WC  . insulin glargine  8 Units Subcutaneous BH-q7a  . levothyroxine  25 mcg Oral QAC breakfast  . lisinopril  2.5 mg Oral Daily  . metoprolol succinate  25 mg Oral Daily  . multivitamin with minerals  1 tablet Oral Daily  . multivitamins with iron  1 tablet Oral Daily  . omega-3 acid ethyl esters  1 capsule Oral Daily  . polyethylene glycol  17 g Oral BID  . predniSONE  40 mg Oral QAC breakfast  . sodium bicarbonate  650 mg Oral TID  . sodium chloride flush  10-40 mL Intracatheter Q12H  . tamsulosin  0.4 mg Oral QPC supper   Infusions:  . sodium chloride 100 mL/hr at 06/02/19 0805  . cefTRIAXone (ROCEPHIN)  IV 2 g (06/01/19 2202)  . methocarbamol (ROBAXIN)  IV    . vancomycin 1,000 mg (06/02/19 1232)    Assessment: 86 yo male on Xarelto 10mg  once daily for LV thrombus prior to admission that was placed on hold per urology due to hematuria. Per Md, urology states that pt with no more hematuria so safe to restart some anticoagulation. Will dose IV Heparin per pharmacy with no bolus per orders. Of note, patient also with [facility] with plan for ortho procedure tomorrow 5/3 in PM so timing of when to hold the IV heparin will need to be established tomorrow. Hgb 6.9 and Plts 282.   5/3 Heparin stopped in anticipation of procedure. To be resumed at 5/4 at 1800  Today, 06/02/19   Ok to resume heparin today  At 1800  CBC stable after procedure    Goal of Therapy:  Heparin level 0.3-0.7 units/ml Monitor platelets by anticoagulation protocol: Yes   Plan:   No IV heparin bolus  Restart at heparin drip to 1100 units/hr  Check heparin level 8 hours after start of IV heparin  Daily CBC while on heparin  Check closely for signs/sx's bleeding due to hematuria and recent ortho procedure  this admission  Royetta Asal, PharmD, BCPS 06/02/2019 1:24 PM

## 2019-06-02 NOTE — TOC Progression Note (Signed)
Transition of Care [facility]) - Progression Note    Patient Details  Name: WEBSTER LIERA MRN: [id number] Date of Birth: [birth year]  Transition of Care [facility]) CM/SW Contact  Deloras Reichard, Juliann Pulse, RN Phone Number: 06/02/2019, 11:09 AM  Clinical Narrative: POD#1 L THR. PT recc SNF. VM from health insurance on in complete approval- wanting SNF choice-informed Son Sherren Mocha of awaiting SNF choice for auth-voiced understanding.Ortho,ID also following.Not medically stable for d/c.      Expected Discharge Plan: [place] Barriers to Discharge: Continued Medical Work up  Expected Discharge Plan and Services Expected Discharge Plan: [place] In-house Referral: Clinical Social Work Discharge Planning Services: CM Consult Post Acute Care Choice: [place] Living arrangements for the past 2 months: Single Family Home                                       Social Determinants of Health (SDOH) Interventions    Readmission Risk Interventions Readmission Risk Prevention Plan 05/13/2019  Transportation Screening Complete  PCP or Specialist Appt within 5-7 Days Complete  Home Care Screening Complete  Medication Review (RN CM) Complete  Some recent data might be hidden

## 2019-06-02 NOTE — Progress Notes (Addendum)
Evening blood sugar reading of 309, no coverage.  Night coverage paged.

## 2019-06-02 NOTE — Progress Notes (Signed)
Subjective: 1 Day Post-Op Procedure(s) (LRB): TOTAL HIP ARTHROPLASTY WITH HARDWARE REMOVAL, A (Left) REIMPLANTATION OF CEMENT SPACER HIP (Left) Patient reports pain as mild.   Patient seen in rounds by Dr. Alvan Dame. Patient is well, and has had no acute complaints or problems other than mild discomfort in the left hip and a sensation of heaviness in the leg. No acute events overnight. Denies CP, SHOB, N/V. Foley catheter in place. We will start therapy today.   Objective: Vital signs in last 24 hours: Temp:  [97.5 F (36.4 C)-98.5 F (36.9 C)] 97.5 F (36.4 C) (05/04 0537) Pulse Rate:  [62-95] 67 (05/04 0537) Resp:  [10-18] 18 (05/04 0537) BP: (126-165)/(54-93) 126/69 (05/04 0537) SpO2:  [93 %-100 %] 95 % (05/04 0537) Arterial Line BP: (112-193)/(67-97) 193/67 (05/03 1930)  Intake/Output from previous day:  Intake/Output Summary (Last 24 hours) at 06/02/2019 0913 Last data filed at 06/02/2019 0620 Gross per 24 hour  Intake 4207.6 ml  Output 2410 ml  Net 1797.6 ml     Intake/Output this shift: No intake/output data recorded.  Labs: Recent Labs    05/31/19 1915 06/01/19 0113 06/01/19 1716 06/01/19 1815 06/02/19 0443  HGB 9.0* 8.9* 10.9* 10.5* 9.2*   Recent Labs    06/01/19 0113 06/01/19 1716 06/01/19 1815 06/02/19 0443  WBC 6.1  --   --  6.0  RBC 3.21*  --   --  3.22*  HCT 28.0*   < > 31.0* 28.2*  PLT 283  --   --  278   < > = values in this interval not displayed.   Recent Labs    06/01/19 0113 06/01/19 1716 06/01/19 1815 06/02/19 0443  NA 134*   < > 133* 134*  K 4.0   < > 4.9 4.6  CL 104   < > 102 106  CO2 22  --   --  20*  BUN 19   < > 17 23  CREATININE 1.19   < > 1.10 1.08  GLUCOSE 131*   < > 231* 224*  CALCIUM 9.1  --   --  9.0   < > = values in this interval not displayed.   No results for input(s): LABPT, INR in the last 72 hours.  Exam: General - Patient is Alert and Oriented Extremity - Neurologically intact Sensation intact distally  Intact pulses distally Dorsiflexion/Plantar flexion intact Dressing - dressing C/D/I Motor Function - intact, moving foot and toes well on exam.   Past Medical History:  Diagnosis Date  . Anemia   . Arthritis    "all over" all joints  . Bladder cancer (HCC)    bladder  . Blood transfusion 2008  . Chronic kidney disease    bladder ca in 2008;Kid. failure stage 3  . Complication of anesthesia    Ilius post op  . Coronary artery disease   . CVA (cerebral vascular accident) ([place]) 03/04/2019  . Diabetes mellitus   . DJD (degenerative joint disease)   . Guaiac + stool 06/28/2017  . Heart murmur   . Hypothyroidism    hyper thyroidism recently  . Ileus ([place]) 2001   post hip replacement  . Metabolic acidosis   . Metabolic bone disease   . Myocardial infarction [facility]) age 41    Assessment/Plan: 1 Day Post-Op Procedure(s) (LRB): TOTAL HIP ARTHROPLASTY WITH HARDWARE REMOVAL, A (Left) REIMPLANTATION OF CEMENT SPACER HIP (Left) Principal Problem:   Prosthetic joint infection of left hip (HCC) Active Problems:   Sepsis ([place])  Fever   Hematuria, gross   Status post hip surgery  Estimated body mass index is 27.11 kg/m as calculated from the following:   Height as of this encounter: 5\' 6"  (1.676 m).   Weight as of this encounter: 76.2 kg. Advance diet Up with therapy  DVT Prophylaxis - Foot Pumps and TED hose PWB 50% LLE Begin therapy Hip precautions discussed with patient  Hemoglobin stable at 9.2 this morning. DVT prophylaxis per medicine/urology team due to current LV apical thrombus and concurrent hematuria/ high risk of bleeding. Recommending holding for at least 24h post-operatively.   Continue PWB, up with therapy today. Continue IV antibiotics per ID. Discharge and disposition per medicine team. We appreciate the continued support in medical management of this patient.    Griffith Citron, PA-C Orthopedic Surgery [phone] 06/02/2019, 9:13 AM

## 2019-06-02 NOTE — Progress Notes (Addendum)
INFECTIOUS DISEASE PROGRESS NOTE  ID: Casey [name] is a 86 y.o. male with  Principal Problem:   Prosthetic joint infection of left hip (HCC) Active Problems:   Sepsis ([place])   Fever   Hematuria, gross   Status post hip surgery  Subjective: No complaints.   Abtx:  Anti-infectives (From admission, onward)   Start     Dose/Rate Route Frequency Ordered Stop   06/01/19 1735  vancomycin (VANCOCIN) powder  Status:  Discontinued       As needed 06/01/19 1735 06/01/19 2057   06/01/19 1735  tobramycin (NEBCIN) powder  Status:  Discontinued       As needed 06/01/19 1736 06/01/19 2057   06/01/19 1430  ceFAZolin (ANCEF) IVPB 2g/100 mL premix     2 g 200 mL/hr over 30 Minutes Intravenous On call to O.R. 06/01/19 0914 06/01/19 1611   06/01/19 1406  ceFAZolin (ANCEF) 2-4 GM/100ML-% IVPB    Note to Pharmacy: Randa Evens  : cabinet override      06/01/19 1406 06/01/19 1700   06/01/19 0600  ceFAZolin (ANCEF) IVPB 2g/100 mL premix  Status:  Discontinued     2 g 200 mL/hr over 30 Minutes Intravenous On call to O.R. 06/01/19 0413 06/01/19 0915   05/31/19 1200  vancomycin (VANCOCIN) IVPB 1000 mg/200 mL premix     1,000 mg 200 mL/hr over 60 Minutes Intravenous Every 24 hours 05/31/19 0852     05/29/19 2200  cefTRIAXone (ROCEPHIN) 2 g in sodium chloride 0.9 % 100 mL IVPB     2 g 200 mL/hr over 30 Minutes Intravenous Every 24 hours 05/29/19 1749     05/29/19 1400  ceFEPIme (MAXIPIME) 2 g in sodium chloride 0.9 % 100 mL IVPB  Status:  Discontinued     2 g 200 mL/hr over 30 Minutes Intravenous Every 24 hours 05/29/19 0152 05/29/19 1748   05/29/19 1200  vancomycin (VANCOREADY) IVPB 750 mg/150 mL  Status:  Discontinued     750 mg 150 mL/hr over 60 Minutes Intravenous Every 24 hours 05/29/19 0152 05/31/19 0852   05/29/19 0030  metroNIDAZOLE (FLAGYL) IVPB 500 mg  Status:  Discontinued     500 mg 100 mL/hr over 60 Minutes Intravenous Every 8 hours 05/29/19 0024 05/29/19 1748   05/28/19 1445   ceFEPIme (MAXIPIME) 2 g in sodium chloride 0.9 % 100 mL IVPB     2 g 200 mL/hr over 30 Minutes Intravenous  Once 05/28/19 1444 05/28/19 1635   05/28/19 1445  metroNIDAZOLE (FLAGYL) IVPB 500 mg     500 mg 100 mL/hr over 60 Minutes Intravenous  Once 05/28/19 1444 05/28/19 1635   05/28/19 1445  vancomycin (VANCOCIN) IVPB 1000 mg/200 mL premix     1,000 mg 200 mL/hr over 60 Minutes Intravenous  Once 05/28/19 1444 05/28/19 1635      Medications:  Scheduled: . acetaminophen  500 mg Oral Q6H  . Chlorhexidine Gluconate Cloth  6 each Topical Daily  . cholecalciferol  5,000 Units Oral Daily  . docusate sodium  100 mg Oral BID  . feeding supplement (ENSURE ENLIVE)  237 mL Oral BID BM  . fenofibrate  160 mg Oral Daily  . ferrous sulfate  325 mg Oral TID PC  . finasteride  5 mg Oral Daily  . insulin aspart  0-9 Units Subcutaneous TID WC  . levothyroxine  25 mcg Oral QAC breakfast  . lisinopril  2.5 mg Oral Daily  . metoprolol succinate  25 mg Oral Daily  .  multivitamin with minerals  1 tablet Oral Daily  . multivitamins with iron  1 tablet Oral Daily  . omega-3 acid ethyl esters  1 capsule Oral Daily  . polyethylene glycol  17 g Oral BID  . predniSONE  40 mg Oral QAC breakfast  . sodium bicarbonate  650 mg Oral TID  . sodium chloride flush  10-40 mL Intracatheter Q12H  . tamsulosin  0.4 mg Oral QPC supper    Objective: Vital signs in last 24 hours: Temp:  [97.4 F (36.3 C)-98.1 F (36.7 C)] 97.4 F (36.3 C) (05/04 0910) Pulse Rate:  [62-95] 74 (05/04 0910) Resp:  [10-18] 14 (05/04 0910) BP: (126-165)/(67-93) 158/68 (05/04 0910) SpO2:  [93 %-100 %] 100 % (05/04 0910) Arterial Line BP: (112-193)/(67-97) 193/67 (05/03 1930)   General appearance: alert, cooperative and no distress Resp: clear to auscultation bilaterally Cardio: regular rate and rhythm GI: normal findings: bowel sounds normal and soft, non-tender Extremities: L hip wound clean, dressed. RUE PIC.   Lab  Results Recent Labs    06/01/19 0113 06/01/19 1716 06/01/19 1815 06/02/19 0443  WBC 6.1  --   --  6.0  HGB 8.9*   < > 10.5* 9.2*  HCT 28.0*   < > 31.0* 28.2*  NA 134*   < > 133* 134*  K 4.0   < > 4.9 4.6  CL 104   < > 102 106  CO2 22  --   --  20*  BUN 19   < > 17 23  CREATININE 1.19   < > 1.10 1.08   < > = values in this interval not displayed.   Liver Panel No results for input(s): PROT, ALBUMIN, AST, ALT, ALKPHOS, BILITOT, BILIDIR, IBILI in the last 72 hours. Sedimentation Rate Recent Labs    06/01/19 0113  ESRSEDRATE 55*   C-Reactive Protein Recent Labs    06/01/19 0113  CRP 10.8*    Microbiology: Recent Results (from the past 240 hour(s))  Respiratory Panel by RT PCR (Flu A&B, Covid) - Nasopharyngeal Swab     Status: None   Collection Time: 05/28/19  1:00 PM   Specimen: Nasopharyngeal Swab  Result Value Ref Range Status   SARS Coronavirus 2 by RT PCR NEGATIVE NEGATIVE Final    Comment: (NOTE) SARS-CoV-2 target nucleic acids are NOT DETECTED. The SARS-CoV-2 RNA is generally detectable in upper respiratoy specimens during the acute phase of infection. The lowest concentration of SARS-CoV-2 viral copies this assay can detect is 131 copies/mL. A negative result does not preclude SARS-Cov-2 infection and should not be used as the sole basis for treatment or other patient management decisions. A negative result may occur with  improper specimen collection/handling, submission of specimen other than nasopharyngeal swab, presence of viral mutation(s) within the areas targeted by this assay, and inadequate number of viral copies (<131 copies/mL). A negative result must be combined with clinical observations, patient history, and epidemiological information. The expected result is Negative. Fact Sheet for Patients:  PinkCheek.be Fact Sheet for Healthcare Providers:  GravelBags.it This test is not yet ap  proved or cleared by the [place] FDA and  has been authorized for detection and/or diagnosis of SARS-CoV-2 by FDA under an Emergency Use Authorization (EUA). This EUA will remain  in effect (meaning this test can be used) for the duration of the COVID-19 declaration under Section 564(b)(1) of the Act, 21 U.S.C. section 360bbb-3(b)(1), unless the authorization is terminated or revoked sooner.    Influenza A by  PCR NEGATIVE NEGATIVE Final   Influenza B by PCR NEGATIVE NEGATIVE Final    Comment: (NOTE) The Xpert Xpress SARS-CoV-2/FLU/RSV assay is intended as an aid in  the diagnosis of influenza from Nasopharyngeal swab specimens and  should not be used as a sole basis for treatment. Nasal washings and  aspirates are unacceptable for Xpert Xpress SARS-CoV-2/FLU/RSV  testing. Fact Sheet for Patients: PinkCheek.be Fact Sheet for Healthcare Providers: GravelBags.it This test is not yet approved or cleared by the [place] FDA and  has been authorized for detection and/or diagnosis of SARS-CoV-2 by  FDA under an Emergency Use Authorization (EUA). This EUA will remain  in effect (meaning this test can be used) for the duration of the  Covid-19 declaration under Section 564(b)(1) of the Act, 21  U.S.C. section 360bbb-3(b)(1), unless the authorization is  terminated or revoked. Performed at [facility], [place] [address], [place], [place] [address]   Blood culture (routine x 2)     Status: None   Collection Time: 05/28/19  1:00 PM   Specimen: BLOOD  Result Value Ref Range Status   Specimen Description   Final    BLOOD RIGHT ANTECUBITAL Performed at [facility] [address], [place], [place] [address]    Special Requests   Final    BOTTLES DRAWN AEROBIC AND ANAEROBIC Blood Culture results may not be optimal due to an excessive volume of blood received in culture bottles Performed at  [facility] [address], [place], [place] [address]    Culture   Final    NO GROWTH 5 DAYS Performed at [facility], [place] [address] [place], [place] [address]    Report Status 06/02/2019 FINAL  Final  Blood culture (routine x 2)     Status: None   Collection Time: 05/28/19  1:20 PM   Specimen: BLOOD  Result Value Ref Range Status   Specimen Description   Final    BLOOD LEFT ANTECUBITAL Performed at [facility] [address] [place], [place] [address]    Special Requests   Final    BOTTLES DRAWN AEROBIC AND ANAEROBIC Blood Culture adequate volume Performed at [place] [address], [place], [place] [address]    Culture   Final    NO GROWTH 5 DAYS Performed at [facility], [place] [address], [place], [place] [address]    Report Status 06/02/2019 FINAL  Final  Urine culture     Status: None   Collection Time: 05/28/19  4:27 PM   Specimen: In/Out Cath Urine  Result Value Ref Range Status   Specimen Description   Final    IN/OUT CATH URINE Performed at [facility] [address] [place], [place] [address]    Special Requests   Final    NONE Performed at [facility], [place] [address] [name], [place] [address]    Culture   Final    NO GROWTH Performed at [facility], [place] [address] [place], [place] [address]    Report Status 05/29/2019 FINAL  Final  C Difficile Quick Screen w PCR reflex     Status: None   Collection Time: 05/29/19 12:34 AM   Specimen: STOOL  Result Value Ref Range Status   C Diff antigen NEGATIVE NEGATIVE Final   C Diff toxin NEGATIVE NEGATIVE Final   C Diff interpretation No C. difficile detected.  Final  Comment: Performed at [facility], [place] [address], [place], [place] [address]  Body fluid culture     Status: None   Collection Time: 05/29/19  4:29 PM   Specimen: Synovium; Body Fluid  Result Value  Ref Range Status   Specimen Description   Final    SYNOVIAL LEFT HIP Performed at [facility] [address] [place], [place] [address]    Special Requests   Final    NONE Performed at [facility], [place] [address] [place], [place] [address]    Gram Stain   Final    ABUNDANT WBC PRESENT,BOTH PMN AND MONONUCLEAR NO ORGANISMS SEEN Gram Stain Report Called to,Read Back By and Verified With: C.FRANKLIN AT 1736 ON 05/29/19 BY N.THOMPSON Performed at [facility], [place] [address], [place], [place] [address]    Culture   Final    NO GROWTH 3 DAYS Performed at [facility], [place] [address], [place], [place] [address]    Report Status 06/02/2019 FINAL  Final  Anaerobic culture     Status: None (Preliminary result)   Collection Time: 05/29/19  4:29 PM   Specimen: Synovium; Synovial Fluid  Result Value Ref Range Status   Specimen Description   Final    SYNOVIAL LEFT HIP Performed at [facility] [address], [place], [place] [address]    Special Requests   Final    NONE Performed at [facility], [place] [address] [place], [place] [address]    Gram Stain   Final    ABUNDANT WBC PRESENT,BOTH PMN AND MONONUCLEAR NO ORGANISMS SEEN Performed at [facility], [place] [address] [place], [place] [address]    Culture   Final    NO ANAEROBES ISOLATED; CULTURE IN PROGRESS FOR 5 DAYS   Report Status PENDING  Incomplete  MRSA PCR Screening     Status: None   Collection Time: 06/01/19  5:29 AM   Specimen: Nasal Mucosa; Nasopharyngeal  Result Value Ref Range Status   MRSA by PCR NEGATIVE NEGATIVE Final    Comment:        The GeneXpert MRSA Assay (FDA approved for NASAL specimens only), is one component of a comprehensive MRSA colonization surveillance program. It is not intended to diagnose MRSA infection nor to guide or monitor treatment for MRSA infections. Performed at [facility], [place] [address], [place], [place] [address]   Aerobic/Anaerobic Culture (surgical/deep wound)     Status: None (Preliminary result)   Collection Time: 06/01/19  4:47 PM   Specimen: Wound; Body Fluid  Result Value Ref Range Status   Specimen Description   Final    FLUID LEFT HIP Performed at [facility] [address] [place], [place] [address]    Special Requests IN CUP  Final   Gram Stain   Final    ABUNDANT WBC PRESENT, PREDOMINANTLY PMN NO ORGANISMS SEEN    Culture   Final    NO GROWTH < 12 HOURS Performed at [facility] [address], [place], [place] [address]    Report Status PENDING  Incomplete  Aerobic/Anaerobic Culture (surgical/deep wound)     Status: None (Preliminary result)   Collection Time: 06/01/19  4:52 PM   Specimen: Wound; Body Fluid  Result Value Ref Range Status   Specimen Description   Final    FLUID LEFT HIP Performed at [facility], [address] [place], [place] [address]    Special Requests   Final    FLUID NO. 2 Performed at [facility], [place] [address], [place], [place] [address]    Gram Stain   Final    ABUNDANT WBC PRESENT, PREDOMINANTLY PMN NO ORGANISMS SEEN    Culture   Final    NO GROWTH < 12 HOURS Performed at [facility] [address] [place], [place] [address]    Report Status PENDING  Incomplete    Studies/Results: DG Pelvis Portable  Result Date: 06/01/2019 CLINICAL DATA:  Post op hardware removal EXAM: PORTABLE PELVIS 1-2 VIEWS COMPARISON:  CT 05/29/2019, radiograph 05/28/2019 FINDINGS: Stable appearing right hip replacement. Previous left hip replacement. Interval removal of acetabular component and fixating screw. Femoral component remains in place. Moderate gas in the joint space and hip soft tissues. IMPRESSION: Removal of hardware from left acetabulum. Moderate gas in the joint space and soft tissues. Electronically Signed   By: Donavan Foil M.D.   On: 06/01/2019 19:37     Assessment/Plan: L hip Prosthetic Joint infection  Resection 5-3 with cement spacer  Prev Strep 04-2019 (GBS) DM- last A1C 7.6%    Total days of antibiotics: 5 vancio/ceftriaxone     Await his op Cx.  May be negative with recent anbx.  PIC in place Will place Opat when we have more info from Cx.  Home health should be previously in place.  Nursing and pt report decubitus ulcer from previous adm. Will ask WOC to see.  Needs better DM control.       Bobby Rumpf MD, FACP Infectious Diseases (pager) [phone] [email or web address] 06/02/2019, 10:35 AM  LOS: 5 days

## 2019-06-02 NOTE — TOC Progression Note (Signed)
Transition of Care [facility]) - Progression Note    Patient Details  Name: Casey [name] MRN: [id number] Date of Birth: 05-17-1929  [facility] CM/SW Contact  Casimiro Lienhard, Juliann Pulse, RN Phone Number: 06/02/2019, 3:38 PM  Clinical Narrative:  Received cal from dtr Melody w/SNF choice-[facility]-vm left w/[facility] rep. Will inform health insurance of choice to complete auth.    Expected Discharge Plan: [place] Barriers to Discharge: Continued Medical Work up  Expected Discharge Plan and Services Expected Discharge Plan: [place] In-house Referral: Clinical Social Work Discharge Planning Services: CM Consult Post Acute Care Choice: [place] Living arrangements for the past 2 months: Single Family Home                                       Social Determinants of Health (SDOH) Interventions    Readmission Risk Interventions Readmission Risk Prevention Plan 05/13/2019  Transportation Screening Complete  PCP or Specialist Appt within 5-7 Days Complete  Home Care Screening Complete  Medication Review (RN CM) Complete  Some recent data might be hidden

## 2019-06-02 NOTE — TOC Progression Note (Signed)
Transition of Care [facility]) - Progression Note    Patient Details  Name: Casey [name] MRN: [id number] Date of Birth: [birth year]  Transition of Care [facility]) CM/SW Contact  Kyrin Gratz, Juliann Pulse, RN Phone Number: 06/02/2019, 4:01 PM  [place] [phone] eff 5/6-5/10 per [facility] rep.       Expected Discharge Plan: [place] Barriers to Discharge: Continued Medical Work up  Expected Discharge Plan and Services Expected Discharge Plan: [place] In-house Referral: Clinical Social Work Discharge Planning Services: CM Consult Post Acute Care Choice: [place] Living arrangements for the past 2 months: Single Family Home                                       Social Determinants of Health (SDOH) Interventions    Readmission Risk Interventions Readmission Risk Prevention Plan 05/13/2019  Transportation Screening Complete  PCP or Specialist Appt within 5-7 Days Complete  Home Care Screening Complete  Medication Review (RN CM) Complete  Some recent data might be hidden

## 2019-06-03 DIAGNOSIS — Z9889 Other specified postprocedural states: Secondary | ICD-10-CM

## 2019-06-03 LAB — BASIC METABOLIC PANEL
Anion gap: 7 (ref 5–15)
BUN: 25 mg/dL — ABNORMAL HIGH (ref 8–23)
CO2: 24 mmol/L (ref 22–32)
Calcium: 9 mg/dL (ref 8.9–10.3)
Chloride: 106 mmol/L (ref 98–111)
Creatinine, Ser: 1.14 mg/dL (ref 0.61–1.24)
GFR calc Af Amer: >60 mL/min (ref >60)
GFR calc non Af Amer: 58 mL/min — ABNORMAL LOW (ref >60)
Glucose, Bld: 234 mg/dL — ABNORMAL HIGH (ref 70–99)
Potassium: 4.1 mmol/L (ref 3.5–5.1)
Sodium: 137 mmol/L (ref 135–145)

## 2019-06-03 LAB — GLUCOSE, CAPILLARY
Glucose-Capillary: 148 mg/dL — ABNORMAL HIGH (ref 70–99)
Glucose-Capillary: 218 mg/dL — ABNORMAL HIGH (ref 70–99)
Glucose-Capillary: 274 mg/dL — ABNORMAL HIGH (ref 70–99)
Glucose-Capillary: 277 mg/dL — ABNORMAL HIGH (ref 70–99)
Glucose-Capillary: 288 mg/dL — ABNORMAL HIGH (ref 70–99)

## 2019-06-03 LAB — SARS CORONAVIRUS 2 (TAT 6-24 HRS): SARS Coronavirus 2: NEGATIVE

## 2019-06-03 LAB — HEPARIN LEVEL (UNFRACTIONATED)
Heparin Unfractionated: 0.25 IU/mL — ABNORMAL LOW (ref 0.30–0.70)
Heparin Unfractionated: 0.29 IU/mL — ABNORMAL LOW (ref 0.30–0.70)
Heparin Unfractionated: 0.57 IU/mL (ref 0.30–0.70)

## 2019-06-03 LAB — VANCOMYCIN, TROUGH: Vancomycin Tr: 14 ug/mL — ABNORMAL LOW (ref 15–20)

## 2019-06-03 MED ORDER — INSULIN ASPART 100 UNIT/ML ~~LOC~~ SOLN
3.0000 [IU] | Freq: Once | SUBCUTANEOUS | Status: AC
  Administered 2019-06-03: 3 [IU] via SUBCUTANEOUS

## 2019-06-03 MED ORDER — HEPARIN (PORCINE) 25000 UT/250ML-% IV SOLN
1300.0000 [IU]/h | INTRAVENOUS | Status: DC
  Administered 2019-06-03: 1300 [IU]/h via INTRAVENOUS
  Filled 2019-06-03: qty 250

## 2019-06-03 NOTE — Progress Notes (Signed)
ANTICOAGULATION CONSULT NOTE - Follow Up Consult  Pharmacy Consult for Heparin Indication: LV Thrombus  Allergies  Allergen Reactions  . Sulfa Antibiotics     unknown    Patient Measurements: Height: 5\' 6"  (167.6 cm) Weight: 76.2 kg (167 lb 15.9 oz) IBW/kg (Calculated) : 63.8 Heparin Dosing Weight:   Vital Signs: Temp: 98.2 F (36.8 C) (05/04 2157) Temp Source: Oral (05/04 2157) BP: 137/63 (05/04 2157) Pulse Rate: 76 (05/04 2157)  Labs: Recent Labs    05/31/19 0527 05/31/19 1915 06/01/19 0113 06/01/19 0113 06/01/19 1716 06/01/19 1716 06/01/19 1815 06/02/19 0443 06/03/19 0230 06/03/19 0240  HGB 6.9*   < > 8.9*   < > 10.9*   < > 10.5* 9.2*  --   --   HCT 22.7*   < > 28.0*   < > 32.0*  --  31.0* 28.2*  --   --   PLT 282  --  283  --   --   --   --  278  --   --   HEPARINUNFRC  --   --  <0.10*  --   --   --   --   --  0.25*  --   CREATININE 1.21  --  1.19   < > 1.00   < > 1.10 1.08  --  1.14   < > = values in this interval not displayed.    Estimated Creatinine Clearance: 41.2 mL/min (by C-G formula based on SCr of 1.14 mg/dL).   Medications:  Infusions:  . sodium chloride 100 mL/hr at 06/03/19 0336  . cefTRIAXone (ROCEPHIN)  IV 2 g (06/02/19 2055)  . heparin 1,100 Units/hr (06/02/19 1823)  . methocarbamol (ROBAXIN) IV    . vancomycin 1,000 mg (06/02/19 1232)    Assessment: Patient with low heparin level.  No heparin issues per RN.  Goal of Therapy:  Heparin level 0.3-0.7 units/ml Monitor platelets by anticoagulation protocol: Yes   Plan:  Increase heparin to 1200 units/hr Recheck level at [address], [place] 06/03/2019,3:47 AM

## 2019-06-03 NOTE — Progress Notes (Addendum)
INFECTIOUS DISEASE PROGRESS NOTE  ID: Casey [name] is a 86 y.o. male with  Principal Problem:   Prosthetic joint infection of left hip (HCC) Active Problems:   Sepsis ([place])   Fever   Hematuria, gross   Status post hip surgery  Subjective: Without complaints.   Abtx:  Anti-infectives (From admission, onward)   Start     Dose/Rate Route Frequency Ordered Stop   06/01/19 1735  vancomycin (VANCOCIN) powder  Status:  Discontinued       As needed 06/01/19 1735 06/01/19 2057   06/01/19 1735  tobramycin (NEBCIN) powder  Status:  Discontinued       As needed 06/01/19 1736 06/01/19 2057   06/01/19 1430  ceFAZolin (ANCEF) IVPB 2g/100 mL premix     2 g 200 mL/hr over 30 Minutes Intravenous On call to O.R. 06/01/19 0914 06/01/19 1611   06/01/19 1406  ceFAZolin (ANCEF) 2-4 GM/100ML-% IVPB    Note to Pharmacy: Randa Evens  : cabinet override      06/01/19 1406 06/01/19 1700   06/01/19 0600  ceFAZolin (ANCEF) IVPB 2g/100 mL premix  Status:  Discontinued     2 g 200 mL/hr over 30 Minutes Intravenous On call to O.R. 06/01/19 0413 06/01/19 0915   05/31/19 1200  vancomycin (VANCOCIN) IVPB 1000 mg/200 mL premix     1,000 mg 200 mL/hr over 60 Minutes Intravenous Every 24 hours 05/31/19 0852     05/29/19 2200  cefTRIAXone (ROCEPHIN) 2 g in sodium chloride 0.9 % 100 mL IVPB     2 g 200 mL/hr over 30 Minutes Intravenous Every 24 hours 05/29/19 1749     05/29/19 1400  ceFEPIme (MAXIPIME) 2 g in sodium chloride 0.9 % 100 mL IVPB  Status:  Discontinued     2 g 200 mL/hr over 30 Minutes Intravenous Every 24 hours 05/29/19 0152 05/29/19 1748   05/29/19 1200  vancomycin (VANCOREADY) IVPB 750 mg/150 mL  Status:  Discontinued     750 mg 150 mL/hr over 60 Minutes Intravenous Every 24 hours 05/29/19 0152 05/31/19 0852   05/29/19 0030  metroNIDAZOLE (FLAGYL) IVPB 500 mg  Status:  Discontinued     500 mg 100 mL/hr over 60 Minutes Intravenous Every 8 hours 05/29/19 0024 05/29/19 1748   05/28/19  1445  ceFEPIme (MAXIPIME) 2 g in sodium chloride 0.9 % 100 mL IVPB     2 g 200 mL/hr over 30 Minutes Intravenous  Once 05/28/19 1444 05/28/19 1635   05/28/19 1445  metroNIDAZOLE (FLAGYL) IVPB 500 mg     500 mg 100 mL/hr over 60 Minutes Intravenous  Once 05/28/19 1444 05/28/19 1635   05/28/19 1445  vancomycin (VANCOCIN) IVPB 1000 mg/200 mL premix     1,000 mg 200 mL/hr over 60 Minutes Intravenous  Once 05/28/19 1444 05/28/19 1635      Medications:  Scheduled: . Chlorhexidine Gluconate Cloth  6 each Topical Daily  . cholecalciferol  5,000 Units Oral Daily  . docusate sodium  100 mg Oral BID  . feeding supplement (ENSURE ENLIVE)  237 mL Oral BID BM  . fenofibrate  160 mg Oral Daily  . ferrous sulfate  325 mg Oral TID PC  . finasteride  5 mg Oral Daily  . insulin aspart  0-9 Units Subcutaneous TID WC  . insulin glargine  8 Units Subcutaneous BH-q7a  . levothyroxine  25 mcg Oral QAC breakfast  . lisinopril  2.5 mg Oral Daily  . metoprolol succinate  25 mg Oral  Daily  . multivitamin with minerals  1 tablet Oral Daily  . multivitamins with iron  1 tablet Oral Daily  . omega-3 acid ethyl esters  1 capsule Oral Daily  . polyethylene glycol  17 g Oral BID  . predniSONE  40 mg Oral QAC breakfast  . sodium chloride flush  10-40 mL Intracatheter Q12H  . tamsulosin  0.4 mg Oral QPC supper    Objective: Vital signs in last 24 hours: Temp:  [97.6 F (36.4 C)-98.2 F (36.8 C)] 97.6 F (36.4 C) (05/05 0545) Pulse Rate:  [66-79] 66 (05/05 0545) Resp:  [13-20] 18 (05/05 0545) BP: (137-151)/(63-70) 151/70 (05/05 0545) SpO2:  [96 %-99 %] 99 % (05/05 0545) Weight:  [76.7 kg] 76.7 kg (05/05 0500)   General appearance: alert, cooperative and no distress Extremities: edema none and RUE PIC is clean.  Incision/Wound: clean, well healing.   Lab Results Recent Labs    06/01/19 0113 06/01/19 1716 06/01/19 1815 06/01/19 1815 06/02/19 0443 06/03/19 0240  WBC 6.1  --   --   --  6.0  --     HGB 8.9*   < > 10.5*  --  9.2*  --   HCT 28.0*   < > 31.0*  --  28.2*  --   NA 134*   < > 133*   < > 134* 137  K 4.0   < > 4.9   < > 4.6 4.1  CL 104   < > 102   < > 106 106  CO2 22  --   --   --  20* 24  BUN 19   < > 17   < > 23 25*  CREATININE 1.19   < > 1.10   < > 1.08 1.14   < > = values in this interval not displayed.   Liver Panel No results for input(s): PROT, ALBUMIN, AST, ALT, ALKPHOS, BILITOT, BILIDIR, IBILI in the last 72 hours. Sedimentation Rate Recent Labs    06/01/19 0113  ESRSEDRATE 55*   C-Reactive Protein Recent Labs    06/01/19 0113  CRP 10.8*    Microbiology: Recent Results (from the past 240 hour(s))  Respiratory Panel by RT PCR (Flu A&B, Covid) - Nasopharyngeal Swab     Status: None   Collection Time: 05/28/19  1:00 PM   Specimen: Nasopharyngeal Swab  Result Value Ref Range Status   SARS Coronavirus 2 by RT PCR NEGATIVE NEGATIVE Final    Comment: (NOTE) SARS-CoV-2 target nucleic acids are NOT DETECTED. The SARS-CoV-2 RNA is generally detectable in upper respiratoy specimens during the acute phase of infection. The lowest concentration of SARS-CoV-2 viral copies this assay can detect is 131 copies/mL. A negative result does not preclude SARS-Cov-2 infection and should not be used as the sole basis for treatment or other patient management decisions. A negative result may occur with  improper specimen collection/handling, submission of specimen other than nasopharyngeal swab, presence of viral mutation(s) within the areas targeted by this assay, and inadequate number of viral copies (<131 copies/mL). A negative result must be combined with clinical observations, patient history, and epidemiological information. The expected result is Negative. Fact Sheet for Patients:  PinkCheek.be Fact Sheet for Healthcare Providers:  GravelBags.it This test is not yet ap proved or cleared by the [place] FDA and  has been authorized for detection and/or diagnosis of SARS-CoV-2 by FDA under an Emergency Use Authorization (EUA). This EUA will remain  in effect (meaning this test can  be used) for the duration of the COVID-19 declaration under Section 564(b)(1) of the Act, 21 U.S.C. section 360bbb-3(b)(1), unless the authorization is terminated or revoked sooner.    Influenza A by PCR NEGATIVE NEGATIVE Final   Influenza B by PCR NEGATIVE NEGATIVE Final    Comment: (NOTE) The Xpert Xpress SARS-CoV-2/FLU/RSV assay is intended as an aid in  the diagnosis of influenza from Nasopharyngeal swab specimens and  should not be used as a sole basis for treatment. Nasal washings and  aspirates are unacceptable for Xpert Xpress SARS-CoV-2/FLU/RSV  testing. Fact Sheet for Patients: PinkCheek.be Fact Sheet for Healthcare Providers: GravelBags.it This test is not yet approved or cleared by the [place] FDA and  has been authorized for detection and/or diagnosis of SARS-CoV-2 by  FDA under an Emergency Use Authorization (EUA). This EUA will remain  in effect (meaning this test can be used) for the duration of the  Covid-19 declaration under Section 564(b)(1) of the Act, 21  U.S.C. section 360bbb-3(b)(1), unless the authorization is  terminated or revoked. Performed at [facility], [place] [address] [place], [place] [address]   Blood culture (routine x 2)     Status: None   Collection Time: 05/28/19  1:00 PM   Specimen: BLOOD  Result Value Ref Range Status   Specimen Description   Final    BLOOD RIGHT ANTECUBITAL Performed at [place] [address] [place], [place] [address]    Special Requests   Final    BOTTLES DRAWN AEROBIC AND ANAEROBIC Blood Culture results may not be optimal due to an excessive volume of blood received in culture bottles Performed at [facility] [address], [place], [place] [address]    Culture   Final    NO GROWTH 5 DAYS Performed at [facility], [place] [address], [place], [place] [address]    Report Status 06/02/2019 FINAL  Final  Blood culture (routine x 2)     Status: None   Collection Time: 05/28/19  1:20 PM   Specimen: BLOOD  Result Value Ref Range Status   Specimen Description   Final    BLOOD LEFT ANTECUBITAL Performed at [facility] [address], [place], [place] [address]    Special Requests   Final    BOTTLES DRAWN AEROBIC AND ANAEROBIC Blood Culture adequate volume Performed at [facility] [address] [place], [place] [address]    Culture   Final    NO GROWTH 5 DAYS Performed at [facility], [place] [address], [place], [place] [address]    Report Status 06/02/2019 FINAL  Final  Urine culture     Status: None   Collection Time: 05/28/19  4:27 PM   Specimen: In/Out Cath Urine  Result Value Ref Range Status   Specimen Description   Final    IN/OUT CATH URINE Performed at [facility] [address], [place], [place] [address]    Special Requests   Final    NONE Performed at [facility], [place] [address] [place], [place] [address]    Culture   Final    NO GROWTH Performed at [facility], [place] [address], [place], [place] [address]    Report Status 05/29/2019 FINAL  Final  C Difficile Quick Screen w PCR reflex     Status: None   Collection Time: 05/29/19 12:34 AM   Specimen: STOOL  Result  Value Ref Range Status   C Diff antigen NEGATIVE NEGATIVE Final   C Diff toxin NEGATIVE NEGATIVE Final   C Diff interpretation No C. difficile detected.  Final    Comment: Performed at [facility], [place] [address] [place], [place] [address]  Body fluid culture     Status: None   Collection Time: 05/29/19  4:29 PM   Specimen: Synovium; Body Fluid  Result Value Ref Range Status   Specimen  Description   Final    SYNOVIAL LEFT HIP Performed at [facility] [address], [place], [place] [address]    Special Requests   Final    NONE Performed at [facility], [place] [address], [place], [place] [address]    Gram Stain   Final    ABUNDANT WBC PRESENT,BOTH PMN AND MONONUCLEAR NO ORGANISMS SEEN Gram Stain Report Called to,Read Back By and Verified With: C.FRANKLIN AT 1736 ON 05/29/19 BY N.THOMPSON Performed at [facility], [place] [address], [place], [place] [address]    Culture   Final    NO GROWTH 3 DAYS Performed at [facility], [place] [address] [place], [place] [address]    Report Status 06/02/2019 FINAL  Final  Anaerobic culture     Status: None (Preliminary result)   Collection Time: 05/29/19  4:29 PM   Specimen: Synovium; Synovial Fluid  Result Value Ref Range Status   Specimen Description   Final    SYNOVIAL LEFT HIP Performed at [facility] [address], [place], [place] [address]    Special Requests   Final    NONE Performed at [facility], [place] [address] [place], [place] [address]    Gram Stain   Final    ABUNDANT WBC PRESENT,BOTH PMN AND MONONUCLEAR NO ORGANISMS SEEN Performed at [facility], [place] [address], [place], [place] [address]    Culture   Final    NO ANAEROBES ISOLATED; CULTURE IN PROGRESS FOR 5 DAYS   Report Status PENDING  Incomplete  MRSA PCR Screening     Status: None   Collection Time: 06/01/19  5:29 AM   Specimen: Nasal Mucosa; Nasopharyngeal  Result Value Ref Range Status   MRSA by PCR NEGATIVE NEGATIVE Final    Comment:        The GeneXpert MRSA Assay (FDA approved for NASAL specimens only), is one component of a comprehensive MRSA colonization surveillance program. It is not intended to diagnose MRSA infection nor to guide or monitor treatment for MRSA infections. Performed at [facility], [place] [address] [place], [place] [address]   Aerobic/Anaerobic Culture (surgical/deep wound)     Status: None (Preliminary result)   Collection Time: 06/01/19  4:47 PM   Specimen: Wound; Body Fluid  Result Value Ref Range Status   Specimen Description   Final    FLUID LEFT HIP Performed at [facility] [address], [place], [place] [address]    Special Requests IN CUP  Final   Gram Stain   Final    ABUNDANT WBC PRESENT, PREDOMINANTLY PMN NO ORGANISMS SEEN    Culture   Final    NO GROWTH 2 DAYS NO ANAEROBES ISOLATED; CULTURE IN PROGRESS FOR 5 DAYS Performed at [facility], [place] [address], [place], [place] [address]    Report Status PENDING  Incomplete  Aerobic/Anaerobic Culture (surgical/deep wound)     Status: None (Preliminary  result)   Collection Time: 06/01/19  4:52 PM   Specimen: Wound; Body Fluid  Result Value Ref Range Status   Specimen Description   Final    FLUID LEFT HIP Performed at [facility], [address], [place], [place] [address]    Special Requests   Final    FLUID NO. 2 Performed at [facility], [place] [address] [place], [place] [address]    Gram Stain   Final    ABUNDANT WBC PRESENT, PREDOMINANTLY PMN NO ORGANISMS SEEN    Culture   Final    NO GROWTH 2 DAYS NO ANAEROBES ISOLATED; CULTURE IN PROGRESS FOR 5 DAYS Performed at [facility] [address] [place], [place] [address]    Report Status PENDING  Incomplete    Studies/Results: DG Pelvis Portable  Result Date: 06/01/2019 CLINICAL DATA:  Post op hardware removal EXAM: PORTABLE PELVIS 1-2 VIEWS COMPARISON:  CT 05/29/2019, radiograph 05/28/2019 FINDINGS: Stable appearing right hip replacement. Previous left hip replacement. Interval removal of acetabular component and fixating screw. Femoral component remains in place. Moderate gas in the joint space and hip soft tissues. IMPRESSION: Removal of hardware from left acetabulum. Moderate gas in  the joint space and soft tissues. Electronically Signed   By: Donavan Foil M.D.   On: 06/01/2019 19:37     Assessment/Plan: L hip Prosthetic Joint infection             Resection 5-3 with cement spacer             Prev Strep 04-2019 (GBS) DM- last A1C 7.6%   Total days of antibiotics: 6 vanco, ceftriaxone  Await his Cx Could consider d/c prior to his cx being complete.  We can f/u these in ID clinic when he returns.  Blood glc on his labs remains high. Appreciate primary mgmt.          Bobby Rumpf MD, FACP Infectious Diseases (pager) [phone] [email or web address] 06/03/2019, 11:34 AM  LOS: 6 days   \

## 2019-06-03 NOTE — Progress Notes (Signed)
Pharmacy Antibiotic Note  Casey [name] is a 86 y.o. male with hx CKD, left THA in Nov 2020 and subsequently developed septic hip arthritis (s/p I&D with plan to treat with Pen G for 6 weeks for group B strep infection - 05/13/19 to 06/24/19). He  presented to the ED on 05/28/2019 with c/o fever and hypoglycemia. Left hip CT on 4/30 showed "a multilocular complex air and fluid collection within the anterior hip extending along the anterior thigh, concerning for multilocular abscess."  He underwent left thigh abscess aspiration on 4/30. Pt's currently on vancomycin and ceftriaxone for infection.  Today, 06/03/2019: - Day #7 abx - afeb, wbc wnl -  scr stable at  1.14 (crcl~35) - all cultures wit this adm. have been neg thus far - VT obtained today is 14 mcg/ml, therapeutic    Plan: - Continue vancomycin to 1000 mg IV q24h for est AUC 512 - continue ceftriaxone 2gm IV q24h - monitor renal function closely  __________________________________  Height: 5\' 6"  (167.6 cm) Weight: 76.7 kg (169 lb 1.6 oz) IBW/kg (Calculated) : 63.8  Temp (24hrs), Avg:97.8 F (36.6 C), Min:97.6 F (36.4 C), Max:98.2 F (36.8 C)  Recent Labs  Lab 05/28/19 1300 05/28/19 1300 05/29/19 0540 05/29/19 0540 05/30/19 0529 05/30/19 0529 05/31/19 0527 05/31/19 0527 06/01/19 0113 06/01/19 1716 06/01/19 1815 06/02/19 0443 06/03/19 0240 06/03/19 1234  WBC 5.4   < > 6.2  --  4.7  --  4.7  --  6.1  --   --  6.0  --   --   CREATININE 1.62*   < > 1.49*   < > 1.36*   < > 1.21   < > 1.19 1.00 1.10 1.08 1.14  --   LATICACIDVEN 1.9  --   --   --   --   --   --   --   --   --   --   --   --   --   VANCOTROUGH  --   --   --   --   --   --   --   --   --   --   --   --   --  14*   < > = values in this interval not displayed.    Estimated Creatinine Clearance: 44.6 mL/min (by C-G formula based on SCr of 1.14 mg/dL).    Allergies  Allergen Reactions  . Sulfa Antibiotics     unknown    Antimicrobials this  admission: 4/29 cefepime >> 4/30 4/29 flagyl >>4/30 4/30 CTX>> 4/29 vancomycin >>   Microbiology results: 4/10 Abscess cx: rare Streptococcus agalactia  4/29 BCx:  ngtd 4/29 UCx: NGF 4/30 C.diff:neg 4/30 abscess L hip:  4/30 left hip synovial: 5/3 MRSA PCR: negative   Thank you for allowing pharmacy to be a part of this patient's care.   Royetta Asal, PharmD, BCPS 06/03/2019 1:48 PM

## 2019-06-03 NOTE — Progress Notes (Signed)
ANTICOAGULATION CONSULT NOTE -  Consult  Pharmacy Consult for IV heparin (note on Xarelto 10mg  daily PTA) Indication: LV thrombus  Allergies  Allergen Reactions  . Sulfa Antibiotics     unknown    Patient Measurements: Height: 5\' 6"  (167.6 cm) Weight: 76.7 kg (169 lb 1.6 oz) IBW/kg (Calculated) : 63.8  Heparin dosing weight: 74kg  Vital Signs: Temp: 97.6 F (36.4 C) (05/05 1153) Temp Source: Oral (05/05 1153) BP: 146/62 (05/05 1153) Pulse Rate: 68 (05/05 1153)  Labs: Recent Labs    06/01/19 0113 06/01/19 0113 06/01/19 1716 06/01/19 1716 06/01/19 1815 06/02/19 0443 06/03/19 0230 06/03/19 0240 06/03/19 1234  HGB 8.9*   < > 10.9*   < > 10.5* 9.2*  --   --   --   HCT 28.0*   < > 32.0*  --  31.0* 28.2*  --   --   --   PLT 283  --   --   --   --  278  --   --   --   HEPARINUNFRC <0.10*  --   --   --   --   --  0.25*  --  0.29*  CREATININE 1.19   < > 1.00   < > 1.10 1.08  --  1.14  --    < > = values in this interval not displayed.    Estimated Creatinine Clearance: 44.6 mL/min (by C-G formula based on SCr of 1.14 mg/dL).   Medical History: Past Medical History:  Diagnosis Date  . Anemia   . Arthritis    "all over" all joints  . Bladder cancer (HCC)    bladder  . Blood transfusion 2008  . Chronic kidney disease    bladder ca in 2008;Kid. failure stage 3  . Complication of anesthesia    Ilius post op  . Coronary artery disease   . CVA (cerebral vascular accident) ([place]) 03/04/2019  . Diabetes mellitus   . DJD (degenerative joint disease)   . Guaiac + stool 06/28/2017  . Heart murmur   . Hypothyroidism    hyper thyroidism recently  . Ileus ([place]) 2001   post hip replacement  . Metabolic acidosis   . Metabolic bone disease   . Myocardial infarction [facility]) age 51    Medications:  Scheduled:  . Chlorhexidine Gluconate Cloth  6 each Topical Daily  . cholecalciferol  5,000 Units Oral Daily  . docusate sodium  100 mg Oral BID  . feeding supplement (ENSURE  ENLIVE)  237 mL Oral BID BM  . fenofibrate  160 mg Oral Daily  . ferrous sulfate  325 mg Oral TID PC  . finasteride  5 mg Oral Daily  . insulin aspart  0-9 Units Subcutaneous TID WC  . insulin glargine  8 Units Subcutaneous BH-q7a  . levothyroxine  25 mcg Oral QAC breakfast  . lisinopril  2.5 mg Oral Daily  . metoprolol succinate  25 mg Oral Daily  . multivitamin with minerals  1 tablet Oral Daily  . multivitamins with iron  1 tablet Oral Daily  . omega-3 acid ethyl esters  1 capsule Oral Daily  . polyethylene glycol  17 g Oral BID  . predniSONE  40 mg Oral QAC breakfast  . sodium chloride flush  10-40 mL Intracatheter Q12H  . tamsulosin  0.4 mg Oral QPC supper   Infusions:  . cefTRIAXone (ROCEPHIN)  IV 2 g (06/02/19 2055)  . heparin 1,200 Units/hr (06/03/19 0346)  . methocarbamol (ROBAXIN) IV    .  vancomycin 1,000 mg (06/03/19 1336)    Assessment: 86 yo male on Xarelto 10mg  once daily for LV thrombus prior to admission that was placed on hold per urology due to hematuria. Per Md, urology states that pt with no more hematuria so safe to restart some anticoagulation. Will dose IV Heparin per pharmacy with no bolus per orders. Of note, patient also with PJI with plan for ortho procedure tomorrow 5/3 in PM so timing of when to hold the IV heparin will need to be established tomorrow. Hgb 6.9 and Plts 282.   5/3 Heparin stopped in anticipation of procedure. To be resumed at 5/4 at 1800  Today, 06/03/19   HL is 0.29, slightly subtherapeutic   Hgb slight down to 9.2 today, plt 278  No line or bleeding issues per RN   CBC stable after procedure    Goal of Therapy:  Heparin level 0.3-0.7 units/ml Monitor platelets by anticoagulation protocol: Yes   Plan:   No IV heparin bolus  Increase heparin drip to 1300 units/hr  Check heparin level 8 hours after rate change of IV heparin  Daily CBC while on heparin  Check closely for signs/sx's bleeding due to hematuria and recent  ortho procedure  this admission  Royetta Asal, PharmD, BCPS 06/03/2019 1:40 PM

## 2019-06-03 NOTE — Progress Notes (Signed)
Inpatient Diabetes Program Recommendations  AACE/ADA: New Consensus Statement on Inpatient Glycemic Control (2015)  Target Ranges:  Prepandial:   less than 140 mg/dL      Peak postprandial:   less than 180 mg/dL (1-2 hours)      Critically ill patients:  140 - 180 mg/dL   Lab Results  Component Value Date   GLUCAP 218 (H) 06/03/2019   HGBA1C 7.6 (H) 05/10/2019    Review of Glycemic Control  Post-prandial blood sugars > 180 mg/dL.  Inpatient Diabetes Program Recommendations:    Consider adding Novolog 2 units tidwc for meal coverage insulin if pt eats > 50% meal  Continue to follow.   Thank you. Lorenda Peck, RD, LDN, CDE Inpatient Diabetes Coordinator [phone]

## 2019-06-03 NOTE — Progress Notes (Signed)
PROGRESS NOTE    Casey [name]  [id number] DOB: 05-06-1929 DOA: 05/28/2019 PCP: Glenda Chroman, MD   Brief Narrative:  89-year with history of DM2, hypothyroidism, HTN, CKD stage III, anemia underwent left hip replacement back in November 2020.  Now coming in with septic arthritis which has been recurrent over the past several weeks.  Failed outpatient treatment with 6 weeks of IV penicillin G.  Underwent another debridement on 5/3, Intra-Op cultures were obtained.  Currently culture data is pending, ID is following continue IV vancomycin and Rocephin at this time.   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  Awaiting Intra-Op cultures.  ID following -Continue IV Rocephin and vancomycin.  Gross hematuria, traumatic catheterization? History of BPH Acute blood loss anemia. -Gross hemorrhage seen in the bladder, due to obstruction Foley catheter was placed.  Urology was notified who recommended irrigation and monitoring this.  Transfuse if hemoglobin less than 7.  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  Acute kidney injury on CKD stage IIIb, POA Admission creatinine 1.6, this is trended down to 1.1.  We will continue to monitor. Discontinue sodium bicarb tablets  Hypothyroidism Continue Synthroid  Diabetes mellitus type 2, fairly labile Hemoglobin A1c 7.6.  Oral diabetic meds on hold Lantus 8 units daily.  Insulin sliding scale.  Essential hypertension -Continue lisinopril, metoprolol  Nonspecific diarrhea, improved C.  difficile negative.  On probiotics.  As needed Imodium.  Knee pain secondary to acute gouty arthritis -Improved with steroids.  Total 5-day course.  DVT prophylaxis: Heparin drip Code Status: Full code Family Communication:  Spoke with Melody  Status is: Inpatient  Remains inpatient appropriate because:Hemodynamically unstable   Dispo: The patient is from: Home              Anticipated d/c is to: Home              Anticipated d/c date is: 2 days              Patient currently is not medically stable to d/c. Cont IV Abx, awaiting IntraOp Culture data.    Subjective: Overall still has pain at surgical site but improved.  Still having hematuria.  No other new complaints.  Review of Systems Otherwise negative except as per HPI, including: General: Denies fever, chills, night sweats or unintended weight loss. Resp: Denies cough, wheezing, shortness of breath. Cardiac: Denies chest pain, palpitations, orthopnea, paroxysmal nocturnal dyspnea. GI: Denies abdominal pain, nausea, vomiting, diarrhea or constipation GU: Denies dysuria, frequency, hesitancy or incontinence MS: Denies muscle aches, joint pain or swelling Neuro: Denies headache, neurologic deficits (focal weakness, numbness, tingling), abnormal gait Psych: Denies anxiety, depression, SI/HI/AVH Skin: Denies new rashes or lesions ID: Denies sick contacts, exotic exposures, travel  Examination:  General exam: Appears calm and comfortable, elderly frail Respiratory system: Clear to auscultation. Respiratory effort normal. Cardiovascular system: S1 & S2 heard, RRR. No JVD, murmurs, rubs, gallops or clicks. No pedal edema. Gastrointestinal system: Abdomen is nondistended, soft and nontender. No organomegaly or masses felt. Normal bowel sounds heard. Central nervous system: Alert  and oriented. No focal neurological deficits. Extremities: Symmetric 5 x 5 power. Skin: Surgical site dressing noted with any evidence of active  bleeding Psychiatry: Judgement and insight appear normal. Mood & affect appropriate.   Foley catheter in place with hematuria.  Objective: Vitals:   06/02/19 1434 06/02/19 2157 06/03/19 0500 06/03/19 0545  BP: (!) 144/68 137/63  (!) 151/70  Pulse: 79 76  66  Resp: 13 20  18   Temp: 97.7 F (36.5 C) 98.2 F (36.8 C)  97.6 F (36.4 C)  TempSrc: Axillary Oral  Oral  SpO2: 99% 96%  99%  Weight:   76.7 kg   Height:        Intake/Output Summary (Last 24 hours) at 06/03/2019 1118 Last data filed at 06/03/2019 0939 Gross per 24 hour  Intake 1871.09 ml  Output 1425 ml  Net 446.09 ml   Filed Weights   05/31/19 0438 06/01/19 0500 06/03/19 0500  Weight: 74.1 kg 76.2 kg 76.7 kg     Data Reviewed:   CBC: Recent Labs  Lab 05/28/19 1300 05/28/19 1300 05/29/19 0540 05/29/19 0540 05/30/19 0529 05/30/19 1455 05/31/19 0527 05/31/19 0527 05/31/19 1915 06/01/19 0113 06/01/19 1716 06/01/19 1815 06/02/19 0443  WBC 5.4   < > 6.2  --  4.7  --  4.7  --   --  6.1  --   --  6.0  NEUTROABS 3.1  --   --   --   --   --   --   --   --   --   --   --   --   HGB 9.1*   < > 8.0*   < > 7.4*   < > 6.9*   < > 9.0* 8.9* 10.9* 10.5* 9.2*  HCT 29.5*   < > 25.8*   < > 23.9*   < > 22.7*   < > 28.2* 28.0* 32.0* 31.0* 28.2*  MCV 86.8   < > 88.4  --  87.2  --  87.3  --   --  87.2  --   --  87.6  PLT 311   < > 279  --  292  --  282  --   --  283  --   --  278   < > = values in this interval not displayed.   Basic Metabolic Panel: Recent Labs  Lab 05/30/19 0529 05/30/19 0529 05/31/19 0527 05/31/19 0527 06/01/19 0113 06/01/19 1716 06/01/19 1815 06/02/19 0443 06/03/19 0240  NA 135   < > 133*   < > 134* 133* 133* 134* 137  K 3.9   < > 3.5   < > 4.0 4.6 4.9 4.6 4.1  CL 107   < > 107   < > 104 104 102 106 106  CO2 20*  --  23  --  22  --   --  20* 24  GLUCOSE 140*   < > 143*   < > 131* 212* 231* 224* 234*  BUN 24*   < > 19   < > 19 16 17 23  25*  CREATININE 1.36*   < > 1.21   < > 1.19 1.00 1.10  1.08 1.14  CALCIUM 9.0  --  8.7*  --  9.1  --   --  9.0 9.0  MG 1.7  --  2.0  --   --   --   --   --   --    < > = values  in this interval not displayed.   GFR: Estimated Creatinine Clearance: 44.6 mL/min (by C-G formula based on SCr of 1.14 mg/dL). Liver Function Tests: Recent Labs  Lab 05/28/19 1300  AST 22  ALT 12  ALKPHOS 33*  BILITOT 0.5  PROT 6.3*  ALBUMIN 2.5*   Recent Labs  Lab 05/28/19 1300  LIPASE 64*   No results for input(s): AMMONIA in the last 168 hours. Coagulation Profile: Recent Labs  Lab 05/28/19 1300  INR 2.1*   Cardiac Enzymes: No results for input(s): CKTOTAL, CKMB, CKMBINDEX, TROPONINI in the last 168 hours. BNP (last 3 results) No results for input(s): PROBNP in the last 8760 hours. HbA1C: No results for input(s): HGBA1C in the last 72 hours. CBG: Recent Labs  Lab 06/02/19 1127 06/02/19 1656 06/02/19 2151 06/03/19 0012 06/03/19 0732  GLUCAP 258* 364* 309* 288* 148*   Lipid Profile: No results for input(s): CHOL, HDL, LDLCALC, TRIG, CHOLHDL, LDLDIRECT in the last 72 hours. Thyroid Function Tests: No results for input(s): TSH, T4TOTAL, FREET4, T3FREE, THYROIDAB in the last 72 hours. Anemia Panel: No results for input(s): VITAMINB12, FOLATE, FERRITIN, TIBC, IRON, RETICCTPCT in the last 72 hours. Sepsis Labs: Recent Labs  Lab 05/28/19 1300  LATICACIDVEN 1.9    Recent Results (from the past 240 hour(s))  Respiratory Panel by RT PCR (Flu A&B, Covid) - Nasopharyngeal Swab     Status: None   Collection Time: 05/28/19  1:00 PM   Specimen: Nasopharyngeal Swab  Result Value Ref Range Status   SARS Coronavirus 2 by RT PCR NEGATIVE NEGATIVE Final    Comment: (NOTE) SARS-CoV-2 target nucleic acids are NOT DETECTED. The SARS-CoV-2 RNA is generally detectable in upper respiratoy specimens during the acute phase of infection. The lowest concentration of SARS-CoV-2 viral copies this assay can detect is 131 copies/mL. A negative result does  not preclude SARS-Cov-2 infection and should not be used as the sole basis for treatment or other patient management decisions. A negative result may occur with  improper specimen collection/handling, submission of specimen other than nasopharyngeal swab, presence of viral mutation(s) within the areas targeted by this assay, and inadequate number of viral copies (<131 copies/mL). A negative result must be combined with clinical observations, patient history, and epidemiological information. The expected result is Negative. Fact Sheet for Patients:  PinkCheek.be Fact Sheet for Healthcare Providers:  GravelBags.it This test is not yet ap proved or cleared by the [place] FDA and  has been authorized for detection and/or diagnosis of SARS-CoV-2 by FDA under an Emergency Use Authorization (EUA). This EUA will remain  in effect (meaning this test can be used) for the duration of the COVID-19 declaration under Section 564(b)(1) of the Act, 21 U.S.C. section 360bbb-3(b)(1), unless the authorization is terminated or revoked sooner.    Influenza A by PCR NEGATIVE NEGATIVE Final   Influenza B by PCR NEGATIVE NEGATIVE Final    Comment: (NOTE) The Xpert Xpress SARS-CoV-2/FLU/RSV assay is intended as an aid in  the diagnosis of influenza from Nasopharyngeal swab specimens and  should not be used as a sole basis for treatment. Nasal washings and  aspirates are unacceptable for Xpert Xpress SARS-CoV-2/FLU/RSV  testing. Fact Sheet for Patients: PinkCheek.be Fact Sheet for Healthcare Providers: GravelBags.it This test is not yet approved or cleared by the [place] FDA and  has been authorized for detection and/or diagnosis of SARS-CoV-2 by  FDA under an Emergency Use Authorization (EUA). This EUA will remain  in effect (meaning this test can be  used) for the duration of the    Covid-19 declaration under Section 564(b)(1) of the Act, 21  U.S.C. section 360bbb-3(b)(1), unless the authorization is  terminated or revoked. Performed at [facility], [place] [address], [place], [place] [address]   Blood culture (routine x 2)     Status: None   Collection Time: 05/28/19  1:00 PM   Specimen: BLOOD  Result Value Ref Range Status   Specimen Description   Final    BLOOD RIGHT ANTECUBITAL Performed at [place] [address], [place], [place] [address]    Special Requests   Final    BOTTLES DRAWN AEROBIC AND ANAEROBIC Blood Culture results may not be optimal due to an excessive volume of blood received in culture bottles Performed at [place] [address], [place], [place] [address]    Culture   Final    NO GROWTH 5 DAYS Performed at [facility], [place] [address], [place], [place] [address]    Report Status 06/02/2019 FINAL  Final  Blood culture (routine x 2)     Status: None   Collection Time: 05/28/19  1:20 PM   Specimen: BLOOD  Result Value Ref Range Status   Specimen Description   Final    BLOOD LEFT ANTECUBITAL Performed at [place] [address] [place], [place] [address]    Special Requests   Final    BOTTLES DRAWN AEROBIC AND ANAEROBIC Blood Culture adequate volume Performed at [place] [address] [place], [place] [address]    Culture   Final    NO GROWTH 5 DAYS Performed at [facility], [place] [address] [place], [place] [address]    Report Status 06/02/2019 FINAL  Final  Urine culture     Status: None   Collection Time: 05/28/19  4:27 PM   Specimen: In/Out Cath Urine  Result Value Ref Range Status   Specimen Description   Final    IN/OUT CATH URINE Performed at [place] [address], [place], [place] [address]    Special Requests   Final    NONE Performed at [facility], [place] [address], [place], [place] [address]    Culture   Final    NO GROWTH Performed at [facility], [place] [address], [place], [place] [address]    Report Status 05/29/2019 FINAL  Final  C Difficile Quick Screen w PCR reflex     Status: None   Collection Time: 05/29/19 12:34 AM   Specimen: STOOL  Result Value Ref Range Status   C Diff antigen NEGATIVE NEGATIVE Final   C Diff toxin NEGATIVE NEGATIVE Final   C Diff interpretation No C. difficile detected.  Final    Comment: Performed at [facility], [place] [address], [place], [place] [address]  Body fluid culture     Status: None   Collection Time: 05/29/19  4:29 PM   Specimen: Synovium; Body Fluid  Result Value Ref Range Status   Specimen Description   Final    SYNOVIAL LEFT HIP Performed at [place] [address] [place], [place] [address]    Special Requests   Final    NONE Performed at [facility], [place] [address], [place], [place] [address]    Gram Stain   Final    ABUNDANT WBC PRESENT,BOTH PMN AND MONONUCLEAR NO ORGANISMS SEEN Gram  Stain Report Called to,Read Back By and Verified With: C.FRANKLIN AT 1736 ON 05/29/19 BY N.THOMPSON Performed at [facility], [place] [address] [place], [place] [address]    Culture   Final    NO GROWTH 3 DAYS Performed at [facility], [place] [address] [place], [place] [address]    Report Status 06/02/2019 FINAL  Final  Anaerobic culture     Status: None (Preliminary result)   Collection Time: 05/29/19  4:29 PM   Specimen: Synovium; Synovial Fluid  Result Value Ref Range Status   Specimen Description   Final    SYNOVIAL LEFT HIP Performed at [place] [address] [place], [place] [address]    Special Requests   Final    NONE Performed at [facility], [place] [address], [place], [place] [address]    Gram Stain   Final    ABUNDANT WBC  PRESENT,BOTH PMN AND MONONUCLEAR NO ORGANISMS SEEN Performed at [facility], [place] [address], [place], [place] [address]    Culture   Final    NO ANAEROBES ISOLATED; CULTURE IN PROGRESS FOR 5 DAYS   Report Status PENDING  Incomplete  MRSA PCR Screening     Status: None   Collection Time: 06/01/19  5:29 AM   Specimen: Nasal Mucosa; Nasopharyngeal  Result Value Ref Range Status   MRSA by PCR NEGATIVE NEGATIVE Final    Comment:        The GeneXpert MRSA Assay (FDA approved for NASAL specimens only), is one component of a comprehensive MRSA colonization surveillance program. It is not intended to diagnose MRSA infection nor to guide or monitor treatment for MRSA infections. Performed at [facility], [place] [address] [place], [place] [address]   Aerobic/Anaerobic Culture (surgical/deep wound)     Status: None (Preliminary result)   Collection Time: 06/01/19  4:47 PM   Specimen: Wound; Body Fluid  Result Value Ref Range Status   Specimen Description   Final    FLUID LEFT HIP Performed at [place] [address] [place], [place] [address]    Special Requests IN CUP  Final   Gram Stain   Final    ABUNDANT WBC PRESENT, PREDOMINANTLY PMN NO ORGANISMS SEEN    Culture   Final    NO GROWTH 2 DAYS Performed at [facility], [place] [address] [place], [place] [address]    Report Status PENDING  Incomplete  Aerobic/Anaerobic Culture (surgical/deep wound)     Status: None (Preliminary result)   Collection Time: 06/01/19  4:52 PM   Specimen: Wound; Body Fluid  Result Value Ref Range Status   Specimen Description   Final    FLUID LEFT HIP Performed at [facility], [address], [place], [place] [address]    Special Requests   Final    FLUID NO. 2 Performed at [facility], [place] [address] [place], [place] [address]    Gram Stain   Final    ABUNDANT WBC PRESENT, PREDOMINANTLY PMN NO ORGANISMS SEEN      Culture   Final    NO GROWTH 2 DAYS Performed at [facility], [place] [address] [place], [place] [address]    Report Status PENDING  Incomplete         Radiology Studies: DG Pelvis Portable  Result Date: 06/01/2019 CLINICAL DATA:  Post op hardware removal EXAM: PORTABLE PELVIS 1-2 VIEWS COMPARISON:  CT 05/29/2019, radiograph 05/28/2019 FINDINGS: Stable appearing right hip replacement. Previous left hip replacement. Interval removal of acetabular component and fixating screw. Femoral component remains in place. Moderate gas in the joint space and hip soft tissues. IMPRESSION: Removal of hardware from left acetabulum. Moderate gas in the joint space and soft tissues. Electronically Signed   By: Donavan Foil M.D.   On: 06/01/2019 19:37        Scheduled Meds: . Chlorhexidine Gluconate Cloth  6 each Topical Daily  . cholecalciferol  5,000 Units Oral Daily  . docusate sodium  100 mg Oral BID  . feeding supplement (ENSURE ENLIVE)  237 mL Oral BID BM  . fenofibrate  160 mg Oral Daily  . ferrous sulfate  325 mg Oral TID PC  . finasteride  5 mg Oral Daily  . insulin aspart  0-9 Units Subcutaneous TID WC  . insulin glargine  8 Units Subcutaneous BH-[id number]  . levothyroxine  25 mcg Oral QAC breakfast  . lisinopril  2.5 mg Oral Daily  . metoprolol succinate  25 mg Oral Daily  . multivitamin with minerals  1 tablet Oral Daily  . multivitamins with iron  1 tablet Oral Daily  . omega-3 acid ethyl esters  1 capsule Oral Daily  . polyethylene glycol  17 g Oral BID  . predniSONE  40 mg Oral QAC breakfast  . sodium bicarbonate  650 mg Oral TID  . sodium chloride flush  10-40 mL Intracatheter Q12H  . tamsulosin  0.4 mg Oral QPC supper   Continuous Infusions: . sodium chloride 100 mL/hr at 06/03/19 0336  . cefTRIAXone (ROCEPHIN)  IV 2 g (06/02/19 2055)  . heparin 1,200 Units/hr (06/03/19 0346)  . methocarbamol (ROBAXIN) IV    . vancomycin Stopped (06/03/19 1057)     LOS: 6 days    Time spent= 40 mins    Makoto Sellitto Arsenio Loader, MD [facility]  If 7PM-7AM, please contact night-coverage  06/03/2019, 11:18 AM

## 2019-06-04 ENCOUNTER — Encounter: Payer: Self-pay | Admitting: *Deleted

## 2019-06-04 DIAGNOSIS — E119 Type 2 diabetes mellitus without complications: Secondary | ICD-10-CM | POA: Diagnosis not present

## 2019-06-04 DIAGNOSIS — I10 Essential (primary) hypertension: Secondary | ICD-10-CM | POA: Diagnosis not present

## 2019-06-04 DIAGNOSIS — N183 Chronic kidney disease, stage 3 unspecified: Secondary | ICD-10-CM | POA: Diagnosis not present

## 2019-06-04 DIAGNOSIS — W1830XA Fall on same level, unspecified, initial encounter: Secondary | ICD-10-CM | POA: Diagnosis not present

## 2019-06-04 DIAGNOSIS — E612 Magnesium deficiency: Secondary | ICD-10-CM | POA: Diagnosis not present

## 2019-06-04 DIAGNOSIS — R519 Headache, unspecified: Secondary | ICD-10-CM | POA: Diagnosis not present

## 2019-06-04 DIAGNOSIS — Z743 Need for continuous supervision: Secondary | ICD-10-CM | POA: Diagnosis not present

## 2019-06-04 DIAGNOSIS — Y92531 Health care provider office as the place of occurrence of the external cause: Secondary | ICD-10-CM | POA: Diagnosis not present

## 2019-06-04 DIAGNOSIS — R600 Localized edema: Secondary | ICD-10-CM | POA: Diagnosis not present

## 2019-06-04 DIAGNOSIS — I513 Intracardiac thrombosis, not elsewhere classified: Secondary | ICD-10-CM | POA: Diagnosis not present

## 2019-06-04 DIAGNOSIS — N189 Chronic kidney disease, unspecified: Secondary | ICD-10-CM | POA: Diagnosis not present

## 2019-06-04 DIAGNOSIS — M2550 Pain in unspecified joint: Secondary | ICD-10-CM | POA: Diagnosis not present

## 2019-06-04 DIAGNOSIS — C678 Malignant neoplasm of overlapping sites of bladder: Secondary | ICD-10-CM | POA: Diagnosis not present

## 2019-06-04 DIAGNOSIS — R279 Unspecified lack of coordination: Secondary | ICD-10-CM | POA: Diagnosis not present

## 2019-06-04 DIAGNOSIS — R609 Edema, unspecified: Secondary | ICD-10-CM | POA: Diagnosis not present

## 2019-06-04 DIAGNOSIS — Z7401 Bed confinement status: Secondary | ICD-10-CM | POA: Diagnosis not present

## 2019-06-04 DIAGNOSIS — Z9889 Other specified postprocedural states: Secondary | ICD-10-CM | POA: Diagnosis not present

## 2019-06-04 DIAGNOSIS — M7989 Other specified soft tissue disorders: Secondary | ICD-10-CM | POA: Diagnosis not present

## 2019-06-04 DIAGNOSIS — T8452XD Infection and inflammatory reaction due to internal left hip prosthesis, subsequent encounter: Secondary | ICD-10-CM | POA: Diagnosis not present

## 2019-06-04 DIAGNOSIS — Z5181 Encounter for therapeutic drug level monitoring: Secondary | ICD-10-CM | POA: Diagnosis not present

## 2019-06-04 DIAGNOSIS — S161XXA Strain of muscle, fascia and tendon at neck level, initial encounter: Secondary | ICD-10-CM | POA: Diagnosis not present

## 2019-06-04 DIAGNOSIS — A419 Sepsis, unspecified organism: Secondary | ICD-10-CM | POA: Diagnosis not present

## 2019-06-04 DIAGNOSIS — Z452 Encounter for adjustment and management of vascular access device: Secondary | ICD-10-CM | POA: Diagnosis not present

## 2019-06-04 DIAGNOSIS — I4891 Unspecified atrial fibrillation: Secondary | ICD-10-CM | POA: Diagnosis not present

## 2019-06-04 DIAGNOSIS — E039 Hypothyroidism, unspecified: Secondary | ICD-10-CM | POA: Diagnosis not present

## 2019-06-04 DIAGNOSIS — S0083XA Contusion of other part of head, initial encounter: Secondary | ICD-10-CM | POA: Diagnosis not present

## 2019-06-04 DIAGNOSIS — R52 Pain, unspecified: Secondary | ICD-10-CM | POA: Diagnosis not present

## 2019-06-04 DIAGNOSIS — R601 Generalized edema: Secondary | ICD-10-CM | POA: Diagnosis not present

## 2019-06-04 DIAGNOSIS — R0902 Hypoxemia: Secondary | ICD-10-CM | POA: Diagnosis not present

## 2019-06-04 DIAGNOSIS — Z7901 Long term (current) use of anticoagulants: Secondary | ICD-10-CM | POA: Diagnosis not present

## 2019-06-04 DIAGNOSIS — R410 Disorientation, unspecified: Secondary | ICD-10-CM | POA: Diagnosis not present

## 2019-06-04 DIAGNOSIS — E118 Type 2 diabetes mellitus with unspecified complications: Secondary | ICD-10-CM | POA: Diagnosis not present

## 2019-06-04 DIAGNOSIS — Y9301 Activity, walking, marching and hiking: Secondary | ICD-10-CM | POA: Diagnosis not present

## 2019-06-04 DIAGNOSIS — R5381 Other malaise: Secondary | ICD-10-CM | POA: Diagnosis not present

## 2019-06-04 DIAGNOSIS — E7800 Pure hypercholesterolemia, unspecified: Secondary | ICD-10-CM | POA: Diagnosis not present

## 2019-06-04 DIAGNOSIS — Z79899 Other long term (current) drug therapy: Secondary | ICD-10-CM | POA: Diagnosis not present

## 2019-06-04 DIAGNOSIS — Z96649 Presence of unspecified artificial hip joint: Secondary | ICD-10-CM | POA: Diagnosis not present

## 2019-06-04 DIAGNOSIS — I491 Atrial premature depolarization: Secondary | ICD-10-CM | POA: Diagnosis not present

## 2019-06-04 DIAGNOSIS — R319 Hematuria, unspecified: Secondary | ICD-10-CM | POA: Diagnosis not present

## 2019-06-04 DIAGNOSIS — S0990XA Unspecified injury of head, initial encounter: Secondary | ICD-10-CM | POA: Diagnosis not present

## 2019-06-04 DIAGNOSIS — M009 Pyogenic arthritis, unspecified: Secondary | ICD-10-CM | POA: Diagnosis not present

## 2019-06-04 DIAGNOSIS — R0602 Shortness of breath: Secondary | ICD-10-CM | POA: Diagnosis not present

## 2019-06-04 DIAGNOSIS — D649 Anemia, unspecified: Secondary | ICD-10-CM | POA: Diagnosis not present

## 2019-06-04 DIAGNOSIS — B951 Streptococcus, group B, as the cause of diseases classified elsewhere: Secondary | ICD-10-CM | POA: Diagnosis not present

## 2019-06-04 DIAGNOSIS — Y999 Unspecified external cause status: Secondary | ICD-10-CM | POA: Diagnosis not present

## 2019-06-04 DIAGNOSIS — N390 Urinary tract infection, site not specified: Secondary | ICD-10-CM | POA: Diagnosis not present

## 2019-06-04 DIAGNOSIS — R310 Gross hematuria: Secondary | ICD-10-CM | POA: Diagnosis not present

## 2019-06-04 DIAGNOSIS — R338 Other retention of urine: Secondary | ICD-10-CM | POA: Diagnosis not present

## 2019-06-04 DIAGNOSIS — M6281 Muscle weakness (generalized): Secondary | ICD-10-CM | POA: Diagnosis not present

## 2019-06-04 LAB — BASIC METABOLIC PANEL
Anion gap: 7 (ref 5–15)
BUN: 23 mg/dL (ref 8–23)
CO2: 24 mmol/L (ref 22–32)
Calcium: 9 mg/dL (ref 8.9–10.3)
Chloride: 104 mmol/L (ref 98–111)
Creatinine, Ser: 1.08 mg/dL (ref 0.61–1.24)
GFR calc Af Amer: >60 mL/min (ref >60)
GFR calc non Af Amer: >60 mL/min (ref >60)
Glucose, Bld: 150 mg/dL — ABNORMAL HIGH (ref 70–99)
Potassium: 3.9 mmol/L (ref 3.5–5.1)
Sodium: 135 mmol/L (ref 135–145)

## 2019-06-04 LAB — CBC
HCT: 25.4 % — ABNORMAL LOW (ref 39.0–52.0)
Hemoglobin: 8 g/dL — ABNORMAL LOW (ref 13.0–17.0)
MCH: 27.9 pg (ref 26.0–34.0)
MCHC: 31.5 g/dL (ref 30.0–36.0)
MCV: 88.5 fL (ref 80.0–100.0)
Platelets: 303 10*3/uL (ref 150–400)
RBC: 2.87 MIL/uL — ABNORMAL LOW (ref 4.22–5.81)
RDW: 15.9 % — ABNORMAL HIGH (ref 11.5–15.5)
WBC: 7.5 10*3/uL (ref 4.0–10.5)
nRBC: 0 % (ref 0.0–0.2)

## 2019-06-04 LAB — ANAEROBIC CULTURE

## 2019-06-04 LAB — HEPARIN LEVEL (UNFRACTIONATED): Heparin Unfractionated: 0.78 IU/mL — ABNORMAL HIGH (ref 0.30–0.70)

## 2019-06-04 LAB — MAGNESIUM: Magnesium: 1.6 mg/dL — ABNORMAL LOW (ref 1.7–2.4)

## 2019-06-04 LAB — GLUCOSE, CAPILLARY
Glucose-Capillary: 225 mg/dL — ABNORMAL HIGH (ref 70–99)
Glucose-Capillary: 253 mg/dL — ABNORMAL HIGH (ref 70–99)

## 2019-06-04 MED ORDER — PREDNISONE 20 MG PO TABS: 40.0000 mg | ORAL_TABLET | Freq: Every day | ORAL | 0 refills | Status: AC

## 2019-06-04 MED ORDER — VANCOMYCIN HCL IN DEXTROSE 1-5 GM/200ML-% IV SOLN: 1000.0000 mg | INTRAVENOUS | Status: AC

## 2019-06-04 MED ORDER — HYDROCODONE-ACETAMINOPHEN 5-325 MG PO TABS: 1.0000 | ORAL_TABLET | Freq: Four times a day (QID) | ORAL | 0 refills | Status: AC | PRN

## 2019-06-04 MED ORDER — INSULIN GLARGINE 100 UNIT/ML ~~LOC~~ SOLN
10.0000 [IU] | Freq: Every day | SUBCUTANEOUS | Status: DC
  Administered 2019-06-04: 10 [IU] via SUBCUTANEOUS
  Filled 2019-06-04: qty 0.1

## 2019-06-04 MED ORDER — BISACODYL 10 MG RE SUPP: 10.0000 mg | Freq: Every day | RECTAL | 0 refills | Status: AC | PRN

## 2019-06-04 MED ORDER — INSULIN ASPART 100 UNIT/ML ~~LOC~~ SOLN
2.0000 [IU] | Freq: Three times a day (TID) | SUBCUTANEOUS | Status: DC
  Administered 2019-06-04 (×2): 2 [IU] via SUBCUTANEOUS

## 2019-06-04 MED ORDER — CEFTRIAXONE IV (FOR PTA / DISCHARGE USE ONLY): 2.0000 g | INTRAVENOUS | 0 refills | Status: AC

## 2019-06-04 MED ORDER — SODIUM CHLORIDE 0.9 % IV SOLN: 2.0000 g | INTRAVENOUS | Status: AC

## 2019-06-04 MED ORDER — RIVAROXABAN 10 MG PO TABS
10.0000 mg | ORAL_TABLET | Freq: Every day | ORAL | Status: DC
  Administered 2019-06-04: 10 mg via ORAL
  Filled 2019-06-04: qty 1

## 2019-06-04 MED ORDER — FERROUS SULFATE 325 (65 FE) MG PO TABS: 325.0000 mg | ORAL_TABLET | Freq: Three times a day (TID) | ORAL | 3 refills | Status: AC

## 2019-06-04 MED ORDER — VANCOMYCIN IV (FOR PTA / DISCHARGE USE ONLY)
1000.0000 mg | INTRAVENOUS | 0 refills | Status: AC
Start: 2019-06-05 — End: 2019-07-09

## 2019-06-04 MED ORDER — HEPARIN SOD (PORK) LOCK FLUSH 100 UNIT/ML IV SOLN
250.0000 [IU] | INTRAVENOUS | Status: AC | PRN
  Administered 2019-06-04: 250 [IU]

## 2019-06-04 MED ORDER — HEPARIN (PORCINE) 25000 UT/250ML-% IV SOLN
1200.0000 [IU]/h | INTRAVENOUS | Status: AC
  Administered 2019-06-04: 1200 [IU]/h via INTRAVENOUS
  Filled 2019-06-04: qty 250

## 2019-06-04 MED ORDER — POLYETHYLENE GLYCOL 3350 17 G PO PACK: 17.0000 g | PACK | Freq: Every day | ORAL | 0 refills | Status: AC | PRN

## 2019-06-04 NOTE — TOC Transition Note (Signed)
Transition of Care [facility]) - CM/SW Discharge Note   Patient Details  Name: Casey [name] MRN: [id number] Date of Birth: November 13, 1929  Transition of [facility] CM/SW Contact:  Dessa Phi, RN Phone Number: 06/04/2019, 11:23 AM   Clinical Narrative: Faxed d/c summary to [facility]-Will call PTAR once nurse ready since has to give iv abx prior d/c.      Final next level of care: Skilled Nursing Facility Barriers to Discharge: No Barriers Identified   Patient Goals and CMS Choice Patient states their goals for this hospitalization and ongoing recovery are:: will need rehab CMS Medicare.gov Compare Post Acute Care list provided to:: Patient Represenative (must comment)(daughter, Melody Knox) Choice offered to / list presented to : Adult Children, [place] / Guardian  Discharge Placement PASRR number recieved: 06/01/19            Patient [facility] bed at: [facility] Patient to be transferred to facility by: [place] Name of family member notified: Sherren Mocha son [phone] Patient and family notified of of transfer: 06/04/19  Discharge Plan and Services In-house Referral: Clinical Social Work Discharge Planning Services: CM Consult Post Acute Care Choice: [place]                               Social Determinants of Health (SDOH) Interventions     Readmission Risk Interventions Readmission Risk Prevention Plan 05/13/2019  Transportation Screening Complete  PCP or Specialist Appt within 5-7 Days Complete  Home Care Screening Complete  Medication Review (RN CM) Complete  Some recent data might be hidden

## 2019-06-04 NOTE — Progress Notes (Signed)
Report called to [facility] LPN at [facility].  All questions answered.  AVS and packet ready.  VSS.  Pt to D/C with PICC line and foley cath.

## 2019-06-04 NOTE — Progress Notes (Signed)
ANTICOAGULATION CONSULT NOTE - Follow Up Consult  Pharmacy Consult for Heparin  Indication: LV thrombus  Allergies  Allergen Reactions  . Sulfa Antibiotics     unknown    Patient Measurements: Height: 5\' 6"  (167.6 cm) Weight: 76.7 kg (169 lb 1.6 oz) IBW/kg (Calculated) : 63.8 Heparin Dosing Weight:   Vital Signs: Temp: 98.7 F (37.1 C) (05/05 2112) Temp Source: Oral (05/05 2112) BP: 155/64 (05/05 2112) Pulse Rate: 68 (05/05 2112)  Labs: Recent Labs    06/01/19 1815 06/01/19 1815 06/02/19 0443 06/03/19 0230 06/03/19 0240 06/03/19 1234 06/03/19 2300 06/04/19 0305  HGB 10.5*   < > 9.2*  --   --   --   --  8.0*  HCT 31.0*  --  28.2*  --   --   --   --  25.4*  PLT  --   --  278  --   --   --   --  303  HEPARINUNFRC  --   --   --  0.25*  --  0.29* 0.57  --   CREATININE 1.10   < > 1.08  --  1.14  --   --  1.08   < > = values in this interval not displayed.    Estimated Creatinine Clearance: 47 mL/min (by C-G formula based on SCr of 1.08 mg/dL).   Medications:  Infusions:  . cefTRIAXone (ROCEPHIN)  IV 2 g (06/03/19 2114)  . heparin 1,300 Units/hr (06/03/19 1500)  . methocarbamol (ROBAXIN) IV    . vancomycin Stopped (06/03/19 1437)    Assessment: Patient with heparin level at goal.  No heparin issues noted.  Goal of Therapy:  Heparin level 0.3-0.7 units/ml Monitor platelets by anticoagulation protocol: Yes   Plan:  Continue heparin drip at current rate Recheck level at 0800  Tyler Deis, [place] 06/04/2019,5:08 AM

## 2019-06-04 NOTE — Progress Notes (Addendum)
ANTICOAGULATION CONSULT NOTE -  Consult  Pharmacy Consult for IV heparin (note on Xarelto 10mg  daily PTA) Indication: LV thrombus  Allergies  Allergen Reactions  . Sulfa Antibiotics     unknown    Patient Measurements: Height: 5\' 6"  (167.6 cm) Weight: 77.6 kg (171 lb 1.2 oz) IBW/kg (Calculated) : 63.8  Heparin dosing weight = TBW = 77 kg  Vital Signs: Temp: 97.7 F (36.5 C) (05/06 0700) Temp Source: Oral (05/06 0700) BP: 149/62 (05/06 0700) Pulse Rate: 50 (05/06 0700)  Labs: Recent Labs    06/01/19 1815 06/01/19 1815 06/02/19 0443 06/03/19 0230 06/03/19 0240 06/03/19 1234 06/03/19 2300 06/04/19 0305 06/04/19 0803  HGB 10.5*   < > 9.2*  --   --   --   --  8.0*  --   HCT 31.0*  --  28.2*  --   --   --   --  25.4*  --   PLT  --   --  278  --   --   --   --  303  --   HEPARINUNFRC  --   --   --    < >  --  0.29* 0.57  --  0.78*  CREATININE 1.10   < > 1.08  --  1.14  --   --  1.08  --    < > = values in this interval not displayed.    Estimated Creatinine Clearance: 47.2 mL/min (by C-G formula based on SCr of 1.08 mg/dL).   Medical History: Past Medical History:  Diagnosis Date  . Anemia   . Arthritis    "all over" all joints  . Bladder cancer (HCC)    bladder  . Blood transfusion 2008  . Chronic kidney disease    bladder ca in 2008;Kid. failure stage 3  . Complication of anesthesia    Ilius post op  . Coronary artery disease   . CVA (cerebral vascular accident) ([place]) 03/04/2019  . Diabetes mellitus   . DJD (degenerative joint disease)   . Guaiac + stool 06/28/2017  . Heart murmur   . Hypothyroidism    hyper thyroidism recently  . Ileus ([place]) 2001   post hip replacement  . Metabolic acidosis   . Metabolic bone disease   . Myocardial infarction [facility] [place]) age 12    Medications: Rivaroxaban 10 mg PO daily PTA  Assessment: 86 yo male on Xarelto 10mg  once daily for LV thrombus prior to admission. Anticoagulation was initially held per urology due to  gross hematuria. On 5/2, hematuria had resolved and full dose anticoagulation was resumed with heparin infusion (pharmacy to dose with no bolus).  Significant Events: 5/3 Total hip arthroplasty with hardware removal  Today, 06/04/19   HL = 0.78 is supratherapeutic on heparin infusion of 1300 units/hr  CBC: Hgb decreased to 8; Plt WNL  Confirmed with RN that heparin infusing at correct rate. Pt continues to have some hematuria (expeceted per MD); no worsening of hematuria.   Goal of Therapy:  Heparin level 0.3-0.7 units/ml Monitor platelets by anticoagulation protocol: Yes   Plan:  No IV heparin boluses   Decrease heparin drip to 1200 units/hr  Check heparin level 8 hours after rate change of IV heparin  Daily CBC while on heparin  Check closely for signs/sx's bleeding due to hematuria and recent ortho procedure  this admission  Lenis Noon, PharmD 06/04/19 10:05 AM  Addendum:  Verbal order received to transition patient back to home xarelto.   -  Resume rivaroxaban 10 mg PO daily with lunch today -Stop heparin drip at time that rivaroxaban given  Pharmacy to sign off, please re-consult if needed.   Lenis Noon, PharmD 06/04/19 10:07 AM

## 2019-06-04 NOTE — Progress Notes (Addendum)
INFECTIOUS DISEASE PROGRESS NOTE  ID: Casey [name] is a 86 y.o. male with  Principal Problem:   Prosthetic joint infection of left hip (HCC) Active Problems:   Diabetes mellitus type 2 with complications (HCC)   Sepsis ([place])   Fever   Hematuria, gross   Status post hip surgery  Subjective: No complaints.   Abtx:  Anti-infectives (From admission, onward)   Start     Dose/Rate Route Frequency Ordered Stop   06/01/19 1735  vancomycin (VANCOCIN) powder  Status:  Discontinued       As needed 06/01/19 1735 06/01/19 2057   06/01/19 1735  tobramycin (NEBCIN) powder  Status:  Discontinued       As needed 06/01/19 1736 06/01/19 2057   06/01/19 1430  ceFAZolin (ANCEF) IVPB 2g/100 mL premix     2 g 200 mL/hr over 30 Minutes Intravenous On call to O.R. 06/01/19 0914 06/01/19 1611   06/01/19 1406  ceFAZolin (ANCEF) 2-4 GM/100ML-% IVPB    Note to Pharmacy: Randa Evens  : cabinet override      06/01/19 1406 06/01/19 1700   06/01/19 0600  ceFAZolin (ANCEF) IVPB 2g/100 mL premix  Status:  Discontinued     2 g 200 mL/hr over 30 Minutes Intravenous On call to O.R. 06/01/19 0413 06/01/19 0915   05/31/19 1200  vancomycin (VANCOCIN) IVPB 1000 mg/200 mL premix     1,000 mg 200 mL/hr over 60 Minutes Intravenous Every 24 hours 05/31/19 0852     05/29/19 2200  cefTRIAXone (ROCEPHIN) 2 g in sodium chloride 0.9 % 100 mL IVPB     2 g 200 mL/hr over 30 Minutes Intravenous Every 24 hours 05/29/19 1749     05/29/19 1400  ceFEPIme (MAXIPIME) 2 g in sodium chloride 0.9 % 100 mL IVPB  Status:  Discontinued     2 g 200 mL/hr over 30 Minutes Intravenous Every 24 hours 05/29/19 0152 05/29/19 1748   05/29/19 1200  vancomycin (VANCOREADY) IVPB 750 mg/150 mL  Status:  Discontinued     750 mg 150 mL/hr over 60 Minutes Intravenous Every 24 hours 05/29/19 0152 05/31/19 0852   05/29/19 0030  metroNIDAZOLE (FLAGYL) IVPB 500 mg  Status:  Discontinued     500 mg 100 mL/hr over 60 Minutes Intravenous Every  8 hours 05/29/19 0024 05/29/19 1748   05/28/19 1445  ceFEPIme (MAXIPIME) 2 g in sodium chloride 0.9 % 100 mL IVPB     2 g 200 mL/hr over 30 Minutes Intravenous  Once 05/28/19 1444 05/28/19 1635   05/28/19 1445  metroNIDAZOLE (FLAGYL) IVPB 500 mg     500 mg 100 mL/hr over 60 Minutes Intravenous  Once 05/28/19 1444 05/28/19 1635   05/28/19 1445  vancomycin (VANCOCIN) IVPB 1000 mg/200 mL premix     1,000 mg 200 mL/hr over 60 Minutes Intravenous  Once 05/28/19 1444 05/28/19 1635      Medications:  Scheduled: . Chlorhexidine Gluconate Cloth  6 each Topical Daily  . cholecalciferol  5,000 Units Oral Daily  . docusate sodium  100 mg Oral BID  . feeding supplement (ENSURE ENLIVE)  237 mL Oral BID BM  . fenofibrate  160 mg Oral Daily  . ferrous sulfate  325 mg Oral TID PC  . finasteride  5 mg Oral Daily  . insulin aspart  0-9 Units Subcutaneous TID WC  . insulin aspart  2 Units Subcutaneous TID WC  . insulin glargine  10 Units Subcutaneous Daily  . levothyroxine  25 mcg  Oral QAC breakfast  . lisinopril  2.5 mg Oral Daily  . metoprolol succinate  25 mg Oral Daily  . multivitamin with minerals  1 tablet Oral Daily  . multivitamins with iron  1 tablet Oral Daily  . omega-3 acid ethyl esters  1 capsule Oral Daily  . polyethylene glycol  17 g Oral BID  . predniSONE  40 mg Oral QAC breakfast  . sodium chloride flush  10-40 mL Intracatheter Q12H  . tamsulosin  0.4 mg Oral QPC supper    Objective: Vital signs in last 24 hours: Temp:  [97.6 F (36.4 C)-98.7 F (37.1 C)] 98 F (36.7 C) (05/06 0519) Pulse Rate:  [59-68] 59 (05/06 0519) Resp:  [15-20] 20 (05/06 0519) BP: (146-155)/(62-64) 154/62 (05/06 0519) SpO2:  [94 %-98 %] 98 % (05/06 0519) Weight:  [77.7 kg] 77.7 kg (05/06 0519)   General appearance: alert, cooperative and no distress Incision/Wound: L hip is clean.   Lab Results Recent Labs    06/02/19 0443 06/02/19 0443 06/03/19 0240 06/04/19 0305  WBC 6.0  --   --  7.5    HGB 9.2*  --   --  8.0*  HCT 28.2*  --   --  25.4*  NA 134*   < > 137 135  K 4.6   < > 4.1 3.9  CL 106   < > 106 104  CO2 20*   < > 24 24  BUN 23   < > 25* 23  CREATININE 1.08   < > 1.14 1.08   < > = values in this interval not displayed.   Liver Panel No results for input(s): PROT, ALBUMIN, AST, ALT, ALKPHOS, BILITOT, BILIDIR, IBILI in the last 72 hours. Sedimentation Rate No results for input(s): ESRSEDRATE in the last 72 hours. C-Reactive Protein No results for input(s): CRP in the last 72 hours.  Microbiology: Recent Results (from the past 240 hour(s))  Respiratory Panel by RT PCR (Flu A&B, Covid) - Nasopharyngeal Swab     Status: None   Collection Time: 05/28/19  1:00 PM   Specimen: Nasopharyngeal Swab  Result Value Ref Range Status   SARS Coronavirus 2 by RT PCR NEGATIVE NEGATIVE Final    Comment: (NOTE) SARS-CoV-2 target nucleic acids are NOT DETECTED. The SARS-CoV-2 RNA is generally detectable in upper respiratoy specimens during the acute phase of infection. The lowest concentration of SARS-CoV-2 viral copies this assay can detect is 131 copies/mL. A negative result does not preclude SARS-Cov-2 infection and should not be used as the sole basis for treatment or other patient management decisions. A negative result may occur with  improper specimen collection/handling, submission of specimen other than nasopharyngeal swab, presence of viral mutation(s) within the areas targeted by this assay, and inadequate number of viral copies (<131 copies/mL). A negative result must be combined with clinical observations, patient history, and epidemiological information. The expected result is Negative. Fact Sheet for Patients:  PinkCheek.be Fact Sheet for Healthcare Providers:  GravelBags.it This test is not yet ap proved or cleared by the [place] FDA and  has been authorized for detection and/or diagnosis of  SARS-CoV-2 by FDA under an Emergency Use Authorization (EUA). This EUA will remain  in effect (meaning this test can be used) for the duration of the COVID-19 declaration under Section 564(b)(1) of the Act, 21 U.S.C. section 360bbb-3(b)(1), unless the authorization is terminated or revoked sooner.    Influenza A by PCR NEGATIVE NEGATIVE Final   Influenza B by PCR  NEGATIVE NEGATIVE Final    Comment: (NOTE) The Xpert Xpress SARS-CoV-2/FLU/RSV assay is intended as an aid in  the diagnosis of influenza from Nasopharyngeal swab specimens and  should not be used as a sole basis for treatment. Nasal washings and  aspirates are unacceptable for Xpert Xpress SARS-CoV-2/FLU/RSV  testing. Fact Sheet for Patients: PinkCheek.be Fact Sheet for Healthcare Providers: GravelBags.it This test is not yet approved or cleared by the [place] FDA and  has been authorized for detection and/or diagnosis of SARS-CoV-2 by  FDA under an Emergency Use Authorization (EUA). This EUA will remain  in effect (meaning this test can be used) for the duration of the  Covid-19 declaration under Section 564(b)(1) of the Act, 21  U.S.C. section 360bbb-3(b)(1), unless the authorization is  terminated or revoked. Performed at [facility], [place] [address] [place], [place] [address]   Blood culture (routine x 2)     Status: None   Collection Time: 05/28/19  1:00 PM   Specimen: BLOOD  Result Value Ref Range Status   Specimen Description   Final    BLOOD RIGHT ANTECUBITAL Performed at [place] [address], [place], [place] [address]    Special Requests   Final    BOTTLES DRAWN AEROBIC AND ANAEROBIC Blood Culture results may not be optimal due to an excessive volume of blood received in culture bottles Performed at [place] [address], [place], [place] [address]    Culture   Final     NO GROWTH 5 DAYS Performed at [facility], [place] [address], [place], [place] [address]    Report Status 06/02/2019 FINAL  Final  Blood culture (routine x 2)     Status: None   Collection Time: 05/28/19  1:20 PM   Specimen: BLOOD  Result Value Ref Range Status   Specimen Description   Final    BLOOD LEFT ANTECUBITAL Performed at [place] [address] [place], [place] [address]    Special Requests   Final    BOTTLES DRAWN AEROBIC AND ANAEROBIC Blood Culture adequate volume Performed at [place] [address], [place], [place] [address]    Culture   Final    NO GROWTH 5 DAYS Performed at [facility], [place] [address] [place], [place] [address]    Report Status 06/02/2019 FINAL  Final  Urine culture     Status: None   Collection Time: 05/28/19  4:27 PM   Specimen: In/Out Cath Urine  Result Value Ref Range Status   Specimen Description   Final    IN/OUT CATH URINE Performed at [place] [address], [place], [place] [address]    Special Requests   Final    NONE Performed at [facility], [place] [address], [place], [place] [address]    Culture   Final    NO GROWTH Performed at [facility], [place] [address], [place], [place] [address]    Report Status 05/29/2019 FINAL  Final  C Difficile Quick Screen w PCR reflex     Status: None   Collection Time: 05/29/19 12:34 AM   Specimen: STOOL  Result Value Ref Range Status   C Diff antigen NEGATIVE NEGATIVE Final   C Diff toxin NEGATIVE NEGATIVE Final   C Diff interpretation No C. difficile detected.  Final    Comment: Performed at [facility], [place]  [address] [place] [address], [place], [place] [address]  Body fluid culture     Status: None   Collection Time: 05/29/19  4:29 PM   Specimen: Synovium; Body Fluid  Result Value Ref Range Status   Specimen Description   Final    SYNOVIAL LEFT HIP Performed at [place] [address], [place], [place] [address]    Special Requests   Final    NONE Performed at [facility], [place] [address] [place], [place] [address]    Gram Stain   Final    ABUNDANT WBC PRESENT,BOTH PMN AND MONONUCLEAR NO ORGANISMS SEEN Gram Stain Report Called to,Read Back By and Verified With: C.FRANKLIN AT 1736 ON 05/29/19 BY N.THOMPSON Performed at [facility], [place] [address], [place], [place] [address]    Culture   Final    NO GROWTH 3 DAYS Performed at [facility], [place] [address] [place], [place] [address]    Report Status 06/02/2019 FINAL  Final  Anaerobic culture     Status: None (Preliminary result)   Collection Time: 05/29/19  4:29 PM   Specimen: Synovium; Synovial Fluid  Result Value Ref Range Status   Specimen Description   Final    SYNOVIAL LEFT HIP Performed at [place] [address], [place], [place] [address]    Special Requests   Final    NONE Performed at [facility], [place] [address], [place], [place] [address]    Gram Stain   Final    ABUNDANT WBC PRESENT,BOTH PMN AND MONONUCLEAR NO ORGANISMS SEEN Performed at [facility], [place] [address], [place], [place] [address]    Culture   Final    NO ANAEROBES ISOLATED; CULTURE IN PROGRESS FOR 5 DAYS   Report Status PENDING  Incomplete  MRSA PCR Screening     Status: None   Collection Time: 06/01/19  5:29 AM   Specimen: Nasal Mucosa; Nasopharyngeal  Result Value Ref Range Status   MRSA by PCR NEGATIVE NEGATIVE Final    Comment:        The GeneXpert MRSA Assay (FDA approved for NASAL specimens only), is one component of a comprehensive MRSA colonization surveillance program. It is not intended to diagnose MRSA infection nor to guide or monitor treatment for MRSA infections. Performed at [facility], [place] [address] [place], [place] [address]   Aerobic/Anaerobic Culture  (surgical/deep wound)     Status: None (Preliminary result)   Collection Time: 06/01/19  4:47 PM   Specimen: Wound; Body Fluid  Result Value Ref Range Status   Specimen Description   Final    FLUID LEFT HIP Performed at [place] [address], [place], [place] [address]    Special Requests IN CUP  Final   Gram Stain   Final    ABUNDANT WBC PRESENT, PREDOMINANTLY PMN NO ORGANISMS SEEN    Culture   Final    NO GROWTH 2 DAYS NO ANAEROBES ISOLATED; CULTURE IN PROGRESS FOR 5 DAYS Performed at [facility], [place] [address], [place], [place] [address]    Report Status PENDING  Incomplete  Aerobic/Anaerobic Culture (surgical/deep wound)     Status: None (Preliminary result)   Collection Time: 06/01/19  4:52 PM   Specimen: Wound; Body Fluid  Result Value Ref Range Status   Specimen Description   Final    FLUID LEFT HIP Performed at [facility], [address] [place], [place] [address]    Special Requests   Final    FLUID NO. 2 Performed at [facility], [place] [address] [place], [place] [address]    Gram Stain   Final    ABUNDANT WBC PRESENT, PREDOMINANTLY PMN NO ORGANISMS SEEN    Culture   Final    NO GROWTH 2 DAYS NO ANAEROBES ISOLATED; CULTURE IN PROGRESS FOR 5 DAYS Performed at [place] [address] [place], [place] [address]    Report Status PENDING  Incomplete  SARS CORONAVIRUS 2 (TAT 6-24 HRS) Nasopharyngeal Nasopharyngeal Swab     Status: None   Collection Time: 06/03/19  5:00 AM   Specimen: Nasopharyngeal Swab  Result Value Ref Range Status   SARS Coronavirus 2 NEGATIVE NEGATIVE Final    Comment: (NOTE) SARS-CoV-2 target nucleic acids are NOT DETECTED. The SARS-CoV-2 RNA is generally detectable in upper and lower respiratory specimens during the acute phase of infection. Negative results do not preclude SARS-CoV-2 infection, do not rule out co-infections with other pathogens, and should not be used  as the sole basis for treatment or other patient management decisions. Negative results must be combined with clinical observations, patient history, and epidemiological information. The expected result is Negative. Fact Sheet for Patients: SugarRoll.be Fact Sheet for Healthcare Providers: https://www.woods-mathews.com/ This test is not yet approved or cleared by the [place] FDA and  has been authorized for detection and/or diagnosis of SARS-CoV-2 by FDA under an Emergency Use Authorization (EUA). This EUA will remain  in effect (meaning this test can be used) for the duration of the COVID-19 declaration under Section 56 4(b)(1) of the Act, 21 U.S.C. section 360bbb-3(b)(1), unless the authorization is terminated or revoked sooner. Performed at [facility], [place] [address], [place], [place] [address]     Studies/Results: No results found.   Assessment/Plan: L hip Prosthetic Joint infection Resection 5-3 with cement spacer Prev Strep 04-2019 (GBS) DM- last A1C 7.6%  Total days of antibiotics: 7 vanco, ceftriaxone  Continue to await Cx.   FSG 277-148. On steroids. Appreciate primary f/u.     He has f/u appt with Dr Linus Salmons 5-17 at 2pm.  Can f/u his Cx at outpt if he is d/c prior to Cx being final (they may be - due to his prior anbx)    Bobby Rumpf MD, FACP Infectious Diseases (pager) [phone] [email or web address] 06/04/2019, 6:49 AM  LOS: 7 days    Allergies  Allergen Reactions  . Sulfa Antibiotics     unknown    OPAT Orders Discharge antibiotics to be given via PICC line Discharge antibiotics: ceftraixone 1g IVPB Per pharmacy protocol vancomycin Aim for Vancomycin trough 15-20 or AUC 400-550 (unless otherwise indicated) Duration: 35 days End Date: July 09, 2019  [place] Per Protocol: please  Home health RN for IV administration and teaching; PICC line care and labs.     Labs weekly while on IV antibiotics: _x_ CBC with differential __ BMP _x_ CMP _x_ CRP _x_ ESR _x_ Vancomycin trough __ CK  _x_ Please pull PIC at completion of IV antibiotics __ Please leave PIC in place until doctor has seen patient or been notified  Fax weekly labs to [phone]  Clinic Follow Up Appt: Dr Linus Salmons 06-15-19 at 2pm

## 2019-06-04 NOTE — TOC Transition Note (Signed)
Transition of Care [facility]) - CM/SW Discharge Note   Patient Details  Name: Casey [name] MRN: [id number] Date of Birth: [birth year]  Transition [facility]) CM/SW Contact:  Dessa Phi, RN Phone Number: 06/04/2019, 10:32 AM   Clinical Narrative:d/c today to [facility]-rep [place] abx,f/c care,picc. Will send d/c summary,going to rm #[phone],nurse call report# [phone]. Will call PTAR once nurse ready.      Final next level of care: Skilled Nursing Facility Barriers to Discharge: No Barriers Identified   Patient Goals and CMS Choice Patient states their goals for this hospitalization and ongoing recovery are:: will need rehab CMS Medicare.gov Compare Post Acute Care list provided to:: Patient Represenative (must comment)(daughter, Melody Knox) Choice offered to / list presented to : Adult Children, [place] / Guardian  Discharge Placement PASRR number recieved: 06/01/19            Patient [facility] Patient to be transferred to facility by: [place] Name of family member notified: Sherren Mocha son [phone] Patient and family notified of of transfer: 06/04/19  Discharge Plan and Services In-house Referral: Clinical Social Work Discharge Planning Services: CM Consult Post Acute Care Choice: [place]                               Social Determinants of Health (SDOH) Interventions     Readmission Risk Interventions Readmission Risk Prevention Plan 05/13/2019  Transportation Screening Complete  PCP or Specialist Appt within 5-7 Days Complete  Home Care Screening Complete  Medication Review (RN CM) Complete  Some recent data might be hidden

## 2019-06-04 NOTE — Progress Notes (Signed)
     Subjective: 3 Days Post-Op Procedure(s) (LRB): TOTAL HIP ARTHROPLASTY WITH HARDWARE REMOVAL, A (Left) REIMPLANTATION OF CEMENT SPACER HIP (Left)   Patient reports pain as mild, pain controlled with APAP.  Discussed the PWB status.  Discussed his current situation and expectation moving forward. Will have him follow up in 2 weeks.      Objective:   VITALS:   Vitals:   06/03/19 2112 06/04/19 0519  BP: (!) 155/64 (!) 154/62  Pulse: 68 (!) 59  Resp: 18 20  Temp: 98.7 F (37.1 C) 98 F (36.7 C)  SpO2: 94% 98%    Dorsiflexion/Plantar flexion intact Incision: dressing C/D/I Compartment soft  LABS Recent Labs    06/01/19 1815 06/02/19 0443 06/04/19 0305  HGB 10.5* 9.2* 8.0*  HCT 31.0* 28.2* 25.4*  WBC  --  6.0 7.5  PLT  --  278 303    Recent Labs    06/02/19 0443 06/03/19 0240 06/04/19 0305  NA 134* 137 135  K 4.6 4.1 3.9  BUN 23 25* 23  CREATININE 1.08 1.14 1.08  GLUCOSE 224* 234* 150*     Assessment/Plan: 3 Days Post-Op Procedure(s) (LRB): TOTAL HIP ARTHROPLASTY WITH HARDWARE REMOVAL, A (Left) REIMPLANTATION OF CEMENT SPACER HIP (Left)   Up with therapy Discharge to SNF  Follow up in 2 weeks at [facility] Follow up with OLIN,Saesha Llerenas D in 2 weeks.  Contact information:  [facility] [address] [place] [id number]      Ortho recommendations:  Xarelto for anticoagulation, unless other medically indicated.  APAP for pain management.  MiraLax and Colace for constipation  Iron 325 mg tid for 2-3 weeks   PWB 50% on the left leg.  Dressing to remain in place until follow in clinic in 2 weeks.  Dressing is waterproof and may shower with it in place.  Follow up in 2 weeks at [facility]. Follow up with OLIN,Yisrael Obryan D in 2 weeks.  Contact information:  [facility] [address], [address] [place] [id number]           Danae Orleans PA-C  [facility]   [place] [address] [place], [place], [place] [address] Phone: [phone] [email or web address] Facebook  [employer]

## 2019-06-04 NOTE — TOC Transition Note (Signed)
Transition of Care [facility]) - CM/SW Discharge Note   Patient Details  Name: Casey [name] MRN: [id number] Date of Birth: 11-May-1929  Transition of Care [facility]) CM/SW Contact:  Dessa Phi, RN Phone Number: 06/04/2019, 2:35 PM   Clinical Narrative:  PTAR called for 4p pick up-Nsg agreed. No further CM needs.     Final next level of care: Skilled Nursing Facility Barriers to Discharge: No Barriers Identified   Patient Goals and CMS Choice Patient states their goals for this hospitalization and ongoing recovery are:: will need rehab CMS Medicare.gov Compare Post Acute Care list provided to:: Patient Represenative (must comment)(daughter, Melody Knox) Choice offered to / list presented to : Adult Children, [place] / Guardian  Discharge Placement PASRR number recieved: 06/01/19            Patient chooses bed at: [facility] Patient to be transferred to facility by: [place] Name of family member notified: Sherren Mocha son [phone] Patient and family notified of of transfer: 06/04/19  Discharge Plan and Services In-house Referral: Clinical Social Work Discharge Planning Services: CM Consult Post Acute Care Choice: [place]                               Social Determinants of Health (SDOH) Interventions     Readmission Risk Interventions Readmission Risk Prevention Plan 05/13/2019  Transportation Screening Complete  PCP or Specialist Appt within 5-7 Days Complete  Home Care Screening Complete  Medication Review (RN CM) Complete  Some recent data might be hidden

## 2019-06-04 NOTE — Evaluation (Signed)
Occupational Therapy Evaluation Patient Details Name: Casey [name] MRN: [id number] DOB: 06/27/1929 Today's Date: 06/04/2019    History of Present Illness Casey [name] is a 86 y.o. male with medical history significant of diabetes mellitus, hypothyroidism, hypertension, chronic kidney disease stage III, anemia, stroke, Post Direct anterior left THA in 11/20. Patient was admitted to [facility] on 05/09/19 for concerns of septic hip arthritis and  infection and possible Lt thigh abcess. Pt is now s/p I&D to Lt hip on 05/12/19. [name] s/p removal LTHA and placement of antibiotic spacer left hip.   Clinical Impression   Mr. Casey [name] is an 86 year old man who presents with decreased ROM and strength of LLE, PWB status and posterior hip precautions, generalized weakness and decreased activity resulting in decreased ability to perform ADLs and mobility. Patient will benefit from skilled OT services to improve deficits and learn compensatory strategies as needed in order to return to PLOF. Patient will benefit from short term rehab at discharge.    Follow Up Recommendations  SNF    Equipment Recommendations       Recommendations for Other Services       Precautions / Restrictions Precautions Precautions: Fall;Posterior Hip Precaution Booklet Issued: No Restrictions Weight Bearing Restrictions: Yes LLE Weight Bearing: Partial weight bearing LLE Partial Weight Bearing Percentage or Pounds: 50%      Mobility Bed Mobility Overal bed mobility: Needs Assistance Bed Mobility: Supine to Sit     Supine to sit: Min assist     General bed mobility comments: assist with LLE, extra time, cues for posterior hip precautions  Transfers Overall transfer level: Needs assistance Equipment used: Rolling walker (2 wheeled)   Sit to Stand: Min assist Stand pivot transfers: Min assist       General transfer comment: MIn assist to ambulate short distance in room with RW and then transfer into  recliner. verbal cues for patient to not get tied up in IV lines.    Balance   Sitting-balance support: No upper extremity supported;Feet supported Sitting balance-Leahy Scale: Good     Standing balance support: Bilateral upper extremity supported Standing balance-Leahy Scale: Poor                             ADL either performed or assessed with clinical judgement   ADL Overall ADL's : Needs assistance/impaired Eating/Feeding: Independent   Grooming: Wash/dry hands;Wash/dry face;Oral care;Brushing hair;Set up Grooming Details (indicate cue type and reason): Patient performed grooming tasks seated in recliner with set up. Upper Body Bathing: Sitting;Set up   Lower Body Bathing: Moderate assistance   Upper Body Dressing : Set up   Lower Body Dressing: Maximal assistance   Toilet Transfer: Minimal assistance   Toileting- Clothing Manipulation and Hygiene: Minimal assistance   Tub/ Shower Transfer: Minimal assistance   Functional mobility during ADLs: Rolling walker;Minimal assistance       Vision         Perception     Praxis      Pertinent Vitals/Pain Pain Score: 2  Pain Location: L hip Pain Descriptors / Indicators: Aching Pain Intervention(s): Monitored during session     Hand Dominance Right   Extremity/Trunk Assessment Upper Extremity Assessment Upper Extremity Assessment: Generalized weakness;Overall [facility] for tasks assessed   Lower Extremity Assessment Lower Extremity Assessment: Defer to PT evaluation   Cervical / Trunk Assessment Cervical / Trunk Assessment: Kyphotic   Communication Communication Communication: No difficulties  Cognition Arousal/Alertness: Awake/alert Behavior During Therapy: WFL for tasks assessed/performed Overall Cognitive Status: Within Functional Limits for tasks assessed                                     General Comments       Exercises     Shoulder Instructions      Home Living  Family/patient expects to be discharged to:: Skilled nursing facility Living Arrangements: Alone   Type of Home: House Home Access: Stairs to enter Entrance Stairs-Number of Steps: 1   Home Layout: One level     Bathroom Shower/Tub: Occupational psychologist: Handicapped height Bathroom Accessibility: Yes   Home Equipment: Grab bars - tub/shower;Grab bars - toilet;Walker - 2 wheels;Walker - 4 wheels;Bedside commode;Cane - single point          Prior Functioning/Environment Level of Independence: Independent with assistive device(s)        Comments: pt using rollator and 2 wheeled Rwr for mobility        OT Problem List: Decreased strength;Decreased activity tolerance;Impaired balance (sitting and/or standing);Pain;Decreased knowledge of precautions      OT Treatment/Interventions: Self-care/ADL training;Therapeutic exercise;Therapeutic activities    OT Goals(Current goals can be found in the care plan section) Acute Rehab OT Goals Patient Stated Goal: To cook again OT Goal Formulation: With patient Time For Goal Achievement: 06/18/19 Potential to Achieve Goals: Good  OT Frequency: Min 2X/week   Barriers to D/C:            Co-evaluation              AM-PAC OT "6 Clicks" Daily Activity     Outcome Measure Help from another person eating meals?: None Help from another person taking care of personal grooming?: A Little Help from another person toileting, which includes using toliet, bedpan, or urinal?: A Little Help from another person bathing (including washing, rinsing, drying)?: A Lot Help from another person to put on and taking off regular upper body clothing?: A Little Help from another person to put on and taking off regular lower body clothing?: A Lot 6 Click Score: 17   End of Session Equipment Utilized During Treatment: Gait belt;Rolling walker Nurse Communication: Mobility status  Activity Tolerance: Patient tolerated treatment well;No  increased pain Patient left: in chair;with chair alarm set  OT Visit Diagnosis: Other abnormalities of gait and mobility (R26.89);Muscle weakness (generalized) (M62.81);Pain Pain - Right/Left: Left Pain - part of body: Hip                Time: [id number] OT Time Calculation (min): 28 min Charges:  OT General Charges $OT Visit: 1 Visit OT Evaluation $OT Eval Moderate Complexity: 1 Mod OT Treatments $Self Care/Home Management : 8-22 mins  Derl Barrow, OTR/L Acute Care Rehab Services  Office [phone]   Lenward Chancellor 06/04/2019, 12:50 PM

## 2019-06-04 NOTE — Progress Notes (Signed)
PHARMACY CONSULT NOTE FOR:  OUTPATIENT  PARENTERAL ANTIBIOTIC THERAPY (OPAT)  Indication: Prosthetic joint infection Regimen: Ceftriaxone 2 g IV q24h, vancomycin 1000 mg IV q24h End date: 07/09/19  IV antibiotic discharge orders are pended. To discharging provider:  please sign these orders via discharge navigator,  Select New Orders & click on the button choice - Manage This Unsigned Work.    Thank you for allowing pharmacy to be a part of this patient's care.  Lenis Noon, PharmD 06/04/2019, 10:14 AM

## 2019-06-04 NOTE — Discharge Summary (Signed)
Physician Discharge Summary  Casey [name] BHA:[id number] DOB: 13-Mar-1929 DOA: 05/28/2019  PCP: Glenda Chroman, MD  Admit date: 05/28/2019 Discharge date: 06/04/2019  Admitted From: Home Disposition: SNF  Recommendations for Outpatient Follow-up:  1. Follow up with PCP in 1-2 weeks 2. Check BMP/CBC in next 3-4 days.  Rest of the lab work ordered per OPAT -weekly CBC, CMP, CRP, ESR, vancomycin trough.  Fax weekly labs to [phone]. 3. Oral prednisone for 2 more days 4. IV Rocephin and vancomycin as prescribed until July 09, 2019.  Follow-up outpatient with infectious disease, Dr. Ewell Poe on 06/15/2019.  Maintain PICC line until IV antibiotics have been discontinued 5. Check blood sugars 4 times daily, 3 times before meal and one time before bedtime 6. Iron supplements with bowel regimen 7. Maintain Foley catheter for hematuria and obstruction.  Follow-up outpatient with urology in 1 week. 8. Follow-up outpatient orthopedic per their recommendations-2 weeks 9. Partial weightbearing.   Discharge Condition: Stable CODE STATUS: Full Diet recommendation: Heart healthy/diabetic  Brief/Interim Summary: 89-year with history of DM2, hypothyroidism, HTN, CKD stage III, anemia underwent left hip replacement back in November 2020.  Now coming in with septic arthritis which has been recurrent over the past several weeks.  Failed outpatient treatment with 6 weeks of IV penicillin G.  Underwent another debridement on 5/3, Intra-Op cultures were obtained.    Culture data thus far negative, ID recommending discharging patient on vancomycin and Rocephin.  Cultures will be followed up by their service and further adjustments will be made.  As of right now patient has a PICC line in place, continue antibiotics with weekly labs until 07/09/2019.  Rest of the instructions as mentioned above.  I have discussed patient's care extensively with the daughter Melody.   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    Intra-Op cultures remain negative, seen by ID -ID recommending continuing IV Rocephin and vancomycin with weekly labs until 07/09/2019.  Outpatient infectious disease follow-up on 06/15/2019.  Gross hematuria, traumatic catheterization? History of BPH Acute blood loss anemia. -Hematuria persists but slightly improved.  Foley catheter in place, placed by urology.  Outpatient follow-up in 1 week.  If hemoglobin drops less than 7.0, transfuse as appropriate.  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  Acute kidney injury on CKD stage IIIb, POA More or less around baseline of 1.0.  Hypothyroidism Continue Synthroid  Diabetes mellitus type 2, fairly labile Hemoglobin A1c 7.6. Resume home meds.  Check blood glucose 4 times daily, 3 times before meals and one time before bedtime  Essential hypertension -Continue lisinopril, metoprolol  Nonspecific diarrhea, improved C. difficile negative.  On probiotics.  As needed Imodium.  Knee pain secondary to acute gouty arthritis -Improved with steroids.  Total 5-day course.  2 more days of prednisone left.   Discharge Diagnoses:  Principal Problem:   Prosthetic joint infection of left hip ([place]) Active Problems:   Diabetes mellitus type 2 with complications (HCC)   Sepsis ([place])   Fever   Hematuria, gross   Status post hip surgery    Consultations:  Orthopedic  Infectious disease  Subjective: Feels okay still has some hematuria but no new complaints.  Overall tells me he feels well  better.  Discharge Exam: Vitals:   06/04/19 0519 06/04/19 0700  BP: (!) 154/62 (!) 149/62  Pulse: (!) 59 (!) 50  Resp: 20 18  Temp: 98 F (36.7 C) 97.7 F (36.5 C)  SpO2: 98% 93%   Vitals:   06/03/19 1153 06/03/19 2112 06/04/19 0519 06/04/19 0700  BP: (!) 146/62 (!) 155/64 (!) 154/62 (!) 149/62  Pulse: 68 68 (!) 59 (!) 50  Resp: '15 18 20 18  ' Temp: 97.6 F (36.4 C) 98.7 F (37.1 C) 98 F (36.7 C) 97.7 F (36.5 C)  TempSrc: Oral Oral Oral Oral  SpO2: 97% 94% 98% 93%  Weight:   77.7 kg 77.6 kg  Height:        General: Pt is alert, awake, not in acute distress, frail. Cardiovascular: RRR, S1/S2 +, no rubs, no gallops Respiratory: CTA bilaterally, no wheezing, no rhonchi Abdominal: Soft, NT, ND, bowel sounds + Extremities: no edema, no cyanosis Left hip dressing in place without any evidence of active bleeding.  Foley catheter in place with some hematuria.  Discharge Instructions  Discharge Instructions    [facility] pharmacist to adjust dose for Vancomycin, Aminoglycosides and other anti-infective therapies as requested by physician.   Complete by: As directed    [facility] to provide Cath Flo 41m   Complete by: As directed    Administer for PICC line occlusion and as ordered by physician for other access device issues.   Anaphylaxis Kit: Provided to treat any anaphylactic reaction to the medication being provided to the patient if First Dose or when requested by physician   Complete by: As directed    Epinephrine 148mml vial / amp: Administer 0.52m20m0.52ml41mubcutaneously once for moderate to severe anaphylaxis, nurse to call physician and pharmacy when reaction occurs and call 911 if needed for immediate care   Diphenhydramine 50mg47mIV vial: Administer 25-50mg 68mM PRN for first dose reaction, rash, itching, mild reaction, nurse to call physician and pharmacy when reaction occurs   Sodium Chloride 0.9% NS 500ml I48mdminister if needed for  hypovolemic blood pressure drop or as ordered by physician after call to physician with anaphylactic reaction   Change dressing on IV access line weekly and PRN   Complete by: As directed    Face-to-face encounter (required for Medicare/Medicaid patients)   Complete by: As directed    I Logan JRayna Sextony that this patient is under my care and that I, or a nurse practitioner or physician's assistant working with me, had a face-to-face encounter that meets the physician face-to-face encounter requirements with this patient on 05/28/2019. The encounter with the patient was in whole, or in part for the following medical condition(s) which is the primary reason for home health care (List medical condition): indolent fevers, weakness, recent left hip surgery   The encounter with the patient was in whole, or in part, for the following medical condition, which is the primary reason for home health care: indolent fevers; recent left hip surgery   I certify that, based on my findings, the following services are medically necessary home health services:  Physical therapy Nursing     Reason for Medically Necessary Home Health Services: Skilled Nursing- Post-Surgical Wound Assessment and Care   My clinical findings support the need for the above services: OTHER SEE COMMENTS   Further, I certify that my clinical findings support that  this patient is homebound due to: Ambulates short distances less than 300 feet   Flush IV access with Sodium Chloride 0.9% and Heparin 10 units/ml or 100 units/ml   Complete by: As directed    Home Health   Complete by: As directed    To provide the following care/treatments:  PT RN [place] infusion instructions - [facility]   Complete by: As directed    Instructions: Flush IV access with Sodium Chloride 0.9% and Heparin 10units/ml or 100units/ml   Change dressing on IV access line: Weekly and PRN   Instructions Cath Flo 67m: Administer for  PICC Line occlusion and as ordered by physician for other access device   [facility] pharmacist to adjust dose for: Vancomycin, Aminoglycosides and other anti-infective therapies as requested by physician   Method of administration may be changed at the discretion of home infusion pharmacist based upon assessment of the patient and/or caregiver's ability to self-administer the medication ordered   Complete by: As directed      Allergies as of 06/04/2019      Reactions   Sulfa Antibiotics    unknown      Medication List    STOP taking these medications   acetaminophen 500 MG tablet Commonly known as: TYLENOL   penicillin G  IVPB     TAKE these medications   baclofen 10 MG tablet Commonly known as: LIORESAL Take 1 tablet (10 mg total) by mouth 2 (two) times daily.   bisacodyl 10 MG suppository Commonly known as: DULCOLAX Place 1 suppository (10 mg total) rectally daily as needed for moderate constipation.   cefTRIAXone  IVPB Commonly known as: ROCEPHIN Inject 2 g into the vein daily. Indication:  Prosthetic joint infection First Dose: Yes Last Day of Therapy:  07/09/19 Labs - Once weekly:  CBC/D and BMP, Labs - Every other week:  ESR and CRP Method of administration: IV Push Method of administration may be changed at the discretion of home infusion pharmacist based upon assessment of the patient and/or caregiver's ability to self-administer the medication ordered. Start taking on: Jun 05, 2019   cefTRIAXone 2 g in sodium chloride 0.9 % 100 mL Inject 2 g into the vein daily.   docusate sodium 100 MG capsule Commonly known as: Colace Take 1 capsule (100 mg total) by mouth 2 (two) times daily. To prevent constipation while taking pain medication.   Fenofibric Acid 135 MG Cpdr Take 135 mg by mouth daily.   ferrous sulfate 325 (65 FE) MG tablet Take 1 tablet (325 mg total) by mouth 3 (three) times daily after meals.   finasteride 5 MG tablet Commonly known as:  PROSCAR Take 5 mg by mouth daily.   Fish Oil 1000 MG Caps Take 1 capsule by mouth daily.   Flomax 0.4 MG Caps capsule Generic drug: tamsulosin Take 0.4 mg by mouth in the morning and at bedtime.   GENTLE IRON PO Take 1 capsule by mouth daily.   glimepiride 4 MG tablet Commonly known as: AMARYL Take 4 mg by mouth in the morning and at bedtime. Prescribed twice daily, patient may take once daily based on his BS level.   HYDROcodone-acetaminophen 5-325 MG tablet Commonly known as: Norco Take 1-2 tablets by mouth every 6 (six) hours as needed for severe pain (Use tylenol for mild and moderate pain.).   levothyroxine 25 MCG tablet Commonly known as: SYNTHROID Take 25 mcg by mouth daily before breakfast.  lisinopril 2.5 MG tablet Commonly known as: ZESTRIL Take 2.5 mg by mouth daily.   loratadine 10 MG tablet Commonly known as: CLARITIN Take 10 mg by mouth daily as needed for allergies.   methocarbamol 500 MG tablet Commonly known as: ROBAXIN Take 1 tablet (500 mg total) by mouth every 6 (six) hours as needed for muscle spasms.   metoprolol succinate 25 MG 24 hr tablet Commonly known as: TOPROL-XL Take 25 mg by mouth daily.   multivitamin tablet Take 1 tablet by mouth daily.   ondansetron 4 MG tablet Commonly known as: Zofran Take 1 tablet (4 mg total) by mouth every 8 (eight) hours as needed for nausea or vomiting.   polyethylene glycol 17 g packet Commonly known as: MIRALAX / GLYCOLAX Take 17 g by mouth daily as needed for moderate constipation or severe constipation.   predniSONE 20 MG tablet Commonly known as: DELTASONE Take 2 tablets (40 mg total) by mouth daily before breakfast for 2 days. Start taking on: Jun 05, 2019   rivaroxaban 10 MG Tabs tablet Commonly known as: XARELTO Take 10 mg by mouth daily.   sodium bicarbonate 650 MG tablet Take 650 mg by mouth 3 (three) times daily.   vancomycin  IVPB Inject 1,000 mg into the vein daily. Indication:   Prosthetic joint infection First Dose: No Last Day of Therapy:  07/09/19 Labs - "Sunday/Monday:  CBC/D, BMP, and vancomycin trough. Labs - Thursday:  BMP and vancomycin trough Labs - Every other week:  ESR and CRP Method of administration:Elastomeric Method of administration may be changed at the discretion of the patient and/or caregiver's ability to self-administer the medication ordered. Start taking on: Jun 05, 2019   vancomycin 1-5 GM/200ML-% Soln Commonly known as: VANCOCIN Inject 200 mLs (1,000 mg total) into the vein daily.   Vitamin D3 125 MCG (5000 UT) Caps Take 1 capsule by mouth daily.            Discharge Care Instructions  (From admission, onward)         Start     Ordered   06/04/19 0000  Change dressing on IV access line weekly and PRN  (Home infusion instructions - [facility] )     05" /06/21 1039          Contact information for follow-up providers    Paralee Cancel, MD. Schedule an appointment as soon as possible for a visit in 2 weeks.   Specialty: Orthopedic Surgery Contact information: [address] [place] [address] [phone]        Glenda Chroman, MD. Schedule an appointment as soon as possible for a visit in 1 week(s).   Specialty: Internal Medicine Contact information: [place] [address] [phone]        Nancee Liter, MD. Schedule an appointment as soon as possible for a visit in 1 week(s).   Specialty: Urology Why: Foley and hematuria care.  Contact information: [address] [place]  [address] [phone]            Contact information for after-discharge care    Destination    [facility] .   Service: Skilled Nursing Contact information: [address] [place] [phone]                 Allergies  Allergen Reactions  . Sulfa Antibiotics     unknown    You were  cared for by a  hospitalist during your hospital stay. If you have any questions about your discharge medications or the care you received while you were in the hospital after you are discharged, you can call the unit and asked to speak with the hospitalist on call if the hospitalist that took care of you is not available. Once you are discharged, your primary care physician will handle any further medical issues. Please note that no refills for any discharge medications will be authorized once you are discharged, as it is imperative that you return to your primary care physician (or establish a relationship with a primary care physician if you do not have one) for your aftercare needs so that they can reassess your need for medications and monitor your lab values.   Procedures/Studies: CT ABDOMEN PELVIS WO CONTRAST  Result Date: 05/29/2019 CLINICAL DATA:  Hematuria EXAM: CT ABDOMEN AND PELVIS WITHOUT CONTRAST TECHNIQUE: Multidetector CT imaging of the abdomen and pelvis was performed following the standard protocol without IV contrast. COMPARISON:  None FINDINGS: Lower chest: Trace pleural effusions and bibasilar atelectasis. Hepatobiliary: No focal liver abnormality is seen. Status post cholecystectomy. No biliary dilatation. Pancreas: Unremarkable. Spleen: Unremarkable. Adrenals/Urinary Tract: Hyperdensity within the bladder likely reflecting hemorrhage. Air is present secondary to Foley catheter placement. Portions of the bladder are obscured by artifact. Left greater than right renal cortical scarring. No hydronephrosis. Stomach/Bowel: Small hiatal hernia. Stomach is otherwise unremarkable. Bowel is normal in caliber. Vascular/Lymphatic: Aortic atherosclerosis. No enlarged abdominal or pelvic lymph nodes. Reproductive: Obscured by artifact Other: No ascites. Musculoskeletal: Partially imaged collection of the anterior left hip better evaluated on concurrent dedicated imaging. Degenerative changes of the lumbar spine.  Bilateral total hip arthroplasties. IMPRESSION: Moderate hemorrhage within the bladder.  Foley catheter is present. Electronically Signed   By: Macy Mis M.D.   On: 05/29/2019 11:55   DG Pelvis Portable  Result Date: 06/01/2019 CLINICAL DATA:  Post op hardware removal EXAM: PORTABLE PELVIS 1-2 VIEWS COMPARISON:  CT 05/29/2019, radiograph 05/28/2019 FINDINGS: Stable appearing right hip replacement. Previous left hip replacement. Interval removal of acetabular component and fixating screw. Femoral component remains in place. Moderate gas in the joint space and hip soft tissues. IMPRESSION: Removal of hardware from left acetabulum. Moderate gas in the joint space and soft tissues. Electronically Signed   By: Donavan Foil M.D.   On: 06/01/2019 19:37   CT HIP LEFT WO CONTRAST  Result Date: 05/29/2019 CLINICAL DATA:  Hip replacement, question of infection, high fevers EXAM: CT OF THE LEFT HIP WITHOUT CONTRAST TECHNIQUE: Multidetector CT imaging of the left hip was performed according to the standard protocol. Multiplanar CT image reconstructions were also generated. COMPARISON:  May 28, 2019 radiograph, CT May 08, 2019 FINDINGS: Bones/Joint/Cartilage The patient is status post left total hip arthroplasty. No periprosthetic lucency or fracture is seen. No hip joint effusion is noted. The femoral head component is well seated within the acetabular component. Ligaments Suboptimally assessed by CT. Muscles and Tendons Head are within or adjacent to the tensor fascia lata there is a multilocular fluid collection containing foci of air measuring approximately 4.7 x 5.3 by 9.7 cm extending along the anterolateral leg. There is mild fatty atrophy of the remainder of the muscles surrounding the hip. Limited visualization of the tendons is seen. The previously seen multilocular collection along the posterior hip is no longer identified. Soft tissues There appears to be a hyperdense material layering in the posterior  bladder which could represent layering blood  products. The bladder appears to be markedly distended. Surgical suture seen along the lower left anterior inguinal wall. There is subcutaneous edema along the lateral aspect of the hip. IMPRESSION: 1. There remains a multilocular complex air and fluid collection within the anterior hip measuring approximately 4.7 x 5.3 x 9.7 cm extending along the anterior thigh, concerning for multilocular abscess. 2. Interval resolution of the posterior multilocular collection seen on prior CT of 4 9 2021. 3. Hyperdense probable blood products within the posterior bladder with a markedly distended bladder. Electronically Signed   By: Prudencio Pair M.D.   On: 05/29/2019 02:21   DG Chest Portable 1 View  Result Date: 05/28/2019 CLINICAL DATA:  Fever. EXAM: PORTABLE CHEST 1 VIEW COMPARISON:  None. FINDINGS: The heart size and mediastinal contours are within normal limits. Both lungs are clear. The visualized skeletal structures are unremarkable. IMPRESSION: No active disease. Electronically Signed   By: Marijo Conception M.D.   On: 05/28/2019 13:22   DG C-Arm 1-60 Min-No Report  Result Date: 05/12/2019 Fluoroscopy was utilized by the requesting physician.  No radiographic interpretation.   [place] FNA SOFT TISSUE  Result Date: 05/10/2019 INDICATION: Multiloculated fluid collections with enhancing walls extend from the left hip arthroplasty and hip joint into the surrounding soft tissues most consistent with infected collections/abscesses and in keeping with infected arthroplasty.  Aspiration requested EXAM: ULTRASOUND GUIDED ASPIRATION  OF LEFT THIGH COLLECTION MEDICATIONS: Lidocaine 1% subcutaneous ANESTHESIA/SEDATION: None required PROCEDURE: The procedure, risks, benefits, and alternatives were explained to the patient. Questions regarding the procedure were encouraged and answered. The patient understands and consents to the procedure. Survey ultrasound of the anterior proximal  left thigh was performed and the dominant collection was localized. An appropriate skin entry site was determined and marked. The operative field was prepped with chlorhexidine in a sterile fashion, and a sterile drape was applied covering the operative field. A sterile gown and sterile gloves were used for the procedure. Local anesthesia was provided with 1% Lidocaine. 7 cm multi sidehole Yueh sheath needle advanced into the collection. 185 mL opaque beige thin fluid were aspirated, sent for Gram stain and culture. The patient tolerated the procedure well. COMPLICATIONS: None immediate. FINDINGS: Hypoechoic anterior proximal left thigh deep subcutaneous collection localized, measuring approximately 11.2 x 4 x 7.6 cm. 185 mL fluid aspirated as above. No regional visible fluid post aspiration. IMPRESSION: 1. Technically successful ultrasound-guided aspiration, anterior left thigh collection. Electronically Signed   By: Lucrezia Europe M.D.   On: 05/10/2019 08:27   DG HIP OPERATIVE UNILAT W OR W/O PELVIS LEFT  Result Date: 05/12/2019 CLINICAL DATA:  Left hip intraoperative evaluation EXAM: OPERATIVE left HIP (WITH PELVIS IF PERFORMED) 2 VIEWS TECHNIQUE: Fluoroscopic spot image(s) were submitted for interpretation post-operatively. COMPARISON:  05/08/2019 FINDINGS: Similar appearance of left hip arthroplasty. No change in orientation of femoral and acetabular components. No acute bone abnormality. Two intraoperative views are submitted. Fluoroscopy time: 1 second IMPRESSION: Left hip arthroplasty with similar anatomic alignment. Electronically Signed   By: Zetta Bills M.D.   On: 05/12/2019 12:40   DG Hip Unilat W or Wo Pelvis 2-3 Views Left  Result Date: 05/28/2019 CLINICAL DATA:  Postoperative left hip infection. EXAM: DG HIP (WITH OR WITHOUT PELVIS) 2-3V LEFT COMPARISON:  Fluoroscopic images of May 12, 2019. FINDINGS: The left femoral and acetabular components appear to be well situated. No fracture or  dislocation is noted. Gas is noted in the surrounding soft tissues concerning for postoperative change or possibly  infection. IMPRESSION: Status post left total hip arthroplasty. Gas is noted in surrounding soft tissues which may represent postoperative change, but infection cannot be excluded. Electronically Signed   By: Marijo Conception M.D.   On: 05/28/2019 13:23   [place] EKG SITE RITE  Result Date: 05/11/2019 If Site Rite image not attached, placement could not be confirmed due to current cardiac rhythm.     The results of significant diagnostics from this hospitalization (including imaging, microbiology, ancillary and laboratory) are listed below for reference.     Microbiology: Recent Results (from the past 240 hour(s))  Respiratory Panel by RT PCR (Flu A&B, Covid) - Nasopharyngeal Swab     Status: None   Collection Time: 05/28/19  1:00 PM   Specimen: Nasopharyngeal Swab  Result Value Ref Range Status   SARS Coronavirus 2 by RT PCR NEGATIVE NEGATIVE Final    Comment: (NOTE) SARS-CoV-2 target nucleic acids are NOT DETECTED. The SARS-CoV-2 RNA is generally detectable in upper respiratoy specimens during the acute phase of infection. The lowest concentration of SARS-CoV-2 viral copies this assay can detect is 131 copies/mL. A negative result does not preclude SARS-Cov-2 infection and should not be used as the sole basis for treatment or other patient management decisions. A negative result may occur with  improper specimen collection/handling, submission of specimen other than nasopharyngeal swab, presence of viral mutation(s) within the areas targeted by this assay, and inadequate number of viral copies (<131 copies/mL). A negative result must be combined with clinical observations, patient history, and epidemiological information. The expected result is Negative. Fact Sheet for Patients:  PinkCheek.be Fact Sheet for Healthcare Providers:   GravelBags.it This test is not yet ap proved or cleared by the [place] FDA and  has been authorized for detection and/or diagnosis of SARS-CoV-2 by FDA under an Emergency Use Authorization (EUA). This EUA will remain  in effect (meaning this test can be used) for the duration of the COVID-19 declaration under Section 564(b)(1) of the Act, 21 U.S.C. section 360bbb-3(b)(1), unless the authorization is terminated or revoked sooner.    Influenza A by PCR NEGATIVE NEGATIVE Final   Influenza B by PCR NEGATIVE NEGATIVE Final    Comment: (NOTE) The Xpert Xpress SARS-CoV-2/FLU/RSV assay is intended as an aid in  the diagnosis of influenza from Nasopharyngeal swab specimens and  should not be used as a sole basis for treatment. Nasal washings and  aspirates are unacceptable for Xpert Xpress SARS-CoV-2/FLU/RSV  testing. Fact Sheet for Patients: PinkCheek.be Fact Sheet for Healthcare Providers: GravelBags.it This test is not yet approved or cleared by the [place] FDA and  has been authorized for detection and/or diagnosis of SARS-CoV-2 by  FDA under an Emergency Use Authorization (EUA). This EUA will remain  in effect (meaning this test can be used) for the duration of the  Covid-19 declaration under Section 564(b)(1) of the Act, 21  U.S.C. section 360bbb-3(b)(1), unless the authorization is  terminated or revoked. Performed at [facility], [place] [address], [place], [place] [address]   Blood culture (routine x 2)     Status: None   Collection Time: 05/28/19  1:00 PM   Specimen: BLOOD  Result Value Ref Range Status   Specimen Description   Final    BLOOD RIGHT ANTECUBITAL Performed at [facility] [address], [place],  [address]    Special Requests   Final    BOTTLES DRAWN AEROBIC AND ANAEROBIC Blood Culture results may not be  optimal due to  an excessive volume of blood received in culture bottles Performed at [facility] [address] [place], [place] [address]    Culture   Final    NO GROWTH 5 DAYS Performed at [facility], [place] [address] [place], [place] [address]    Report Status 06/02/2019 FINAL  Final  Blood culture (routine x 2)     Status: None   Collection Time: 05/28/19  1:20 PM   Specimen: BLOOD  Result Value Ref Range Status   Specimen Description   Final    BLOOD LEFT ANTECUBITAL Performed at [facility] [address], [place], [place] [address]    Special Requests   Final    BOTTLES DRAWN AEROBIC AND ANAEROBIC Blood Culture adequate volume Performed at [place] [address] [place], [place] [address]    Culture   Final    NO GROWTH 5 DAYS Performed at [facility], [place] [address] [place], [place] [address]    Report Status 06/02/2019 FINAL  Final  Urine culture     Status: None   Collection Time: 05/28/19  4:27 PM   Specimen: In/Out Cath Urine  Result Value Ref Range Status   Specimen Description   Final    IN/OUT CATH URINE Performed at [facility] [address] [place], [place] [address]    Special Requests   Final    NONE Performed at [facility], [place] [address], [place], [place] [address]    Culture   Final    NO GROWTH Performed at [facility], [place] [address], [place], [place] [address]    Report Status 05/29/2019 FINAL  Final  C Difficile Quick Screen w PCR reflex     Status: None   Collection Time: 05/29/19 12:34 AM   Specimen: STOOL  Result Value Ref Range Status   C Diff antigen NEGATIVE NEGATIVE Final   C Diff toxin NEGATIVE NEGATIVE Final   C Diff interpretation No C. difficile detected.  Final    Comment: Performed at [facility], [place] [address] [place], [place] [address]  Body fluid culture     Status: None   Collection  Time: 05/29/19  4:29 PM   Specimen: Synovium; Body Fluid  Result Value Ref Range Status   Specimen Description   Final    SYNOVIAL LEFT HIP Performed at [facility] [address] [place], [place] [address]    Special Requests   Final    NONE Performed at [facility], [place] [address] [place], [place] [address]    Gram Stain   Final    ABUNDANT WBC PRESENT,BOTH PMN AND MONONUCLEAR NO ORGANISMS SEEN Gram Stain Report Called to,Read Back By and Verified With: C.FRANKLIN AT 1736 ON 05/29/19 BY N.THOMPSON Performed at [facility], [place] [address], [place], [place] [address]    Culture   Final    NO GROWTH 3 DAYS Performed at [facility], [place] [address] [place], [place] [address]    Report Status 06/02/2019 FINAL  Final  Anaerobic culture     Status: None   Collection Time: 05/29/19  4:29 PM   Specimen: Synovium; Synovial Fluid  Result Value Ref Range Status   Specimen Description   Final    SYNOVIAL LEFT HIP Performed at [place] [address] [place],  [address]  Special Requests   Final    NONE Performed at [facility], [place] [address] [place] [address], [place], [place] [address]    Gram Stain   Final    ABUNDANT WBC PRESENT,BOTH PMN AND MONONUCLEAR NO ORGANISMS SEEN    Culture   Final    NO ANAEROBES ISOLATED Performed at [facility], [place] [address] [place], [place] [address]    Report Status 06/04/2019 FINAL  Final  MRSA PCR Screening     Status: None   Collection Time: 06/01/19  5:29 AM   Specimen: Nasal Mucosa; Nasopharyngeal  Result Value Ref Range Status   MRSA by PCR NEGATIVE NEGATIVE Final    Comment:        The GeneXpert MRSA Assay (FDA approved for NASAL specimens only), is one component of a comprehensive MRSA colonization surveillance program. It is not intended to diagnose MRSA infection nor to guide or monitor treatment for MRSA  infections. Performed at [facility], [place] [address], [place], [place] [address]   Aerobic/Anaerobic Culture (surgical/deep wound)     Status: None (Preliminary result)   Collection Time: 06/01/19  4:47 PM   Specimen: Wound; Body Fluid  Result Value Ref Range Status   Specimen Description   Final    FLUID LEFT HIP Performed at [facility] [address], [place], [place] [address]    Special Requests IN CUP  Final   Gram Stain   Final    ABUNDANT WBC PRESENT, PREDOMINANTLY PMN NO ORGANISMS SEEN    Culture   Final    NO GROWTH 3 DAYS NO ANAEROBES ISOLATED; CULTURE IN PROGRESS FOR 5 DAYS Performed at [place] [address], [place], [place] [address]    Report Status PENDING  Incomplete  Aerobic/Anaerobic Culture (surgical/deep wound)     Status: None (Preliminary result)   Collection Time: 06/01/19  4:52 PM   Specimen: Wound; Body Fluid  Result Value Ref Range Status   Specimen Description   Final    FLUID LEFT HIP Performed at [facility], [address] [place] [address], [place], [place] [address]    Special Requests   Final    FLUID NO. 2 Performed at [facility], [place] [address] [place], [place] [address]    Gram Stain   Final    ABUNDANT WBC PRESENT, PREDOMINANTLY PMN NO ORGANISMS SEEN    Culture   Final    NO GROWTH 3 DAYS NO ANAEROBES ISOLATED; CULTURE IN PROGRESS FOR 5 DAYS Performed at [facility] [address] [place], [place] [address]    Report Status PENDING  Incomplete  SARS CORONAVIRUS 2 (TAT 6-24 HRS) Nasopharyngeal Nasopharyngeal Swab     Status: None   Collection Time: 06/03/19  5:00 AM   Specimen: Nasopharyngeal Swab  Result Value Ref Range Status   SARS Coronavirus 2 NEGATIVE NEGATIVE Final    Comment: (NOTE) SARS-CoV-2 target nucleic acids are NOT DETECTED. The SARS-CoV-2 RNA is generally detectable in upper and lower respiratory specimens during the acute phase of infection.  Negative results do not preclude SARS-CoV-2 infection, do not rule out co-infections with other pathogens, and should not be used as the sole basis for treatment or other patient management decisions. Negative results must be combined with clinical observations, patient history, and epidemiological information. The expected result is Negative. Fact Sheet for Patients: SugarRoll.be Fact Sheet for Healthcare Providers: https://www.woods-mathews.com/ This test is not yet approved or cleared by  the [employer] and  has been authorized for detection and/or diagnosis of SARS-CoV-2 by FDA under an Emergency Use Authorization (EUA). This EUA will remain  in effect (meaning this test can be used) for the duration of the COVID-19 declaration under Section 56 4(b)(1) of the Act, 21 U.S.C. section 360bbb-3(b)(1), unless the authorization is terminated or revoked sooner. Performed at [facility], [place] [address], [place], [place] [address]      Labs: BNP (last 3 results) No results for input(s): BNP in the last 8760 hours. Basic Metabolic Panel: Recent Labs  Lab 05/30/19 0529 05/30/19 0529 05/31/19 0527 05/31/19 0527 06/01/19 0113 06/01/19 0113 06/01/19 1716 06/01/19 1815 06/02/19 0443 06/03/19 0240 06/04/19 0305  NA 135   < > 133*   < > 134*   < > 133* 133* 134* 137 135  K 3.9   < > 3.5   < > 4.0   < > 4.6 4.9 4.6 4.1 3.9  CL 107   < > 107   < > 104   < > 104 102 106 106 104  CO2 20*   < > 23  --  22  --   --   --  20* 24 24  GLUCOSE 140*   < > 143*   < > 131*   < > 212* 231* 224* 234* 150*  BUN 24*   < > 19   < > 19   < > '16 17 23 ' 25* 23  CREATININE 1.36*   < > 1.21   < > 1.19   < > 1.00 1.10 1.08 1.14 1.08  CALCIUM 9.0   < > 8.7*  --  9.1  --   --   --  9.0 9.0 9.0  MG 1.7  --  2.0  --   --   --   --   --   --   --  1.6*   < > = values in this interval not displayed.   Liver Function Tests: Recent Labs  Lab 05/28/19 1300   AST 22  ALT 12  ALKPHOS 33*  BILITOT 0.5  PROT 6.3*  ALBUMIN 2.5*   Recent Labs  Lab 05/28/19 1300  LIPASE 64*   No results for input(s): AMMONIA in the last 168 hours. CBC: Recent Labs  Lab 05/28/19 1300 05/29/19 0540 05/30/19 0529 05/30/19 1455 05/31/19 0527 05/31/19 1915 06/01/19 0113 06/01/19 1716 06/01/19 1815 06/02/19 0443 06/04/19 0305  WBC 5.4   < > 4.7  --  4.7  --  6.1  --   --  6.0 7.5  NEUTROABS 3.1  --   --   --   --   --   --   --   --   --   --   HGB 9.1*   < > 7.4*   < > 6.9*   < > 8.9* 10.9* 10.5* 9.2* 8.0*  HCT 29.5*   < > 23.9*   < > 22.7*   < > 28.0* 32.0* 31.0* 28.2* 25.4*  MCV 86.8   < > 87.2  --  87.3  --  87.2  --   --  87.6 88.5  PLT 311   < > 292  --  282  --  283  --   --  278 303   < > = values in this interval not displayed.   Cardiac Enzymes: No results for input(s): CKTOTAL, CKMB, CKMBINDEX, TROPONINI in the last 168  hours. BNP: Invalid input(s): POCBNP CBG: Recent Labs  Lab 06/03/19 0012 06/03/19 0732 06/03/19 1149 06/03/19 1622 06/03/19 2107  GLUCAP 288* 148* 218* 274* 277*   D-Dimer No results for input(s): DDIMER in the last 72 hours. Hgb A1c No results for input(s): HGBA1C in the last 72 hours. Lipid Profile No results for input(s): CHOL, HDL, LDLCALC, TRIG, CHOLHDL, LDLDIRECT in the last 72 hours. Thyroid function studies No results for input(s): TSH, T4TOTAL, T3FREE, THYROIDAB in the last 72 hours.  Invalid input(s): FREET3 Anemia work up No results for input(s): VITAMINB12, FOLATE, FERRITIN, TIBC, IRON, RETICCTPCT in the last 72 hours. Urinalysis    Component Value Date/Time   COLORURINE YELLOW 05/28/2019 1627   APPEARANCEUR CLEAR 05/28/2019 1627   LABSPEC 1.012 05/28/2019 1627   PHURINE 7.0 05/28/2019 1627   GLUCOSEU NEGATIVE 05/28/2019 1627   HGBUR MODERATE (A) 05/28/2019 1627   BILIRUBINUR NEGATIVE 05/28/2019 1627   KETONESUR NEGATIVE 05/28/2019 1627   PROTEINUR NEGATIVE 05/28/2019 1627   NITRITE  NEGATIVE 05/28/2019 1627   LEUKOCYTESUR NEGATIVE 05/28/2019 1627   Sepsis Labs Invalid input(s): PROCALCITONIN,  WBC,  LACTICIDVEN Microbiology Recent Results (from the past 240 hour(s))  Respiratory Panel by RT PCR (Flu A&B, Covid) - Nasopharyngeal Swab     Status: None   Collection Time: 05/28/19  1:00 PM   Specimen: Nasopharyngeal Swab  Result Value Ref Range Status   SARS Coronavirus 2 by RT PCR NEGATIVE NEGATIVE Final    Comment: (NOTE) SARS-CoV-2 target nucleic acids are NOT DETECTED. The SARS-CoV-2 RNA is generally detectable in upper respiratoy specimens during the acute phase of infection. The lowest concentration of SARS-CoV-2 viral copies this assay can detect is 131 copies/mL. A negative result does not preclude SARS-Cov-2 infection and should not be used as the sole basis for treatment or other patient management decisions. A negative result may occur with  improper specimen collection/handling, submission of specimen other than nasopharyngeal swab, presence of viral mutation(s) within the areas targeted by this assay, and inadequate number of viral copies (<131 copies/mL). A negative result must be combined with clinical observations, patient history, and epidemiological information. The expected result is Negative. Fact Sheet for Patients:  PinkCheek.be Fact Sheet for Healthcare Providers:  GravelBags.it This test is not yet ap proved or cleared by the [place] FDA and  has been authorized for detection and/or diagnosis of SARS-CoV-2 by FDA under an Emergency Use Authorization (EUA). This EUA will remain  in effect (meaning this test can be used) for the duration of the COVID-19 declaration under Section 564(b)(1) of the Act, 21 U.S.C. section 360bbb-3(b)(1), unless the authorization is terminated or revoked sooner.    Influenza A by PCR NEGATIVE NEGATIVE Final   Influenza B by PCR NEGATIVE NEGATIVE  Final    Comment: (NOTE) The Xpert Xpress SARS-CoV-2/FLU/RSV assay is intended as an aid in  the diagnosis of influenza from Nasopharyngeal swab specimens and  should not be used as a sole basis for treatment. Nasal washings and  aspirates are unacceptable for Xpert Xpress SARS-CoV-2/FLU/RSV  testing. Fact Sheet for Patients: PinkCheek.be Fact Sheet for Healthcare Providers: GravelBags.it This test is not yet approved or cleared by the [place] FDA and  has been authorized for detection and/or diagnosis of SARS-CoV-2 by  FDA under an Emergency Use Authorization (EUA). This EUA will remain  in effect (meaning this test can be used) for the duration of the  Covid-19 declaration under Section 564(b)(1) of the Act, 21  U.S.C. section 360bbb-3(b)(1),  unless the authorization is  terminated or revoked. Performed at [facility], [place] [address], [place], [place] [address]   Blood culture (routine x 2)     Status: None   Collection Time: 05/28/19  1:00 PM   Specimen: BLOOD  Result Value Ref Range Status   Specimen Description   Final    BLOOD RIGHT ANTECUBITAL Performed at [facility] [address] [place], [place] [address]    Special Requests   Final    BOTTLES DRAWN AEROBIC AND ANAEROBIC Blood Culture results may not be optimal due to an excessive volume of blood received in culture bottles Performed at [facility] [address] [place], [place] [address]    Culture   Final    NO GROWTH 5 DAYS Performed at [facility], [place] [address], [place], [place] [address]    Report Status 06/02/2019 FINAL  Final  Blood culture (routine x 2)     Status: None   Collection Time: 05/28/19  1:20 PM   Specimen: BLOOD  Result Value Ref Range Status   Specimen Description   Final    BLOOD LEFT ANTECUBITAL Performed at [facility]  [address], [place], [place] [address]    Special Requests   Final    BOTTLES DRAWN AEROBIC AND ANAEROBIC Blood Culture adequate volume Performed at [facility] [address] [place], [place] [address]    Culture   Final    NO GROWTH 5 DAYS Performed at [facility], [place] [address], [place], [place] [address]    Report Status 06/02/2019 FINAL  Final  Urine culture     Status: None   Collection Time: 05/28/19  4:27 PM   Specimen: In/Out Cath Urine  Result Value Ref Range Status   Specimen Description   Final    IN/OUT CATH URINE Performed at [place] [address] [place], [place] [address]    Special Requests   Final    NONE Performed at [facility], [place] [address], [place], [place] [address]    Culture   Final    NO GROWTH Performed at [facility], [place] [address], [place], [place] [address]    Report Status 05/29/2019 FINAL  Final  C Difficile Quick Screen w PCR reflex     Status: None   Collection Time: 05/29/19 12:34 AM   Specimen: STOOL  Result Value Ref Range Status   C Diff antigen NEGATIVE NEGATIVE Final   C Diff toxin NEGATIVE NEGATIVE Final   C Diff interpretation No C. difficile detected.  Final    Comment: Performed at [facility], [place] [address] [place], [place] [address]  Body fluid culture     Status: None   Collection Time: 05/29/19  4:29 PM   Specimen: Synovium; Body Fluid  Result Value Ref Range Status   Specimen Description   Final    SYNOVIAL LEFT HIP Performed at [facility] [address], [place], [place] [address]    Special Requests   Final    NONE Performed at [facility], [place] [address] [place], [place] [address]    Gram Stain   Final    ABUNDANT WBC PRESENT,BOTH PMN AND MONONUCLEAR NO ORGANISMS SEEN Gram Stain Report Called to,Read Back By and Verified With: C.FRANKLIN AT 6283 ON 05/29/19 BY  N.THOMPSON Performed at [facility], [place] [address] [place], [place] [address]    Culture   Final    NO GROWTH 3 DAYS Performed at [facility], [place] [address] [place], [place] [address]    Report Status 06/02/2019 FINAL  Final  Anaerobic culture     Status: None   Collection Time: 05/29/19  4:29 PM   Specimen: Synovium; Synovial Fluid  Result Value Ref Range Status   Specimen Description   Final    SYNOVIAL LEFT HIP Performed at [place] [address] [place], [place] [address]    Special Requests   Final    NONE Performed at [facility], [place] [address] [place] [address], [place], [place] [address]    Gram Stain   Final    ABUNDANT WBC PRESENT,BOTH PMN AND MONONUCLEAR NO ORGANISMS SEEN    Culture   Final    NO ANAEROBES ISOLATED Performed at [facility], [place] [address] [place], [place] [address]    Report Status 06/04/2019 FINAL  Final  MRSA PCR Screening     Status: None   Collection Time: 06/01/19  5:29 AM   Specimen: Nasal Mucosa; Nasopharyngeal  Result Value Ref Range Status   MRSA by PCR NEGATIVE NEGATIVE Final    Comment:        The GeneXpert MRSA Assay (FDA approved for NASAL specimens only), is one component of a comprehensive MRSA colonization surveillance program. It is not intended to diagnose MRSA infection nor to guide or monitor treatment for MRSA infections. Performed at [facility], [place] [address], [place], [place] [address]   Aerobic/Anaerobic Culture (surgical/deep wound)     Status: None (Preliminary result)   Collection Time: 06/01/19  4:47 PM   Specimen: Wound; Body Fluid  Result Value Ref Range Status   Specimen Description   Final    FLUID LEFT HIP Performed at [place] [address], [place], [place] [address]    Special Requests IN CUP  Final   Gram Stain   Final    ABUNDANT WBC PRESENT, PREDOMINANTLY PMN NO ORGANISMS SEEN     Culture   Final    NO GROWTH 3 DAYS NO ANAEROBES ISOLATED; CULTURE IN PROGRESS FOR 5 DAYS Performed at [place] [address] [place], [place] [address]    Report Status PENDING  Incomplete  Aerobic/Anaerobic Culture (surgical/deep wound)     Status: None (Preliminary result)   Collection Time: 06/01/19  4:52 PM   Specimen: Wound; Body Fluid  Result Value Ref Range Status   Specimen Description   Final    FLUID LEFT HIP Performed at [facility], [address], [place], [place] [address]    Special Requests   Final    FLUID NO. 2 Performed at [facility], [place] [address], [place], [place] [address]    Gram Stain   Final    ABUNDANT WBC PRESENT, PREDOMINANTLY PMN NO ORGANISMS SEEN    Culture   Final    NO GROWTH 3 DAYS NO ANAEROBES ISOLATED; CULTURE IN PROGRESS FOR 5 DAYS Performed at [place] [address] [place],  [address]    Report Status PENDING  Incomplete  SARS CORONAVIRUS 2 (TAT 6-24 HRS) Nasopharyngeal Nasopharyngeal Swab     Status: None   Collection Time: 06/03/19  5:00 AM   Specimen: Nasopharyngeal Swab  Result Value Ref Range Status   SARS Coronavirus 2 NEGATIVE  NEGATIVE Final    Comment: (NOTE) SARS-CoV-2 target nucleic acids are NOT DETECTED. The SARS-CoV-2 RNA is generally detectable in upper and lower respiratory specimens during the acute phase of infection. Negative results do not preclude SARS-CoV-2 infection, do not rule out co-infections with other pathogens, and should not be used as the sole basis for treatment or other patient management decisions. Negative results must be combined with clinical observations, patient history, and epidemiological information. The expected result is Negative. Fact Sheet for Patients: SugarRoll.be Fact Sheet for Healthcare Providers: https://www.woods-mathews.com/ This test is not yet approved or cleared by the [place] FDA and  has been authorized for detection and/or diagnosis of SARS-CoV-2 by FDA under an Emergency Use Authorization (EUA). This EUA will remain  in effect (meaning this test can be used) for the duration of the COVID-19 declaration under Section 56 4(b)(1) of the Act, 21 U.S.C. section 360bbb-3(b)(1), unless the authorization is terminated or revoked sooner. Performed at [facility], [place] [address], [place], [place] [address]      Time coordinating discharge:  I have spent 35 minutes face to face with the patient and on the ward discussing the patients care, assessment, plan and disposition with other care givers. >50% of the time was devoted counseling the patient about the risks and benefits of treatment/Discharge disposition and coordinating care.   SIGNED:   Damita Lack, MD  [facility] 06/04/2019, 10:41 AM   If 7PM-7AM, please contact night-coverage

## 2019-06-05 LAB — GLUCOSE, CAPILLARY: Glucose-Capillary: 112 mg/dL — ABNORMAL HIGH (ref 70–99)

## 2019-06-06 LAB — AEROBIC/ANAEROBIC CULTURE W GRAM STAIN (SURGICAL/DEEP WOUND)
Culture: NO GROWTH
Culture: NO GROWTH

## 2019-06-08 ENCOUNTER — Telehealth: Payer: Self-pay | Admitting: Infectious Diseases

## 2019-06-08 ENCOUNTER — Telehealth: Payer: Self-pay

## 2019-06-08 DIAGNOSIS — I513 Intracardiac thrombosis, not elsewhere classified: Secondary | ICD-10-CM | POA: Diagnosis not present

## 2019-06-08 DIAGNOSIS — M009 Pyogenic arthritis, unspecified: Secondary | ICD-10-CM | POA: Diagnosis not present

## 2019-06-08 DIAGNOSIS — R319 Hematuria, unspecified: Secondary | ICD-10-CM | POA: Diagnosis not present

## 2019-06-08 NOTE — Telephone Encounter (Signed)
Hip Cx final (-)

## 2019-06-08 NOTE — Telephone Encounter (Signed)
Received call from patient's SNF asking about IV abx dosing. Unsure if our pharmacy team is adjusting medication or if the dosing is being adjusted by their team. Will forward to pharmacy team for follow up.  Dinesh Ulysse [place] Officer, RN

## 2019-06-09 NOTE — Telephone Encounter (Signed)
SNF pharmacy should be dosing vancomycin, thank you!

## 2019-06-10 ENCOUNTER — Encounter: Payer: Self-pay | Admitting: Internal Medicine

## 2019-06-10 MED FILL — Tobramycin Sulfate For Inj 1.2 GM: INTRAMUSCULAR | Qty: 1.2 | Status: AC

## 2019-06-10 MED FILL — Vancomycin HCl For IV Soln 500 MG (Base Equivalent): INTRAVENOUS | Qty: 500 | Status: AC

## 2019-06-11 DIAGNOSIS — R310 Gross hematuria: Secondary | ICD-10-CM | POA: Diagnosis not present

## 2019-06-11 DIAGNOSIS — C678 Malignant neoplasm of overlapping sites of bladder: Secondary | ICD-10-CM | POA: Diagnosis not present

## 2019-06-11 DIAGNOSIS — R338 Other retention of urine: Secondary | ICD-10-CM | POA: Diagnosis not present

## 2019-06-15 ENCOUNTER — Other Ambulatory Visit: Payer: Self-pay

## 2019-06-15 ENCOUNTER — Ambulatory Visit (INDEPENDENT_AMBULATORY_CARE_PROVIDER_SITE_OTHER): Payer: Medicare Other | Admitting: Internal Medicine

## 2019-06-15 ENCOUNTER — Encounter: Payer: Self-pay | Admitting: Internal Medicine

## 2019-06-15 DIAGNOSIS — Z5181 Encounter for therapeutic drug level monitoring: Secondary | ICD-10-CM

## 2019-06-15 DIAGNOSIS — Z452 Encounter for adjustment and management of vascular access device: Secondary | ICD-10-CM

## 2019-06-15 DIAGNOSIS — M009 Pyogenic arthritis, unspecified: Secondary | ICD-10-CM

## 2019-06-15 DIAGNOSIS — T8452XD Infection and inflammatory reaction due to internal left hip prosthesis, subsequent encounter: Secondary | ICD-10-CM

## 2019-06-15 HISTORY — DX: Encounter for adjustment and management of vascular access device: Z45.2

## 2019-06-15 HISTORY — DX: Encounter for therapeutic drug level monitoring: Z51.81

## 2019-06-15 NOTE — Assessment & Plan Note (Signed)
He seems to be doing well with treatment and good response.  Will plan to continue through June 10th and will check inflammatory markers now and in 2 weeks and if ok, will stop 6/10 and observe off of antibiotics rtc 3 weeks

## 2019-06-15 NOTE — Progress Notes (Signed)
   Subjective:    Patient ID: Casey [name], male    DOB: 1929-02-05, 86 y.o.   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 Alvan Dame.  Initially had a polyexchange and on penicillin but did not improve and came back for the resection arthroplasty.  He was placed on ceftriaxone and vancomycin for a 6 week course projected to continue through 07/09/19.  He is doing well with his treatment and no new issues.  No associated rash or diarrhea.  No complaints today.    Review of Systems  Constitutional: Negative for chills and fever.  Gastrointestinal: Negative for diarrhea and nausea.  Skin: Negative for rash.       Objective:   Physical Exam Constitutional:      Appearance: Normal appearance.  Musculoskeletal:     Left lower leg: No edema.  Neurological:     Mental Status: He is alert.  Psychiatric:        Mood and Affect: Mood normal.   SH: continues in rehab        Assessment & Plan:

## 2019-06-15 NOTE — Assessment & Plan Note (Signed)
Working well and no issues.  Will review labs in 3 weeks and patient prior to decision to pull picc and stop.

## 2019-06-15 NOTE — Assessment & Plan Note (Signed)
Creat has remained stable and vancomycin trough in a good level between 15-20.  Will continue to monitor

## 2019-06-17 DIAGNOSIS — I513 Intracardiac thrombosis, not elsewhere classified: Secondary | ICD-10-CM | POA: Diagnosis not present

## 2019-06-17 DIAGNOSIS — R319 Hematuria, unspecified: Secondary | ICD-10-CM | POA: Diagnosis not present

## 2019-06-17 DIAGNOSIS — M009 Pyogenic arthritis, unspecified: Secondary | ICD-10-CM | POA: Diagnosis not present

## 2019-06-18 DIAGNOSIS — M009 Pyogenic arthritis, unspecified: Secondary | ICD-10-CM | POA: Diagnosis not present

## 2019-06-18 DIAGNOSIS — R319 Hematuria, unspecified: Secondary | ICD-10-CM | POA: Diagnosis not present

## 2019-06-18 DIAGNOSIS — N189 Chronic kidney disease, unspecified: Secondary | ICD-10-CM | POA: Diagnosis not present

## 2019-06-21 ENCOUNTER — Emergency Department (HOSPITAL_COMMUNITY)
Admission: EM | Admit: 2019-06-21 | Discharge: 2019-06-21 | Disposition: A | Discharge status: Home / Self Care | Payer: Medicare Other | Attending: Emergency Medicine | Admitting: Emergency Medicine

## 2019-06-21 ENCOUNTER — Emergency Department (HOSPITAL_COMMUNITY): Payer: Medicare Other

## 2019-06-21 ENCOUNTER — Other Ambulatory Visit: Payer: Self-pay

## 2019-06-21 DIAGNOSIS — Y9301 Activity, walking, marching and hiking: Secondary | ICD-10-CM | POA: Insufficient documentation

## 2019-06-21 DIAGNOSIS — W19XXXA Unspecified fall, initial encounter: Secondary | ICD-10-CM

## 2019-06-21 DIAGNOSIS — Z7901 Long term (current) use of anticoagulants: Secondary | ICD-10-CM | POA: Diagnosis not present

## 2019-06-21 DIAGNOSIS — Z9889 Other specified postprocedural states: Secondary | ICD-10-CM | POA: Insufficient documentation

## 2019-06-21 DIAGNOSIS — S0083XA Contusion of other part of head, initial encounter: Secondary | ICD-10-CM | POA: Insufficient documentation

## 2019-06-21 DIAGNOSIS — S161XXA Strain of muscle, fascia and tendon at neck level, initial encounter: Secondary | ICD-10-CM

## 2019-06-21 DIAGNOSIS — Y999 Unspecified external cause status: Secondary | ICD-10-CM | POA: Insufficient documentation

## 2019-06-21 DIAGNOSIS — S0990XA Unspecified injury of head, initial encounter: Secondary | ICD-10-CM | POA: Insufficient documentation

## 2019-06-21 DIAGNOSIS — W1830XA Fall on same level, unspecified, initial encounter: Secondary | ICD-10-CM | POA: Insufficient documentation

## 2019-06-21 DIAGNOSIS — R519 Headache, unspecified: Secondary | ICD-10-CM | POA: Diagnosis not present

## 2019-06-21 DIAGNOSIS — Y92531 Health care provider office as the place of occurrence of the external cause: Secondary | ICD-10-CM | POA: Insufficient documentation

## 2019-06-21 LAB — BASIC METABOLIC PANEL
Anion gap: 9 (ref 5–15)
BUN: 17 mg/dL (ref 8–23)
CO2: 24 mmol/L (ref 22–32)
Calcium: 9.9 mg/dL (ref 8.9–10.3)
Chloride: 108 mmol/L (ref 98–111)
Creatinine, Ser: 1.4 mg/dL — ABNORMAL HIGH (ref 0.61–1.24)
GFR calc Af Amer: 52 mL/min — ABNORMAL LOW (ref >60)
GFR calc non Af Amer: 45 mL/min — ABNORMAL LOW (ref >60)
Glucose, Bld: 84 mg/dL (ref 70–99)
Potassium: 3.3 mmol/L — ABNORMAL LOW (ref 3.5–5.1)
Sodium: 141 mmol/L (ref 135–145)

## 2019-06-21 LAB — CBC WITH DIFFERENTIAL/PLATELET
Abs Immature Granulocytes: 0.04 10*3/uL (ref 0.00–0.07)
Basophils Absolute: 0.1 10*3/uL (ref 0.0–0.1)
Basophils Relative: 1 %
Eosinophils Absolute: 0.7 10*3/uL — ABNORMAL HIGH (ref 0.0–0.5)
Eosinophils Relative: 11 %
HCT: 30.5 % — ABNORMAL LOW (ref 39.0–52.0)
Hemoglobin: 9.3 g/dL — ABNORMAL LOW (ref 13.0–17.0)
Immature Granulocytes: 1 %
Lymphocytes Relative: 24 %
Lymphs Abs: 1.5 10*3/uL (ref 0.7–4.0)
MCH: 27.2 pg (ref 26.0–34.0)
MCHC: 30.5 g/dL (ref 30.0–36.0)
MCV: 89.2 fL (ref 80.0–100.0)
Monocytes Absolute: 0.5 10*3/uL (ref 0.1–1.0)
Monocytes Relative: 8 %
Neutro Abs: 3.4 10*3/uL (ref 1.7–7.7)
Neutrophils Relative %: 55 %
Platelets: 301 10*3/uL (ref 150–400)
RBC: 3.42 MIL/uL — ABNORMAL LOW (ref 4.22–5.81)
RDW: 15.7 % — ABNORMAL HIGH (ref 11.5–15.5)
WBC: 6.2 10*3/uL (ref 4.0–10.5)
nRBC: 0 % (ref 0.0–0.2)

## 2019-06-21 LAB — PROTIME-INR
INR: 1.7 — ABNORMAL HIGH (ref 0.8–1.2)
Prothrombin Time: 19.4 seconds — ABNORMAL HIGH (ref 11.4–15.2)

## 2019-06-21 NOTE — Discharge Instructions (Addendum)
Continue medications as previously prescribed.  Return to the emergency department if you develop severe headache, difficulty with your vision or balance, or other new and concerning symptoms.

## 2019-06-21 NOTE — ED Triage Notes (Signed)
Pt comes from [facility], was walking [place] when he slipped and fell, swelling to head, denies LOC. On Xarelto. For GEMS: CBG 93, 172/83, 96% RA. HR 72. Alert oriented x4.

## 2019-06-21 NOTE — ED Provider Notes (Signed)
[facility] EMERGENCY DEPARTMENT Provider Note   CSN: [id number] Arrival date & time: 06/21/19  0915     History No chief complaint on file.   Casey [name] is a 86 y.o. male.  Patient is an 86 year old male with past medical history of left total hip replacement which subsequently became infected, then removed.  Patient is receiving IV antibiotics through a PICC line.  He was brought here by EMS from his rehab facility for evaluation of fall.  Patient was ambulating this morning with the walker when he stumbled and fell and hit the left side of his head on the floor.  He denies any loss of consciousness.  He denies headache or neck pain, but does take Xarelto.  He denies visual disturbances.  He denies any other injury in the fall.  The history is provided by the patient.       No past medical history on file.  There are no problems to display for this patient.        No family history on file.  Social History   Tobacco Use  . Smoking status: Not on file  Substance Use Topics  . Alcohol use: Not on file  . Drug use: Not on file    Home Medications Prior to Admission medications   Not on File    Allergies    Patient has no allergy information on record.  Review of Systems   Review of Systems  All other systems reviewed and are negative.   Physical Exam Updated Vital Signs SpO2 96%   Physical Exam Vitals and nursing note reviewed.  Constitutional:      General: He is not in acute distress.    Appearance: He is well-developed. He is not diaphoretic.  HENT:     Head: Normocephalic and atraumatic.     Comments: There is a 3 cm, round, swollen area to the left parietal region.  There is no palpable defect. Eyes:     Extraocular Movements: Extraocular movements intact.     Pupils: Pupils are equal, round, and reactive to light.  Neck:     Comments: There is mild tenderness in the soft tissues of the cervical region.  There is no  bony tenderness or step-off. Cardiovascular:     Rate and Rhythm: Normal rate and regular rhythm.     Heart sounds: No murmur. No friction rub.  Pulmonary:     Effort: Pulmonary effort is normal. No respiratory distress.     Breath sounds: Normal breath sounds. No wheezing or rales.  Abdominal:     General: Bowel sounds are normal. There is no distension.     Palpations: Abdomen is soft.     Tenderness: There is no abdominal tenderness.  Musculoskeletal:        General: Normal range of motion.     Cervical back: Normal range of motion and neck supple.  Skin:    General: Skin is warm and dry.  Neurological:     General: No focal deficit present.     Mental Status: He is alert and oriented to person, place, and time.     Cranial Nerves: No cranial nerve deficit.     Sensory: No sensory deficit.     Coordination: Coordination normal.     ED Results / Procedures / Treatments   Labs (all labs ordered are listed, but only abnormal results are displayed) Labs Reviewed  BASIC METABOLIC PANEL  CBC WITH DIFFERENTIAL/PLATELET  PROTIME-INR  EKG None  Radiology No results found.  Procedures Procedures (including critical care time)  Medications Ordered in ED Medications - No data to display  ED Course  I have reviewed the triage vital signs and the nursing notes.  Pertinent labs & imaging results that were available during my care of the patient were reviewed by me and considered in my medical decision making (see chart for details).    MDM Rules/Calculators/A&P  Patient is an 86 year old male sent from his extended care facility for evaluation of a fall.  Patient takes Xarelto and tripped while walking with his walker.  He struck his head but denies any loss of consciousness or neurologic symptoms.  Patient's CT scan of his head is negative and cervical spine is unremarkable.  Laboratory studies are also essentially unremarkable.  At this point, I see no indication for  admission.  I feels the patient is appropriate for discharge with observation at his extended care facility.  He is to return for headache, visual disturbances, or difficulty arousing.  Final Clinical Impression(s) / ED Diagnoses Final diagnoses:  None    Rx / DC Orders ED Discharge Orders    None       Veryl Speak, MD 06/21/19 1103

## 2019-06-21 NOTE — Progress Notes (Signed)
Orthopedic Tech Progress Note Patient Details:  JAREMY HARTLINE 12/18/1929 [id number] Level 2 Trauma  Patient ID: TONYA KOPAS, male   DOB: May 23, 1929, 86 y.o.   MRN: [id number]   Chip Boer 06/21/2019, 9:42 AM

## 2019-06-22 ENCOUNTER — Telehealth: Payer: Self-pay

## 2019-06-22 DIAGNOSIS — R319 Hematuria, unspecified: Secondary | ICD-10-CM | POA: Diagnosis not present

## 2019-06-22 DIAGNOSIS — I513 Intracardiac thrombosis, not elsewhere classified: Secondary | ICD-10-CM | POA: Diagnosis not present

## 2019-06-22 DIAGNOSIS — M009 Pyogenic arthritis, unspecified: Secondary | ICD-10-CM | POA: Diagnosis not present

## 2019-06-22 NOTE — Telephone Encounter (Signed)
NOTES ON FILE FROM [facility] [phone] SENT REFERRAL TO SCHEDULING 

## 2019-06-24 DIAGNOSIS — M009 Pyogenic arthritis, unspecified: Secondary | ICD-10-CM | POA: Diagnosis not present

## 2019-06-24 DIAGNOSIS — E039 Hypothyroidism, unspecified: Secondary | ICD-10-CM | POA: Diagnosis not present

## 2019-06-24 DIAGNOSIS — R319 Hematuria, unspecified: Secondary | ICD-10-CM | POA: Diagnosis not present

## 2019-07-01 DIAGNOSIS — I513 Intracardiac thrombosis, not elsewhere classified: Secondary | ICD-10-CM | POA: Diagnosis not present

## 2019-07-01 DIAGNOSIS — M009 Pyogenic arthritis, unspecified: Secondary | ICD-10-CM | POA: Diagnosis not present

## 2019-07-01 DIAGNOSIS — R319 Hematuria, unspecified: Secondary | ICD-10-CM | POA: Diagnosis not present

## 2019-07-06 DIAGNOSIS — R319 Hematuria, unspecified: Secondary | ICD-10-CM | POA: Diagnosis not present

## 2019-07-06 DIAGNOSIS — M009 Pyogenic arthritis, unspecified: Secondary | ICD-10-CM | POA: Diagnosis not present

## 2019-07-06 DIAGNOSIS — I513 Intracardiac thrombosis, not elsewhere classified: Secondary | ICD-10-CM | POA: Diagnosis not present

## 2019-07-08 ENCOUNTER — Encounter: Payer: Self-pay | Admitting: Internal Medicine

## 2019-07-08 ENCOUNTER — Other Ambulatory Visit: Payer: Self-pay

## 2019-07-08 ENCOUNTER — Ambulatory Visit (INDEPENDENT_AMBULATORY_CARE_PROVIDER_SITE_OTHER): Payer: Medicare Other | Admitting: Internal Medicine

## 2019-07-08 VITALS — BP 144/62 | HR 81 | Temp 98.0°F

## 2019-07-08 DIAGNOSIS — M009 Pyogenic arthritis, unspecified: Secondary | ICD-10-CM

## 2019-07-08 DIAGNOSIS — R600 Localized edema: Secondary | ICD-10-CM | POA: Diagnosis not present

## 2019-07-08 DIAGNOSIS — T8452XD Infection and inflammatory reaction due to internal left hip prosthesis, subsequent encounter: Secondary | ICD-10-CM

## 2019-07-08 DIAGNOSIS — R319 Hematuria, unspecified: Secondary | ICD-10-CM | POA: Diagnosis not present

## 2019-07-08 DIAGNOSIS — Z452 Encounter for adjustment and management of vascular access device: Secondary | ICD-10-CM

## 2019-07-08 DIAGNOSIS — I513 Intracardiac thrombosis, not elsewhere classified: Secondary | ICD-10-CM | POA: Diagnosis not present

## 2019-07-08 DIAGNOSIS — B951 Streptococcus, group B, as the cause of diseases classified elsewhere: Secondary | ICD-10-CM

## 2019-07-08 DIAGNOSIS — A491 Streptococcal infection, unspecified site: Secondary | ICD-10-CM

## 2019-07-08 NOTE — Progress Notes (Signed)
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 He was discharged to SNF to have Iv abtx completion. His aide reports that the original picc line on right arm had infiltrated with abtx induration into forearm (had assymetric swelling still) -- then removed and went into left arm - Has been ambulating with walker to bed from bathroom.  No diarrhea ---- LE edema. He is slated to go home on sunday  Outpatient Encounter Medications as of 07/08/2019  Medication Sig  . acetaminophen (TYLENOL) 325 MG tablet Take 650 mg by mouth every 6 (six) hours.  . baclofen (LIORESAL) 10 MG tablet Take 1 tablet (10 mg total) by mouth 2 (two) times daily.  . cefTRIAXone (ROCEPHIN) IVPB Inject 2 g into the vein daily. Indication:  Prosthetic joint infection First Dose: Yes Last Day of Therapy:  07/09/19 Labs - Once weekly:  CBC/D and BMP, Labs - Every other week:  ESR and CRP Method of administration: IV Push Method of administration may be changed at the discretion of home infusion pharmacist based upon assessment of the patient and/or caregiver's ability to self-administer the medication ordered.  . Cholecalciferol (DIALYVITE VITAMIN D 5000) 125 MCG (5000 UT) capsule Take 5,000 Units by mouth daily.  . Choline Fenofibrate (FENOFIBRIC ACID) 135 MG CPDR Take 135 mg by mouth daily.   . Dulaglutide (TRULICITY) 0.27 OZ/3.6UY SOPN Inject 0.75 mg into the skin once a week. Thursday  . Fe Bisgly-Vit C-Vit B12-FA (GENTLE IRON PO) Take 1 capsule by mouth daily.  . finasteride (PROSCAR) 5 MG tablet Take 5 mg by mouth daily.  . furosemide (LASIX) 20 MG tablet Take 20 mg by mouth every evening.  . furosemide (LASIX) 40 MG tablet Take 40 mg by mouth in the morning.  Marland Kitchen  glimepiride (AMARYL) 4 MG tablet Take 4 mg by mouth in the morning and at bedtime. Prescribed twice daily, patient may take once daily based on his BS level.  Marland Kitchen HYDROcodone-acetaminophen (NORCO) 5-325 MG tablet Take 1-2 tablets by mouth every 6 (six) hours as needed for severe pain (Use tylenol for mild and moderate pain.).  Marland Kitchen levothyroxine (SYNTHROID) 25 MCG tablet Take 25 mcg by mouth daily before breakfast.  . lisinopril (ZESTRIL) 20 MG tablet Take 20 mg by mouth daily.   Marland Kitchen loperamide (IMODIUM) 2 MG capsule Take by mouth as needed for diarrhea or loose stools.  Marland Kitchen loratadine (CLARITIN) 10 MG tablet Take 10 mg by mouth daily as needed for allergies.  . methocarbamol (ROBAXIN) 500 MG tablet Take 1 tablet (500 mg total) by mouth every 6 (six) hours as needed for muscle spasms.  . metoprolol succinate (TOPROL-XL) 50 MG 24 hr tablet Take 25 mg by mouth daily.   . Multiple Vitamin (MULTIVITAMIN) tablet Take 1 tablet by mouth daily.  . Omega-3 Fatty Acids (FISH OIL) 1000 MG CAPS Take 1 capsule by mouth daily.  . ondansetron (ZOFRAN) 4 MG tablet Take 1 tablet (4 mg total) by mouth every 8 (eight) hours as needed for nausea or vomiting.  . pantoprazole (PROTONIX) 40 MG tablet Take 40 mg by mouth daily.  . polyethylene glycol (MIRALAX / GLYCOLAX) 17 g packet Take 17 g by mouth daily as needed for moderate constipation or severe constipation.  Marland Kitchen  Probiotic Product (RISA-BID PROBIOTIC PO) Take 1 tablet by mouth 2 (two) times daily.  . rivaroxaban (XARELTO) 10 MG TABS tablet Take 10 mg by mouth daily.  . sodium bicarbonate 650 MG tablet Take 650 mg by mouth 3 (three) times daily.  . sucralfate (CARAFATE) 1 g tablet Take 1 g by mouth 4 (four) times daily -  with meals and at bedtime.  . Tamsulosin HCl (FLOMAX) 0.4 MG CAPS Take 0.4 mg by mouth in the morning and at bedtime.   . vancomycin IVPB Inject 1,000 mg into the vein daily. Indication:  Prosthetic joint infection First Dose: No Last Day of Therapy:   07/09/19 Labs - Sunday/Monday:  CBC/D, BMP, and vancomycin trough. Labs - Thursday:  BMP and vancomycin trough Labs - Every other week:  ESR and CRP Method of administration:Elastomeric Method of administration may be changed at the discretion of the patient and/or caregiver's ability to self-administer the medication ordered.  Marland Kitchen acetaminophen (TYLENOL) 325 MG tablet Take 650 mg by mouth every 6 (six) hours as needed.  . baclofen (LIORESAL) 10 MG tablet Take 10 mg by mouth 2 (two) times daily.  . bisacodyl (DULCOLAX) 10 MG suppository Place 1 suppository (10 mg total) rectally daily as needed for moderate constipation. (Patient not taking: Reported on 06/15/2019)  . cefTRIAXone 2 g in sodium chloride 0.9 % 100 mL Inject 2 g into the vein daily. (Patient not taking: Reported on 07/08/2019)  . Cholecalciferol (VITAMIN D3) 125 MCG (5000 UT) CAPS Take 1 capsule by mouth daily.  Marland Kitchen docusate sodium (COLACE) 100 MG capsule Take 1 capsule (100 mg total) by mouth 2 (two) times daily. To prevent constipation while taking pain medication. (Patient not taking: Reported on 05/11/2019)  . fenofibrate micronized (LOFIBRA) 134 MG capsule Take 134 mg by mouth daily before breakfast.  . ferrous sulfate 325 (65 FE) MG tablet Take 1 tablet (325 mg total) by mouth 3 (three) times daily after meals. (Patient not taking: Reported on 07/08/2019)  . ferrous sulfate 325 (65 FE) MG tablet Take 325 mg by mouth daily with breakfast.  . finasteride (PROSCAR) 5 MG tablet Take 5 mg by mouth daily.  Marland Kitchen glimepiride (AMARYL) 4 MG tablet Take 4 mg by mouth daily with breakfast.  . HYDROcodone-acetaminophen (NORCO/VICODIN) 5-325 MG tablet Take 1 tablet by mouth every 6 (six) hours as needed for moderate pain.  Marland Kitchen levothyroxine (SYNTHROID) 25 MCG tablet Take 25 mcg by mouth daily before breakfast.  . lisinopril (ZESTRIL) 5 MG tablet Take 2.5 mg by mouth daily.   Marland Kitchen loperamide (IMODIUM) 2 MG capsule Take 2 mg by mouth as needed for diarrhea or  loose stools.  Marland Kitchen loratadine (CLARITIN) 10 MG tablet Take 10 mg by mouth daily as needed for allergies.  Marland Kitchen MAGNESIUM OXIDE PO Take 500 mg by mouth 2 (two) times daily.   . methocarbamol (ROBAXIN) 500 MG tablet Take 500 mg by mouth every 6 (six) hours as needed for muscle spasms.  . metoprolol succinate (TOPROL-XL) 25 MG 24 hr tablet Take 25 mg by mouth daily.  . Multiple Vitamin (MULTIVITAMIN) tablet Take 1 tablet by mouth daily.  . Omega-3 Fatty Acids (FISH OIL) 1000 MG CAPS Take 1,000 mg by mouth daily.  . ondansetron (ZOFRAN) 4 MG tablet Take 4 mg by mouth every 8 (eight) hours as needed for nausea or vomiting.  . polyethylene glycol (MIRALAX / GLYCOLAX) 17 g packet Take 17 g by mouth daily.  . Probiotic Product (RISA-BID PROBIOTIC) TABS Take 1  tablet by mouth in the morning and at bedtime.  . rivaroxaban (XARELTO) 10 MG TABS tablet Take 10 mg by mouth daily.  . sodium bicarbonate 650 MG tablet Take 650 mg by mouth 3 (three) times daily.  . tamsulosin (FLOMAX) 0.4 MG CAPS capsule Take 0.4 mg by mouth daily.  . vancomycin (VANCOCIN) 1-5 GM/200ML-% SOLN Inject 200 mLs (1,000 mg total) into the vein daily. (Patient not taking: Reported on 07/08/2019)   No facility-administered encounter medications on file as of 07/08/2019.     Patient Active Problem List   Diagnosis Date Noted  . Medication monitoring encounter 06/15/2019  . PICC (peripherally inserted central catheter) in place 06/15/2019  . Status post hip surgery   . Hematuria, gross 05/29/2019  . Prosthetic joint infection of left hip ([place])   . Benign prostatic hyperplasia   . Left hip prosthetic joint infection ([place]) 05/12/2019  . Pressure injury of skin 05/11/2019  . Septic arthritis ([place]) 05/09/2019  . LV (left ventricular) mural thrombus 05/09/2019  . CKD (chronic kidney disease), stage III 05/09/2019  . Anemia 05/09/2019  . Primary osteoarthritis of left hip 12/03/2018  . Guaiac + stool 06/28/2017  . Diarrhea 08/11/2012  . CAD  (coronary artery disease) of artery bypass graft 08/11/2012  . Diabetes mellitus type 2 with complications ([place]) 76/72/0947  . High cholesterol 08/11/2012     Health Maintenance Due  Topic Date Due  . FOOT EXAM  Never done  . OPHTHALMOLOGY EXAM  Never done  . TETANUS/TDAP  Never done  . PNA vac Low Risk Adult (1 of 2 - PCV13) Never done     Review of Systems  Physical Exam   BP (!) 144/62   Pulse 81   Temp 98 F (36.7 C)   SpO2 99%   gen = a xo by 3 in nad, appears his stated age HEENT = no thrush pulm = ctab no w/c/r Cors = nl s1, s2, no g/m/r SJG:GEZM arm picc line is c/d/i Ext= +1 pitting edema bilaterally CBC Lab Results  Component Value Date   WBC 6.2 06/21/2019   RBC 3.42 (L) 06/21/2019   HGB 9.3 (L) 06/21/2019   HCT 30.5 (L) 06/21/2019   PLT 301 06/21/2019   MCV 89.2 06/21/2019   MCH 27.2 06/21/2019   MCHC 30.5 06/21/2019   RDW 15.7 (H) 06/21/2019   LYMPHSABS 1.5 06/21/2019   MONOABS 0.5 06/21/2019   EOSABS 0.7 (H) 06/21/2019    BMET Lab Results  Component Value Date   NA 141 06/21/2019   K 3.3 (L) 06/21/2019   CL 108 06/21/2019   CO2 24 06/21/2019   GLUCOSE 84 06/21/2019   BUN 17 06/21/2019   CREATININE 1.40 (H) 06/21/2019   CALCIUM 9.9 06/21/2019   GFRNONAA 45 (L) 06/21/2019   GFRAA 52 (L) 06/21/2019    Lab Results  Component Value Date   ESRSEDRATE 50 (H) 07/08/2019   Lab Results  Component Value Date   CRP 61.2 (H) 07/08/2019     Assessment and Plan  Anasarca/ LE edema = recommend to improve nutrition, protein intake   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 Will have abtx extending through 6/12 -- roughly the 6 wk mark. And consider starting on oral abtx since unclear when they will do/if do 2nd surgery  We will see back in 2-3 wks

## 2019-07-09 LAB — SEDIMENTATION RATE: Sed Rate: 50 mm/h — ABNORMAL HIGH (ref 0–20)

## 2019-07-09 LAB — C-REACTIVE PROTEIN: CRP: 61.2 mg/L — ABNORMAL HIGH (ref <8.0)

## 2019-07-13 DIAGNOSIS — D631 Anemia in chronic kidney disease: Secondary | ICD-10-CM | POA: Diagnosis not present

## 2019-07-13 DIAGNOSIS — M009 Pyogenic arthritis, unspecified: Secondary | ICD-10-CM | POA: Diagnosis not present

## 2019-07-13 DIAGNOSIS — E119 Type 2 diabetes mellitus without complications: Secondary | ICD-10-CM | POA: Diagnosis not present

## 2019-07-13 DIAGNOSIS — I513 Intracardiac thrombosis, not elsewhere classified: Secondary | ICD-10-CM | POA: Diagnosis not present

## 2019-07-13 DIAGNOSIS — I129 Hypertensive chronic kidney disease with stage 1 through stage 4 chronic kidney disease, or unspecified chronic kidney disease: Secondary | ICD-10-CM | POA: Diagnosis not present

## 2019-07-13 DIAGNOSIS — N183 Chronic kidney disease, stage 3 unspecified: Secondary | ICD-10-CM | POA: Diagnosis not present

## 2019-07-13 DIAGNOSIS — Z7984 Long term (current) use of oral hypoglycemic drugs: Secondary | ICD-10-CM | POA: Diagnosis not present

## 2019-07-13 DIAGNOSIS — R262 Difficulty in walking, not elsewhere classified: Secondary | ICD-10-CM | POA: Diagnosis not present

## 2019-07-13 DIAGNOSIS — T8452XA Infection and inflammatory reaction due to internal left hip prosthesis, initial encounter: Secondary | ICD-10-CM | POA: Diagnosis not present

## 2019-07-13 DIAGNOSIS — B951 Streptococcus, group B, as the cause of diseases classified elsewhere: Secondary | ICD-10-CM | POA: Diagnosis not present

## 2019-07-14 NOTE — Progress Notes (Deleted)
Cardiology Office Note   Date:  07/14/2019   ID:  Casey [name], DOB [birth year], MRN [id number]  PCP:  Patient, No Pcp Per  Cardiologist:   Dorris Carnes, MD       History of Present Illness: Casey [name] is a 86 y.o. male with a history of       No outpatient medications have been marked as taking for the 07/15/19 encounter (Appointment) with Fay Records, MD.     Allergies:   Sulfa antibiotics and Sulfa antibiotics   Past Medical History:  Diagnosis Date   Anemia    Arthritis    "all over" all joints   Benign prostatic hyperplasia    Bladder cancer ([place])    bladder   Blood transfusion 2008   CAD (coronary artery disease) of artery bypass graft 08/11/2012   Chronic kidney disease    bladder ca in 2008;Kid. failure stage 3   CKD (chronic kidney disease), stage III 0/34/7425   Complication of anesthesia    Ilius post op   Coronary artery disease    CVA (cerebral vascular accident) ([place]) 03/04/2019   Diabetes mellitus    Diabetes mellitus type 2 with complications ([place]) 9/56/3875   Diarrhea 08/11/2012   DJD (degenerative joint disease)    Guaiac + stool 06/28/2017   Heart murmur    Hematuria, gross 05/29/2019   High cholesterol 08/11/2012   Hypothyroidism    hyper thyroidism recently   Ileus ([place]) 2001   post hip replacement   Left hip prosthetic joint infection ([place]) 05/12/2019   LV (left ventricular) mural thrombus 05/09/2019   Medication monitoring encounter 6/43/3295   Metabolic acidosis    Metabolic bone disease    Myocardial infarction [facility]) age 89   PICC (peripherally inserted central catheter) in place 06/15/2019   Pressure injury of skin 05/11/2019   Primary osteoarthritis of left hip 12/03/2018   Prosthetic joint infection of left hip ([place])    Septic arthritis ([place]) 05/09/2019   Status post hip surgery     Past Surgical History:  Procedure Laterality Date   BLADDER SURGERY  2008   CATARACT EXTRACTION W/  INTRAOCULAR LENS  IMPLANT, BILATERAL Bilateral    CHOLECYSTECTOMY     COLONOSCOPY  09/07/2010   Procedure: COLONOSCOPY;  Surgeon: Rogene Houston, MD;  Location: [facility];  Service: Endoscopy;  Laterality: N/A;   CORONARY ANGIOPLASTY     INGUINAL HERNIA REPAIR  2005   REIMPLANTATION OF CEMENT SPACER HIP Left 06/01/2019   Procedure: REIMPLANTATION OF CEMENT SPACER HIP;  Surgeon: Paralee Cancel, MD;  Location: [facility] OR[facility];  Service: Orthopedics;  Laterality: Left;   TOTAL HIP ARTHROPLASTY Left 12/23/2018   Procedure: TOTAL HIP ARTHROPLASTY ANTERIOR APPROACH;  Surgeon: Renette Butters, MD;  Location: [facility];  Service: Orthopedics;  Laterality: Left;   TOTAL HIP ARTHROPLASTY Left 05/12/2019   Procedure: irrigation and debridement septic hip headliner exchange;  Surgeon: Renette Butters, MD;  Location: [facility];  Service: Orthopedics;  Laterality: Left;  wants hana table   TOTAL HIP ARTHROPLASTY WITH HARDWARE REMOVAL Left 06/01/2019   Procedure: TOTAL HIP ARTHROPLASTY WITH HARDWARE REMOVAL, A;  Surgeon: Paralee Cancel, MD;  Location: [facility];  Service: Orthopedics;  Laterality: Left;  Antibiotic  spacer, posterior approach lateral with peg board     Social History:  The patient  reports that he quit smoking about 28 years ago. He has never used smokeless tobacco. He reports current alcohol use. He reports that  he does not use drugs.   Family History:  The patient's family history includes CAD in his father and mother.    ROS:  Please see the history of present illness. All other systems are reviewed and  Negative to the above problem except as noted.    PHYSICAL EXAM: VS:  There were no vitals taken for this visit.  GEN: Well nourished, well developed, in no acute distress  HEENT: normal  Neck: no JVD, carotid bruits, or masses Cardiac: RRR; no murmurs, rubs, or gallops,no edema  Respiratory:  clear to auscultation bilaterally, normal work of breathing GI: soft, nontender, nondistended, +  BS  No hepatomegaly  MS: no deformity Moving all extremities   Skin: warm and dry, no rash Neuro:  Strength and sensation are intact Psych: euthymic mood, full affect   EKG:  EKG is ordered today.   Lipid Panel No results found for: CHOL, TRIG, HDL, CHOLHDL, VLDL, LDLCALC, LDLDIRECT    Wt Readings from Last 3 Encounters:  06/21/19 160 lb (72.6 kg)  06/04/19 171 lb 1.2 oz (77.6 kg)  05/12/19 160 lb (72.6 kg)      ASSESSMENT AND PLAN:     Current medicines are reviewed at length with the patient today.  The patient does not have concerns regarding medicines.  Signed, Dorris Carnes, MD  07/14/2019 9:50 PM    [facility] [place], [place], [place]  [address] Phone: [phone]; Fax: [phone]

## 2019-07-15 ENCOUNTER — Ambulatory Visit: Payer: Medicare Other | Admitting: Internal Medicine

## 2019-07-15 DIAGNOSIS — T8452XA Infection and inflammatory reaction due to internal left hip prosthesis, initial encounter: Secondary | ICD-10-CM | POA: Diagnosis not present

## 2019-07-15 DIAGNOSIS — D631 Anemia in chronic kidney disease: Secondary | ICD-10-CM | POA: Diagnosis not present

## 2019-07-15 DIAGNOSIS — R262 Difficulty in walking, not elsewhere classified: Secondary | ICD-10-CM | POA: Diagnosis not present

## 2019-07-15 DIAGNOSIS — M009 Pyogenic arthritis, unspecified: Secondary | ICD-10-CM | POA: Diagnosis not present

## 2019-07-15 DIAGNOSIS — I513 Intracardiac thrombosis, not elsewhere classified: Secondary | ICD-10-CM | POA: Diagnosis not present

## 2019-07-15 DIAGNOSIS — B951 Streptococcus, group B, as the cause of diseases classified elsewhere: Secondary | ICD-10-CM | POA: Diagnosis not present

## 2019-07-15 DIAGNOSIS — E119 Type 2 diabetes mellitus without complications: Secondary | ICD-10-CM | POA: Diagnosis not present

## 2019-07-15 DIAGNOSIS — Z7984 Long term (current) use of oral hypoglycemic drugs: Secondary | ICD-10-CM | POA: Diagnosis not present

## 2019-07-15 DIAGNOSIS — N183 Chronic kidney disease, stage 3 unspecified: Secondary | ICD-10-CM | POA: Diagnosis not present

## 2019-07-15 DIAGNOSIS — I129 Hypertensive chronic kidney disease with stage 1 through stage 4 chronic kidney disease, or unspecified chronic kidney disease: Secondary | ICD-10-CM | POA: Diagnosis not present

## 2019-07-16 DIAGNOSIS — Z7984 Long term (current) use of oral hypoglycemic drugs: Secondary | ICD-10-CM | POA: Diagnosis not present

## 2019-07-16 DIAGNOSIS — N183 Chronic kidney disease, stage 3 unspecified: Secondary | ICD-10-CM | POA: Diagnosis not present

## 2019-07-16 DIAGNOSIS — E119 Type 2 diabetes mellitus without complications: Secondary | ICD-10-CM | POA: Diagnosis not present

## 2019-07-16 DIAGNOSIS — M009 Pyogenic arthritis, unspecified: Secondary | ICD-10-CM | POA: Diagnosis not present

## 2019-07-16 DIAGNOSIS — I513 Intracardiac thrombosis, not elsewhere classified: Secondary | ICD-10-CM | POA: Diagnosis not present

## 2019-07-16 DIAGNOSIS — B951 Streptococcus, group B, as the cause of diseases classified elsewhere: Secondary | ICD-10-CM | POA: Diagnosis not present

## 2019-07-16 DIAGNOSIS — R262 Difficulty in walking, not elsewhere classified: Secondary | ICD-10-CM | POA: Diagnosis not present

## 2019-07-16 DIAGNOSIS — I129 Hypertensive chronic kidney disease with stage 1 through stage 4 chronic kidney disease, or unspecified chronic kidney disease: Secondary | ICD-10-CM | POA: Diagnosis not present

## 2019-07-16 DIAGNOSIS — D631 Anemia in chronic kidney disease: Secondary | ICD-10-CM | POA: Diagnosis not present

## 2019-07-16 DIAGNOSIS — T8452XA Infection and inflammatory reaction due to internal left hip prosthesis, initial encounter: Secondary | ICD-10-CM | POA: Diagnosis not present

## 2019-07-17 DIAGNOSIS — D631 Anemia in chronic kidney disease: Secondary | ICD-10-CM | POA: Diagnosis not present

## 2019-07-17 DIAGNOSIS — R262 Difficulty in walking, not elsewhere classified: Secondary | ICD-10-CM | POA: Diagnosis not present

## 2019-07-17 DIAGNOSIS — M009 Pyogenic arthritis, unspecified: Secondary | ICD-10-CM | POA: Diagnosis not present

## 2019-07-17 DIAGNOSIS — I10 Essential (primary) hypertension: Secondary | ICD-10-CM | POA: Diagnosis not present

## 2019-07-17 DIAGNOSIS — E1122 Type 2 diabetes mellitus with diabetic chronic kidney disease: Secondary | ICD-10-CM | POA: Diagnosis not present

## 2019-07-17 DIAGNOSIS — I513 Intracardiac thrombosis, not elsewhere classified: Secondary | ICD-10-CM | POA: Diagnosis not present

## 2019-07-17 DIAGNOSIS — E1165 Type 2 diabetes mellitus with hyperglycemia: Secondary | ICD-10-CM | POA: Diagnosis not present

## 2019-07-17 DIAGNOSIS — B951 Streptococcus, group B, as the cause of diseases classified elsewhere: Secondary | ICD-10-CM | POA: Diagnosis not present

## 2019-07-17 DIAGNOSIS — I129 Hypertensive chronic kidney disease with stage 1 through stage 4 chronic kidney disease, or unspecified chronic kidney disease: Secondary | ICD-10-CM | POA: Diagnosis not present

## 2019-07-17 DIAGNOSIS — N183 Chronic kidney disease, stage 3 unspecified: Secondary | ICD-10-CM | POA: Diagnosis not present

## 2019-07-17 DIAGNOSIS — E119 Type 2 diabetes mellitus without complications: Secondary | ICD-10-CM | POA: Diagnosis not present

## 2019-07-17 DIAGNOSIS — T8452XA Infection and inflammatory reaction due to internal left hip prosthesis, initial encounter: Secondary | ICD-10-CM | POA: Diagnosis not present

## 2019-07-17 DIAGNOSIS — Z299 Encounter for prophylactic measures, unspecified: Secondary | ICD-10-CM | POA: Diagnosis not present

## 2019-07-17 DIAGNOSIS — Z7984 Long term (current) use of oral hypoglycemic drugs: Secondary | ICD-10-CM | POA: Diagnosis not present

## 2019-07-20 DIAGNOSIS — T8452XA Infection and inflammatory reaction due to internal left hip prosthesis, initial encounter: Secondary | ICD-10-CM | POA: Diagnosis not present

## 2019-07-20 DIAGNOSIS — M009 Pyogenic arthritis, unspecified: Secondary | ICD-10-CM | POA: Diagnosis not present

## 2019-07-20 DIAGNOSIS — R262 Difficulty in walking, not elsewhere classified: Secondary | ICD-10-CM | POA: Diagnosis not present

## 2019-07-20 DIAGNOSIS — Z7984 Long term (current) use of oral hypoglycemic drugs: Secondary | ICD-10-CM | POA: Diagnosis not present

## 2019-07-20 DIAGNOSIS — N183 Chronic kidney disease, stage 3 unspecified: Secondary | ICD-10-CM | POA: Diagnosis not present

## 2019-07-20 DIAGNOSIS — D631 Anemia in chronic kidney disease: Secondary | ICD-10-CM | POA: Diagnosis not present

## 2019-07-20 DIAGNOSIS — I129 Hypertensive chronic kidney disease with stage 1 through stage 4 chronic kidney disease, or unspecified chronic kidney disease: Secondary | ICD-10-CM | POA: Diagnosis not present

## 2019-07-20 DIAGNOSIS — I513 Intracardiac thrombosis, not elsewhere classified: Secondary | ICD-10-CM | POA: Diagnosis not present

## 2019-07-20 DIAGNOSIS — E119 Type 2 diabetes mellitus without complications: Secondary | ICD-10-CM | POA: Diagnosis not present

## 2019-07-20 DIAGNOSIS — B951 Streptococcus, group B, as the cause of diseases classified elsewhere: Secondary | ICD-10-CM | POA: Diagnosis not present

## 2019-07-21 DIAGNOSIS — N183 Chronic kidney disease, stage 3 unspecified: Secondary | ICD-10-CM | POA: Diagnosis not present

## 2019-07-21 DIAGNOSIS — Z7984 Long term (current) use of oral hypoglycemic drugs: Secondary | ICD-10-CM | POA: Diagnosis not present

## 2019-07-21 DIAGNOSIS — D631 Anemia in chronic kidney disease: Secondary | ICD-10-CM | POA: Diagnosis not present

## 2019-07-21 DIAGNOSIS — I129 Hypertensive chronic kidney disease with stage 1 through stage 4 chronic kidney disease, or unspecified chronic kidney disease: Secondary | ICD-10-CM | POA: Diagnosis not present

## 2019-07-21 DIAGNOSIS — M009 Pyogenic arthritis, unspecified: Secondary | ICD-10-CM | POA: Diagnosis not present

## 2019-07-21 DIAGNOSIS — E119 Type 2 diabetes mellitus without complications: Secondary | ICD-10-CM | POA: Diagnosis not present

## 2019-07-21 DIAGNOSIS — R262 Difficulty in walking, not elsewhere classified: Secondary | ICD-10-CM | POA: Diagnosis not present

## 2019-07-21 DIAGNOSIS — I513 Intracardiac thrombosis, not elsewhere classified: Secondary | ICD-10-CM | POA: Diagnosis not present

## 2019-07-21 DIAGNOSIS — T8452XA Infection and inflammatory reaction due to internal left hip prosthesis, initial encounter: Secondary | ICD-10-CM | POA: Diagnosis not present

## 2019-07-21 DIAGNOSIS — B951 Streptococcus, group B, as the cause of diseases classified elsewhere: Secondary | ICD-10-CM | POA: Diagnosis not present

## 2019-07-22 ENCOUNTER — Telehealth: Payer: Self-pay

## 2019-07-22 ENCOUNTER — Other Ambulatory Visit (HOSPITAL_COMMUNITY)
Admission: RE | Admit: 2019-07-22 | Discharge: 2019-07-22 | Disposition: A | Discharge status: Home / Self Care | Payer: Medicare Other | Source: Other Acute Inpatient Hospital | Attending: Cardiology | Admitting: Cardiology

## 2019-07-22 DIAGNOSIS — E119 Type 2 diabetes mellitus without complications: Secondary | ICD-10-CM | POA: Diagnosis not present

## 2019-07-22 DIAGNOSIS — I129 Hypertensive chronic kidney disease with stage 1 through stage 4 chronic kidney disease, or unspecified chronic kidney disease: Secondary | ICD-10-CM | POA: Diagnosis not present

## 2019-07-22 DIAGNOSIS — D631 Anemia in chronic kidney disease: Secondary | ICD-10-CM | POA: Diagnosis not present

## 2019-07-22 DIAGNOSIS — Z7984 Long term (current) use of oral hypoglycemic drugs: Secondary | ICD-10-CM | POA: Diagnosis not present

## 2019-07-22 DIAGNOSIS — B951 Streptococcus, group B, as the cause of diseases classified elsewhere: Secondary | ICD-10-CM | POA: Diagnosis not present

## 2019-07-22 DIAGNOSIS — T8452XA Infection and inflammatory reaction due to internal left hip prosthesis, initial encounter: Secondary | ICD-10-CM | POA: Diagnosis not present

## 2019-07-22 DIAGNOSIS — I513 Intracardiac thrombosis, not elsewhere classified: Secondary | ICD-10-CM | POA: Diagnosis not present

## 2019-07-22 DIAGNOSIS — N183 Chronic kidney disease, stage 3 unspecified: Secondary | ICD-10-CM | POA: Insufficient documentation

## 2019-07-22 DIAGNOSIS — M009 Pyogenic arthritis, unspecified: Secondary | ICD-10-CM | POA: Diagnosis not present

## 2019-07-22 DIAGNOSIS — R262 Difficulty in walking, not elsewhere classified: Secondary | ICD-10-CM | POA: Diagnosis not present

## 2019-07-22 LAB — COMPREHENSIVE METABOLIC PANEL
ALT: 12 U/L (ref 0–44)
AST: 18 U/L (ref 15–41)
Albumin: 3.7 g/dL (ref 3.5–5.0)
Alkaline Phosphatase: 42 U/L (ref 38–126)
Anion gap: 12 (ref 5–15)
BUN: 72 mg/dL — ABNORMAL HIGH (ref 8–23)
CO2: 24 mmol/L (ref 22–32)
Calcium: 10.4 mg/dL — ABNORMAL HIGH (ref 8.9–10.3)
Chloride: 95 mmol/L — ABNORMAL LOW (ref 98–111)
Creatinine, Ser: 2.06 mg/dL — ABNORMAL HIGH (ref 0.61–1.24)
GFR calc Af Amer: 33 mL/min — ABNORMAL LOW (ref >60)
GFR calc non Af Amer: 28 mL/min — ABNORMAL LOW (ref >60)
Glucose, Bld: 122 mg/dL — ABNORMAL HIGH (ref 70–99)
Potassium: 4.7 mmol/L (ref 3.5–5.1)
Sodium: 131 mmol/L — ABNORMAL LOW (ref 135–145)
Total Bilirubin: 0.6 mg/dL (ref 0.3–1.2)
Total Protein: 6.7 g/dL (ref 6.5–8.1)

## 2019-07-22 LAB — CBC
HCT: 34.5 % — ABNORMAL LOW (ref 39.0–52.0)
Hemoglobin: 11 g/dL — ABNORMAL LOW (ref 13.0–17.0)
MCH: 26.6 pg (ref 26.0–34.0)
MCHC: 31.9 g/dL (ref 30.0–36.0)
MCV: 83.3 fL (ref 80.0–100.0)
Platelets: 400 10*3/uL (ref 150–400)
RBC: 4.14 MIL/uL — ABNORMAL LOW (ref 4.22–5.81)
RDW: 15.9 % — ABNORMAL HIGH (ref 11.5–15.5)
WBC: 7.6 10*3/uL (ref 4.0–10.5)
nRBC: 0 % (ref 0.0–0.2)

## 2019-07-22 NOTE — Telephone Encounter (Signed)
Attemped to call [facility] nurse back . Left her a voicemail regarding status of patient and to add Sed rate, CRP today.  Casey [name]

## 2019-07-22 NOTE — Telephone Encounter (Signed)
Home health RN returned call; regular labs ordered (CBC with Diff + CMP) and drawn. Dropped off at [facility] to be ran STAT. RN also called to add ESR + CRP; states shes waiting to hear back if they're able to add them to the samples. If not, she's willing to return to the home to draw the additional labs.   She does report that the patient is complaining of increased pain to the hip area. No redness noted. Daughter reports that she has noticed that his appetite has decreased.   Andreka Stucki Lorita Officer, RN

## 2019-07-22 NOTE — Telephone Encounter (Signed)
Received VM in triage from [facility] nurse states patient was having some increased pain and was very restless.  [place] nurse called PCP and ordered CMP and CBC. [place] nurse has not yet arrived at the home but will updated [facility] on patient's status when she arrived. LPN will alert Dr. Baxter Flattery to see if she want to add additional labs. [name]

## 2019-07-23 DIAGNOSIS — D631 Anemia in chronic kidney disease: Secondary | ICD-10-CM | POA: Diagnosis not present

## 2019-07-23 DIAGNOSIS — N183 Chronic kidney disease, stage 3 unspecified: Secondary | ICD-10-CM | POA: Diagnosis not present

## 2019-07-23 DIAGNOSIS — M009 Pyogenic arthritis, unspecified: Secondary | ICD-10-CM | POA: Diagnosis not present

## 2019-07-23 DIAGNOSIS — R262 Difficulty in walking, not elsewhere classified: Secondary | ICD-10-CM | POA: Diagnosis not present

## 2019-07-23 DIAGNOSIS — I513 Intracardiac thrombosis, not elsewhere classified: Secondary | ICD-10-CM | POA: Diagnosis not present

## 2019-07-23 DIAGNOSIS — I129 Hypertensive chronic kidney disease with stage 1 through stage 4 chronic kidney disease, or unspecified chronic kidney disease: Secondary | ICD-10-CM | POA: Diagnosis not present

## 2019-07-23 DIAGNOSIS — T8452XA Infection and inflammatory reaction due to internal left hip prosthesis, initial encounter: Secondary | ICD-10-CM | POA: Diagnosis not present

## 2019-07-23 DIAGNOSIS — E119 Type 2 diabetes mellitus without complications: Secondary | ICD-10-CM | POA: Diagnosis not present

## 2019-07-23 DIAGNOSIS — Z7984 Long term (current) use of oral hypoglycemic drugs: Secondary | ICD-10-CM | POA: Diagnosis not present

## 2019-07-23 DIAGNOSIS — Z5181 Encounter for therapeutic drug level monitoring: Secondary | ICD-10-CM | POA: Diagnosis not present

## 2019-07-23 DIAGNOSIS — B951 Streptococcus, group B, as the cause of diseases classified elsewhere: Secondary | ICD-10-CM | POA: Diagnosis not present

## 2019-07-24 ENCOUNTER — Other Ambulatory Visit: Payer: Self-pay | Admitting: Internal Medicine

## 2019-07-24 DIAGNOSIS — M009 Pyogenic arthritis, unspecified: Secondary | ICD-10-CM | POA: Diagnosis not present

## 2019-07-24 DIAGNOSIS — Z7984 Long term (current) use of oral hypoglycemic drugs: Secondary | ICD-10-CM | POA: Diagnosis not present

## 2019-07-24 DIAGNOSIS — I10 Essential (primary) hypertension: Secondary | ICD-10-CM | POA: Diagnosis not present

## 2019-07-24 DIAGNOSIS — E119 Type 2 diabetes mellitus without complications: Secondary | ICD-10-CM | POA: Diagnosis not present

## 2019-07-24 DIAGNOSIS — R262 Difficulty in walking, not elsewhere classified: Secondary | ICD-10-CM | POA: Diagnosis not present

## 2019-07-24 DIAGNOSIS — B951 Streptococcus, group B, as the cause of diseases classified elsewhere: Secondary | ICD-10-CM | POA: Diagnosis not present

## 2019-07-24 DIAGNOSIS — D631 Anemia in chronic kidney disease: Secondary | ICD-10-CM | POA: Diagnosis not present

## 2019-07-24 DIAGNOSIS — I129 Hypertensive chronic kidney disease with stage 1 through stage 4 chronic kidney disease, or unspecified chronic kidney disease: Secondary | ICD-10-CM | POA: Diagnosis not present

## 2019-07-24 DIAGNOSIS — N183 Chronic kidney disease, stage 3 unspecified: Secondary | ICD-10-CM | POA: Diagnosis not present

## 2019-07-24 DIAGNOSIS — Z299 Encounter for prophylactic measures, unspecified: Secondary | ICD-10-CM | POA: Diagnosis not present

## 2019-07-24 DIAGNOSIS — T8452XA Infection and inflammatory reaction due to internal left hip prosthesis, initial encounter: Secondary | ICD-10-CM | POA: Diagnosis not present

## 2019-07-24 DIAGNOSIS — G4700 Insomnia, unspecified: Secondary | ICD-10-CM | POA: Diagnosis not present

## 2019-07-24 DIAGNOSIS — I513 Intracardiac thrombosis, not elsewhere classified: Secondary | ICD-10-CM | POA: Diagnosis not present

## 2019-07-24 MED ORDER — CEPHALEXIN 500 MG PO CAPS: 500.0000 mg | ORAL_CAPSULE | Freq: Three times a day (TID) | ORAL | 1 refills | Status: AC

## 2019-07-24 NOTE — Telephone Encounter (Signed)
Patient's daughter, Gilford Silvius, returned call for update on patient. Reports that he has deteriorated significantly over the last week or so; worsening cognitive function, and appetite. Daughter reports that she is interested in getting hospice care involved. States that he is becoming 24/7 care however they aren't interested in returning to the hospital. Informed her of prescription that was sent in and instructed to take imodium with it as he is prone to diarrhea.   Requested Dr. Baxter Flattery to call her back today if possible to discuss care.   Christropher Gintz Lorita Officer, RN

## 2019-07-24 NOTE — Telephone Encounter (Signed)
I left voicemail on patient's cell. Can you call him/or daughter to see how he is doing. I have called in keflex 500mg  tid. Can you make sure they pick it up. Can we have him come in on Monday as a double book.

## 2019-07-24 NOTE — Telephone Encounter (Signed)
Left daughter a voicemail requesting a call back and to check in about patient.   Please schedule patient on Monday with Dr. Baxter Flattery as double book. Thanks!  Julie Paolini Lorita Officer, RN

## 2019-07-27 DIAGNOSIS — N183 Chronic kidney disease, stage 3 unspecified: Secondary | ICD-10-CM | POA: Diagnosis not present

## 2019-07-27 DIAGNOSIS — R319 Hematuria, unspecified: Secondary | ICD-10-CM | POA: Diagnosis not present

## 2019-07-27 DIAGNOSIS — M009 Pyogenic arthritis, unspecified: Secondary | ICD-10-CM | POA: Diagnosis not present

## 2019-07-27 DIAGNOSIS — Z7984 Long term (current) use of oral hypoglycemic drugs: Secondary | ICD-10-CM | POA: Diagnosis not present

## 2019-07-27 DIAGNOSIS — T8452XA Infection and inflammatory reaction due to internal left hip prosthesis, initial encounter: Secondary | ICD-10-CM | POA: Diagnosis not present

## 2019-07-27 DIAGNOSIS — I129 Hypertensive chronic kidney disease with stage 1 through stage 4 chronic kidney disease, or unspecified chronic kidney disease: Secondary | ICD-10-CM | POA: Diagnosis not present

## 2019-07-27 DIAGNOSIS — I513 Intracardiac thrombosis, not elsewhere classified: Secondary | ICD-10-CM | POA: Diagnosis not present

## 2019-07-27 DIAGNOSIS — E119 Type 2 diabetes mellitus without complications: Secondary | ICD-10-CM | POA: Diagnosis not present

## 2019-07-27 DIAGNOSIS — D631 Anemia in chronic kidney disease: Secondary | ICD-10-CM | POA: Diagnosis not present

## 2019-07-27 DIAGNOSIS — B951 Streptococcus, group B, as the cause of diseases classified elsewhere: Secondary | ICD-10-CM | POA: Diagnosis not present

## 2019-07-27 DIAGNOSIS — R262 Difficulty in walking, not elsewhere classified: Secondary | ICD-10-CM | POA: Diagnosis not present

## 2019-07-27 DIAGNOSIS — R309 Painful micturition, unspecified: Secondary | ICD-10-CM | POA: Diagnosis not present

## 2019-07-29 DIAGNOSIS — I129 Hypertensive chronic kidney disease with stage 1 through stage 4 chronic kidney disease, or unspecified chronic kidney disease: Secondary | ICD-10-CM | POA: Diagnosis not present

## 2019-07-29 DIAGNOSIS — I513 Intracardiac thrombosis, not elsewhere classified: Secondary | ICD-10-CM | POA: Diagnosis not present

## 2019-07-29 DIAGNOSIS — B951 Streptococcus, group B, as the cause of diseases classified elsewhere: Secondary | ICD-10-CM | POA: Diagnosis not present

## 2019-07-29 DIAGNOSIS — Z7984 Long term (current) use of oral hypoglycemic drugs: Secondary | ICD-10-CM | POA: Diagnosis not present

## 2019-07-29 DIAGNOSIS — D631 Anemia in chronic kidney disease: Secondary | ICD-10-CM | POA: Diagnosis not present

## 2019-07-29 DIAGNOSIS — R262 Difficulty in walking, not elsewhere classified: Secondary | ICD-10-CM | POA: Diagnosis not present

## 2019-07-29 DIAGNOSIS — M009 Pyogenic arthritis, unspecified: Secondary | ICD-10-CM | POA: Diagnosis not present

## 2019-07-29 DIAGNOSIS — N183 Chronic kidney disease, stage 3 unspecified: Secondary | ICD-10-CM | POA: Diagnosis not present

## 2019-07-29 DIAGNOSIS — E119 Type 2 diabetes mellitus without complications: Secondary | ICD-10-CM | POA: Diagnosis not present

## 2019-07-29 DIAGNOSIS — T8452XA Infection and inflammatory reaction due to internal left hip prosthesis, initial encounter: Secondary | ICD-10-CM | POA: Diagnosis not present

## 2019-08-10 ENCOUNTER — Ambulatory Visit (INDEPENDENT_AMBULATORY_CARE_PROVIDER_SITE_OTHER): Payer: Medicare Other | Admitting: Gastroenterology

## 2019-08-10 DIAGNOSIS — T8452XD Infection and inflammatory reaction due to internal left hip prosthesis, subsequent encounter: Secondary | ICD-10-CM | POA: Diagnosis not present

## 2019-08-30 DEATH — deceased

## 2021-04-10 IMAGING — CT CT HIP*L* W/O CM
2 of 3 series · 17 of 46 positions shown, 19 images · non-contrast
Comparison: May 28, 2019 radiograph, CT May 08, 2019

CLINICAL DATA: Hip replacement, question of infection, high fevers

EXAM:
CT OF THE LEFT HIP WITHOUT CONTRAST
TECHNIQUE: Multidetector CT imaging of the left hip was performed according to
the standard protocol. Multiplanar CT image reconstructions were
also generated.

[Series 3: axial (person_name) · axial · 0.47mm/px · z∈[+142,+326]mm · 14 of 106 slices shown, 16 images]
[im 7/106  soft-tissue]
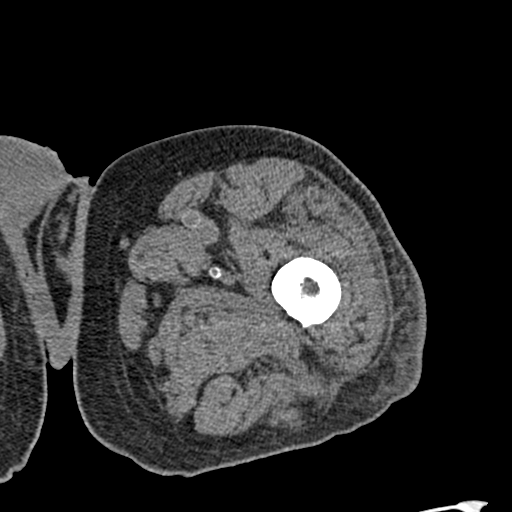
[im 7/106  bone]
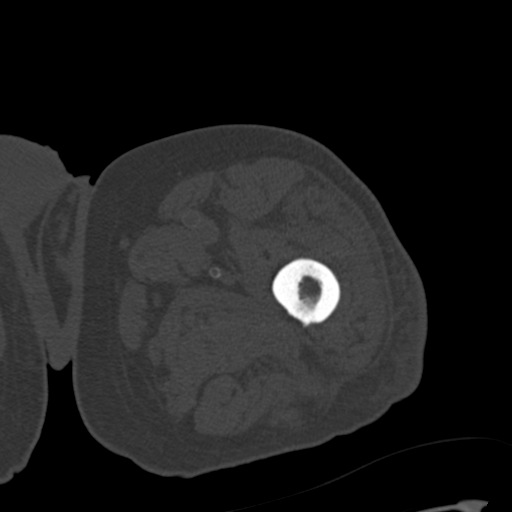
[im 14/106  soft-tissue]
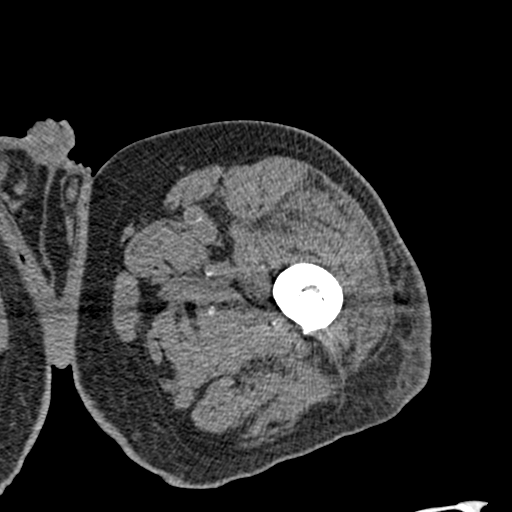
[im 21/106  soft-tissue]
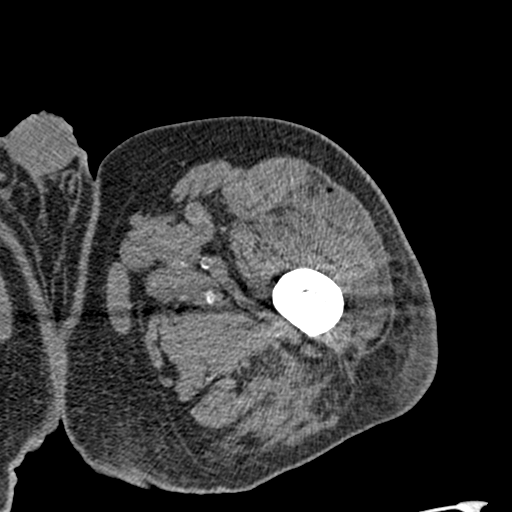
[im 28/106  soft-tissue]
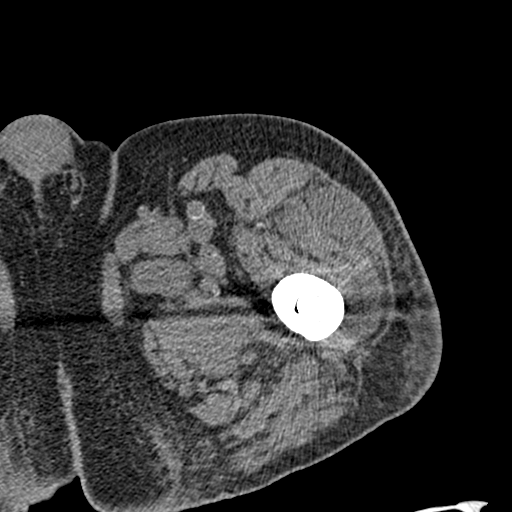
[im 34/106  soft-tissue]
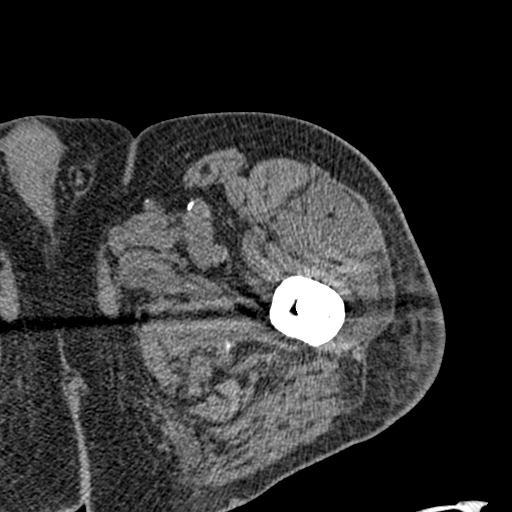
[im 41/106  soft-tissue]
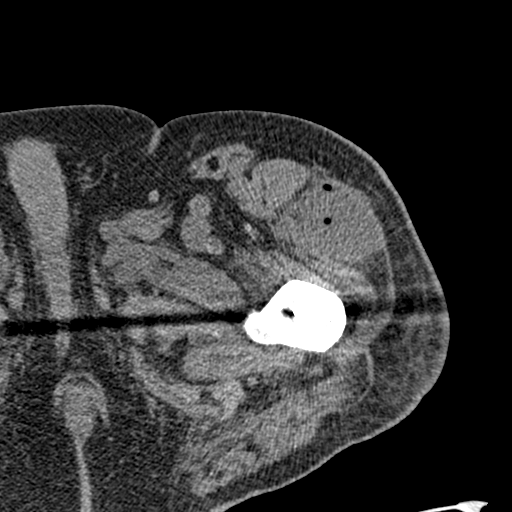
[im 48/106  soft-tissue]
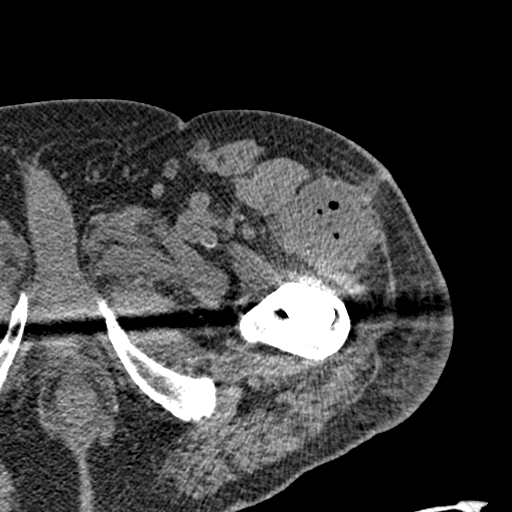
[im 58/106  soft-tissue]
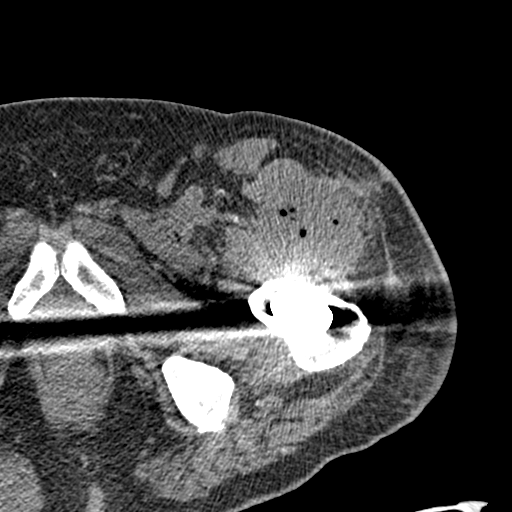
[im 65/106  soft-tissue]
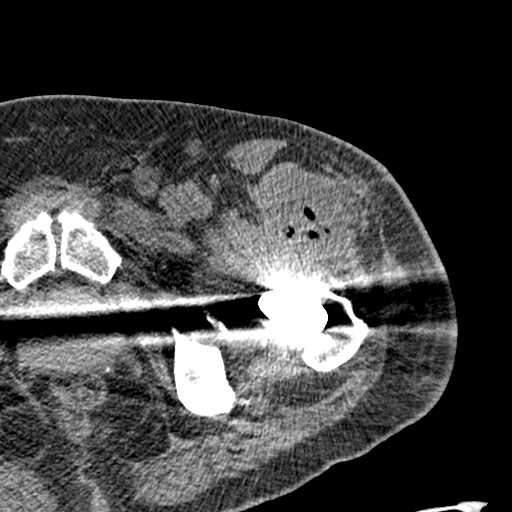
[im 65/106  bone]
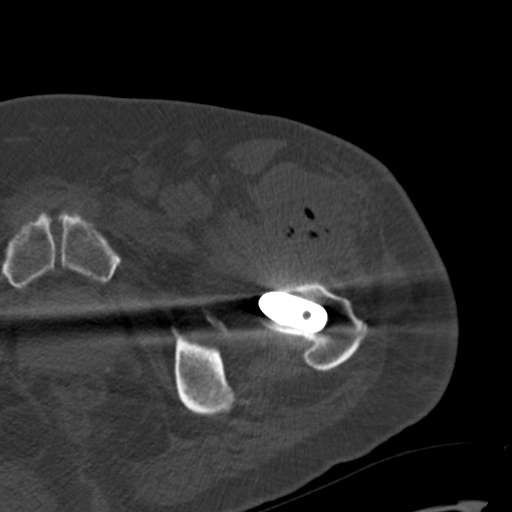
[im 72/106  soft-tissue]
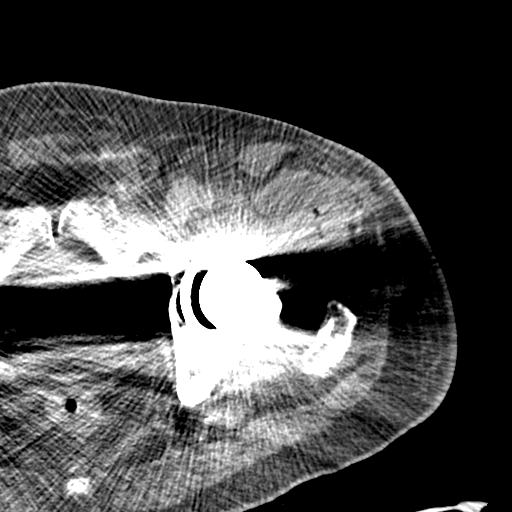
[im 78/106  soft-tissue]
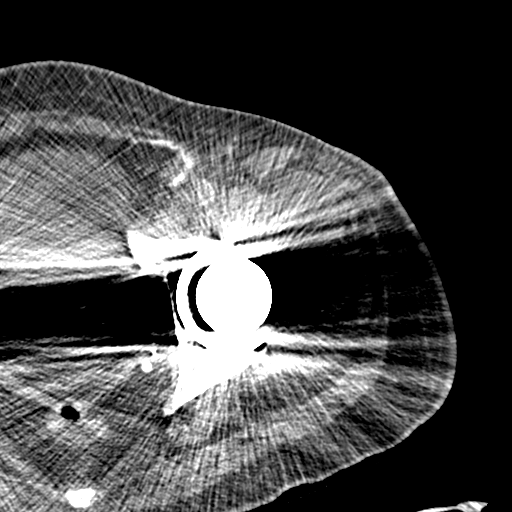
[im 85/106  soft-tissue]
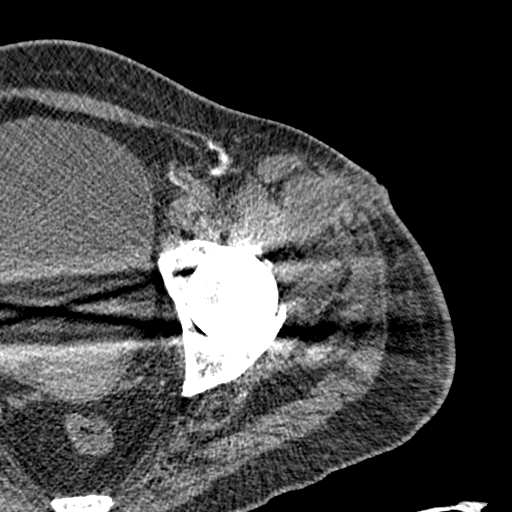
[im 92/106  soft-tissue]
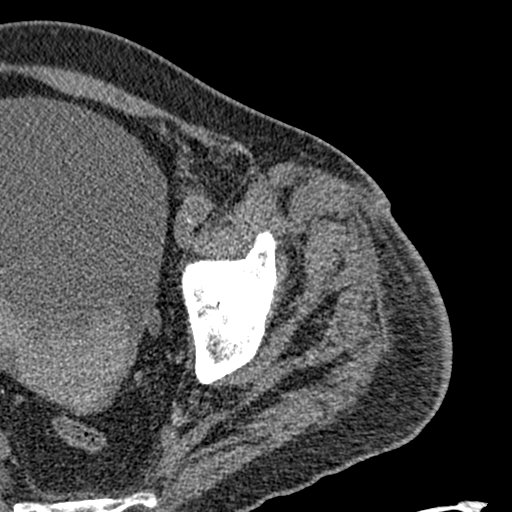
[im 99/106  soft-tissue]
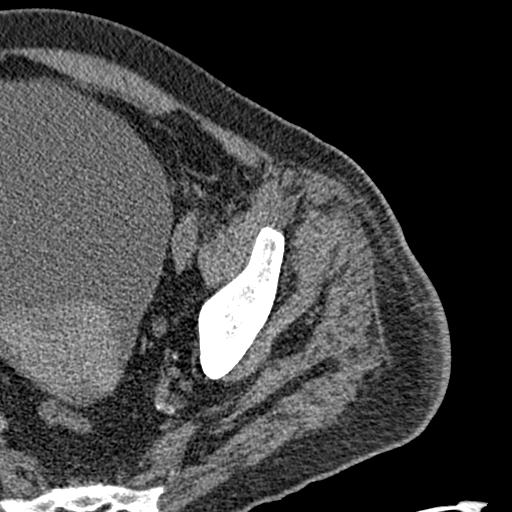

[Series 8: coronal st · coronal · 0.41mm/px · 3 of 103 slices shown]
[im 35/103  soft-tissue]
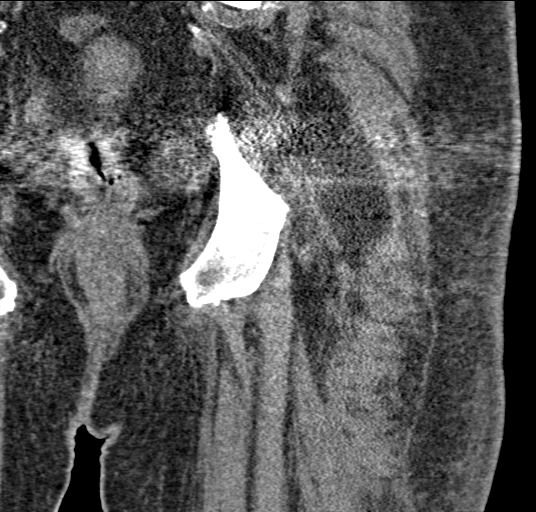
[im 46/103  soft-tissue]
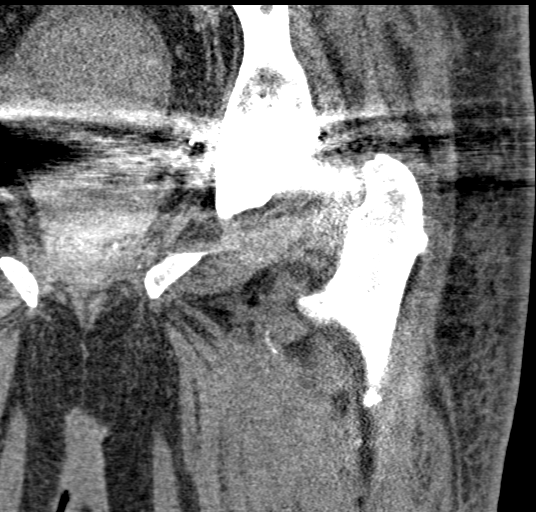
[im 57/103  soft-tissue]
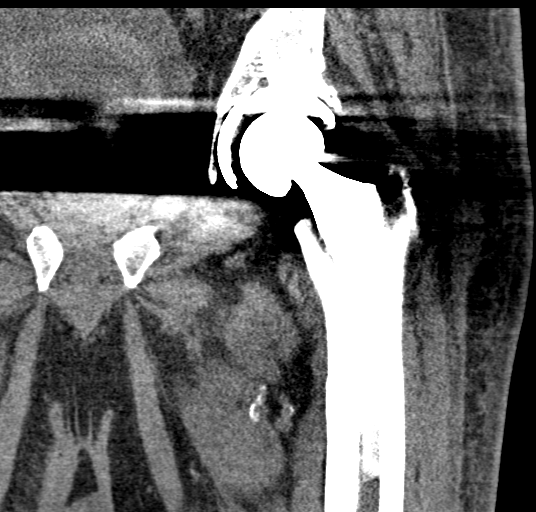

[17 of 46 positions shown; findings below may reference images not displayed]

FINDINGS: Bones/Joint/Cartilage

The patient is status post left total hip arthroplasty. No
periprosthetic lucency or fracture is seen. No hip joint effusion is
noted. The femoral head component is well seated within the
acetabular component.

Ligaments

Suboptimally assessed by CT.

Muscles and Tendons

Head are within or adjacent to the tensor fascia lata there is a
multilocular fluid collection containing foci of air measuring
approximately 4.7 x 5.3 by 9.7 cm extending along the anterolateral
leg. There is mild fatty atrophy of the remainder of the muscles
surrounding the hip. Limited visualization of the tendons is seen.
The previously seen multilocular collection along the posterior hip
is no longer identified.

Soft tissues

There appears to be a hyperdense material layering in the posterior
bladder which could represent layering blood products. The bladder
appears to be markedly distended. Surgical suture seen along the
lower left anterior inguinal wall. There is subcutaneous edema along
the lateral aspect of the hip.
IMPRESSION: 1. There remains a multilocular complex air and fluid collection
within the anterior hip measuring approximately 4.7 x 5.3 x 9.7 cm
extending along the anterior thigh, concerning for multilocular
abscess.
2. Interval resolution of the posterior multilocular collection seen
on prior CT of [DATE] [DATE] [DATE].
[DATE]. Hyperdense probable blood products within the posterior bladder
with a markedly distended bladder.
